# Patient Record
Sex: Female | Born: 1940 | Race: White | Hispanic: No | Smoking: Never smoker
Health system: Southern US, Community
[De-identification: ages and names within clinical notes are randomized; demographics above are authoritative.]

## PROBLEM LIST (undated history)

## (undated) DIAGNOSIS — K449 Diaphragmatic hernia without obstruction or gangrene: Secondary | ICD-10-CM

## (undated) DIAGNOSIS — I10 Essential (primary) hypertension: Secondary | ICD-10-CM

## (undated) DIAGNOSIS — Z87440 Personal history of urinary (tract) infections: Secondary | ICD-10-CM

## (undated) DIAGNOSIS — K5790 Diverticulosis of intestine, part unspecified, without perforation or abscess without bleeding: Secondary | ICD-10-CM

## (undated) DIAGNOSIS — E079 Disorder of thyroid, unspecified: Secondary | ICD-10-CM

## (undated) DIAGNOSIS — IMO0002 Reserved for concepts with insufficient information to code with codable children: Secondary | ICD-10-CM

## (undated) DIAGNOSIS — M1990 Unspecified osteoarthritis, unspecified site: Secondary | ICD-10-CM

## (undated) DIAGNOSIS — R32 Unspecified urinary incontinence: Secondary | ICD-10-CM

## (undated) DIAGNOSIS — K224 Dyskinesia of esophagus: Secondary | ICD-10-CM

## (undated) DIAGNOSIS — R51 Headache: Secondary | ICD-10-CM

## (undated) DIAGNOSIS — K469 Unspecified abdominal hernia without obstruction or gangrene: Secondary | ICD-10-CM

## (undated) DIAGNOSIS — C50919 Malignant neoplasm of unspecified site of unspecified female breast: Secondary | ICD-10-CM

## (undated) DIAGNOSIS — R011 Cardiac murmur, unspecified: Secondary | ICD-10-CM

## (undated) DIAGNOSIS — K219 Gastro-esophageal reflux disease without esophagitis: Secondary | ICD-10-CM

## (undated) DIAGNOSIS — E039 Hypothyroidism, unspecified: Secondary | ICD-10-CM

## (undated) DIAGNOSIS — J189 Pneumonia, unspecified organism: Secondary | ICD-10-CM

## (undated) HISTORY — DX: Diverticulosis of intestine, part unspecified, without perforation or abscess without bleeding: K57.90

## (undated) HISTORY — DX: Dyskinesia of esophagus: K22.4

## (undated) HISTORY — PX: KNEE SURGERY: SHX244

## (undated) HISTORY — PX: BREAST LUMPECTOMY: SHX2

## (undated) HISTORY — DX: Unspecified osteoarthritis, unspecified site: M19.90

## (undated) HISTORY — DX: Diaphragmatic hernia without obstruction or gangrene: K44.9

## (undated) HISTORY — DX: Disorder of thyroid, unspecified: E07.9

## (undated) HISTORY — PX: HIATAL HERNIA REPAIR: SHX195

## (undated) HISTORY — PX: TONSILLECTOMY: SUR1361

## (undated) HISTORY — DX: Reserved for concepts with insufficient information to code with codable children: IMO0002

## (undated) HISTORY — PX: APPENDECTOMY: SHX54

## (undated) HISTORY — DX: Essential (primary) hypertension: I10

## (undated) HISTORY — PX: OTHER SURGICAL HISTORY: SHX169

## (undated) HISTORY — PX: BRONCHOSCOPY: SUR163

## (undated) HISTORY — PX: EYE SURGERY: SHX253

## (undated) HISTORY — DX: Unspecified urinary incontinence: R32

## (undated) HISTORY — DX: Malignant neoplasm of unspecified site of unspecified female breast: C50.919

## (undated) HISTORY — DX: Unspecified abdominal hernia without obstruction or gangrene: K46.9

---

## 2010-04-27 ENCOUNTER — Encounter: Admission: RE | Admit: 2010-04-27 | Discharge: 2010-04-27 | Payer: Self-pay | Admitting: Radiology

## 2010-05-09 ENCOUNTER — Encounter: Admission: RE | Admit: 2010-05-09 | Discharge: 2010-05-09 | Payer: Self-pay | Admitting: General Surgery

## 2010-05-09 DIAGNOSIS — C50919 Malignant neoplasm of unspecified site of unspecified female breast: Secondary | ICD-10-CM

## 2010-05-09 HISTORY — DX: Malignant neoplasm of unspecified site of unspecified female breast: C50.919

## 2010-05-11 ENCOUNTER — Ambulatory Visit (HOSPITAL_BASED_OUTPATIENT_CLINIC_OR_DEPARTMENT_OTHER): Admission: RE | Admit: 2010-05-11 | Discharge: 2010-05-11 | Payer: Self-pay | Admitting: General Surgery

## 2010-05-25 ENCOUNTER — Ambulatory Visit: Payer: Self-pay | Admitting: Oncology

## 2010-06-07 ENCOUNTER — Ambulatory Visit: Admission: RE | Admit: 2010-06-07 | Discharge: 2010-08-17 | Payer: Self-pay | Admitting: Radiation Oncology

## 2010-06-07 LAB — COMPREHENSIVE METABOLIC PANEL
AST: 20 U/L (ref 0–37)
Albumin: 4.1 g/dL (ref 3.5–5.2)
Alkaline Phosphatase: 66 U/L (ref 39–117)
CO2: 28 mEq/L (ref 19–32)
Chloride: 104 mEq/L (ref 96–112)
Glucose, Bld: 36 mg/dL — CL (ref 70–99)
Potassium: 4.5 mEq/L (ref 3.5–5.3)
Sodium: 141 mEq/L (ref 135–145)
Total Bilirubin: 0.4 mg/dL (ref 0.3–1.2)

## 2010-06-07 LAB — CBC WITH DIFFERENTIAL/PLATELET
BASO%: 1 % (ref 0.0–2.0)
Basophils Absolute: 0 10*3/uL (ref 0.0–0.1)
EOS%: 3.6 % (ref 0.0–7.0)
Eosinophils Absolute: 0.2 10*3/uL (ref 0.0–0.5)
LYMPH%: 29.6 % (ref 14.0–49.7)
MCH: 29.7 pg (ref 25.1–34.0)
MCHC: 33.3 g/dL (ref 31.5–36.0)
MCV: 89.3 fL (ref 79.5–101.0)
NEUT#: 2.7 10*3/uL (ref 1.5–6.5)
RBC: 4.89 10*6/uL (ref 3.70–5.45)
WBC: 4.8 10*3/uL (ref 3.9–10.3)

## 2010-06-07 LAB — CANCER ANTIGEN 27.29: CA 27.29: 26 U/mL (ref 0–39)

## 2010-06-24 ENCOUNTER — Ambulatory Visit: Payer: Self-pay | Admitting: Oncology

## 2010-09-26 ENCOUNTER — Ambulatory Visit: Payer: Self-pay | Admitting: Oncology

## 2010-09-27 LAB — CBC WITH DIFFERENTIAL/PLATELET
Eosinophils Absolute: 0.2 10*3/uL (ref 0.0–0.5)
HGB: 13.2 g/dL (ref 11.6–15.9)
LYMPH%: 24.1 % (ref 14.0–49.7)
MCH: 30 pg (ref 25.1–34.0)
MONO#: 0.3 10*3/uL (ref 0.1–0.9)
MONO%: 9.2 % (ref 0.0–14.0)
NEUT#: 2.3 10*3/uL (ref 1.5–6.5)
NEUT%: 61.8 % (ref 38.4–76.8)
Platelets: 186 10*3/uL (ref 145–400)
WBC: 3.7 10*3/uL — ABNORMAL LOW (ref 3.9–10.3)
lymph#: 0.9 10*3/uL (ref 0.9–3.3)

## 2010-09-28 LAB — COMPREHENSIVE METABOLIC PANEL
ALT: 12 U/L (ref 0–35)
AST: 19 U/L (ref 0–37)
Albumin: 3.8 g/dL (ref 3.5–5.2)
BUN: 19 mg/dL (ref 6–23)
Calcium: 9 mg/dL (ref 8.4–10.5)
Creatinine, Ser: 0.93 mg/dL (ref 0.40–1.20)
Potassium: 4.1 mEq/L (ref 3.5–5.3)

## 2010-12-23 LAB — POCT I-STAT, CHEM 8
Calcium, Ion: 1.15 mmol/L (ref 1.12–1.32)
Chloride: 108 mEq/L (ref 96–112)
Creatinine, Ser: 1 mg/dL (ref 0.4–1.2)
HCT: 46 % (ref 36.0–46.0)
Hemoglobin: 15.6 g/dL — ABNORMAL HIGH (ref 12.0–15.0)
Potassium: 4.1 mEq/L (ref 3.5–5.1)
Sodium: 141 mEq/L (ref 135–145)
TCO2: 24 mmol/L (ref 0–100)

## 2010-12-27 ENCOUNTER — Other Ambulatory Visit: Payer: Self-pay | Admitting: Family Medicine

## 2010-12-27 ENCOUNTER — Ambulatory Visit
Admission: RE | Admit: 2010-12-27 | Discharge: 2010-12-27 | Disposition: A | Discharge status: Home / Self Care | Payer: Medicare Other | Source: Ambulatory Visit | Attending: Family Medicine | Admitting: Family Medicine

## 2010-12-27 DIAGNOSIS — R911 Solitary pulmonary nodule: Secondary | ICD-10-CM

## 2010-12-27 MED ORDER — IOHEXOL 300 MG/ML  SOLN
75.0000 mL | Freq: Once | INTRAMUSCULAR | Status: AC | PRN
  Administered 2010-12-27: 75 mL via INTRAVENOUS

## 2010-12-28 ENCOUNTER — Telehealth: Payer: Self-pay | Admitting: Internal Medicine

## 2010-12-28 NOTE — Telephone Encounter (Signed)
Spoke with TD and ok to schedule pt with Dr. Vassie Loll tomorrow at [facility] with Morrie Sheldon at [facility] family practice and advised of appt time and date. She will notify the pt. I will have Raliegh Scarlet add pt to schedule. Carron Curie, MA

## 2010-12-29 ENCOUNTER — Encounter: Payer: Self-pay | Admitting: Pulmonary Disease

## 2010-12-29 ENCOUNTER — Ambulatory Visit (INDEPENDENT_AMBULATORY_CARE_PROVIDER_SITE_OTHER): Payer: Medicare Other | Admitting: Pulmonary Disease

## 2010-12-29 VITALS — BP 120/78 | HR 116 | Temp 98.7°F | Ht 61.0 in | Wt 173.2 lb

## 2010-12-29 DIAGNOSIS — J189 Pneumonia, unspecified organism: Secondary | ICD-10-CM | POA: Insufficient documentation

## 2010-12-29 NOTE — Progress Notes (Signed)
  Subjective:    Patient ID: Joan Weaver, female    DOB: 10-22-1940, 70 y.o.   MRN: [id number]  HPI Pleasant [name], never smoker for evaluaiton of pneumonia since Feb '12 not responding to treatment. Recently diagnosed with breast CA in July '11, underwent lumpectomy ([facility]) followed by RT , last nov'11.  Presented 11/30/10 with 2 weeks of fatigue, cough with green phlegm, diarrhea & 1 episode of BRBPR, CXR s/o LUL pna >> given rocephin 1 gm IM & avelox x 10 ds FU CXR 3/19 s/o LUL cavitation & new RUL infiltrate 3/21 CT chest  >> 39 x 47 mm LUL consolidation, additional focus RLL sup segment 26 x 20 mm, scattered small ground glass attenuation in pulm nodules She c/o continued fevers, cough, night sweats & shotrness of breath  Review of Systems  Constitutional: Positive for diaphoresis and appetite change. Negative for fever, chills and unexpected weight change.  HENT: Negative for mouth sores, voice change and postnasal drip.   Eyes: Negative for discharge.  Respiratory: Positive for cough and wheezing. Negative for chest tightness and shortness of breath.   Cardiovascular: Negative for chest pain, palpitations and leg swelling.  Gastrointestinal: Positive for diarrhea. Negative for abdominal pain.  Genitourinary: Negative for dysuria and frequency.  Musculoskeletal: Negative for joint swelling.  Skin: Negative for rash.  Neurological: Negative for syncope and headaches.  Psychiatric/Behavioral: Negative for confusion. The patient is not nervous/anxious.        Objective:   Physical Exam Gen. Pleasant, well-nourished, in no distress,  normal affect,  anxious ENT - no lesions, no post nasal drip Neck: No JVD, no thyromegaly, no carotid bruits Lungs: no use of accessory muscles, no dullness to percussion, rt suprascapular rales, no rhonchi  Cardiovascular: Rhythm regular, heart sounds  normal, no murmurs, no peripheral edema Abdomen: soft and non-tender, no hepatosplenomegaly, BS  normal. Musculoskeletal: No deformities, no cyanosis or clubbing Neuro:  alert, non focal Skin:  Warm, no lesions/ rash     Assessment & Plan:

## 2010-12-29 NOTE — Assessment & Plan Note (Signed)
Unclear infectious or inflammatory. Did not respond to fluoroquinolone. DD includes resitatnt organism or inflamm etiology such as BOOP or radiation pneumonitis, doubt aspiration - no risk factors, doubt  Will proceed with bronchoscopy The risks of the procedure including coughing, bleeding and the small chance of lung puncture requiring chest tube were discussed in great detail. The benefits & alternatives including serial follow up were also discussed. Will also chk for afb, fungal & cytology If negative, will treat empirically with steroids

## 2010-12-29 NOTE — Patient Instructions (Signed)
Bronchoscopy arranged for Friday 3/23 - 7am - Go to 1st floor - ADMITTING to register at [facility] Nothing to eat after midnight tonight The risks of the procedure including coughing, bleeding and the small chance of lung puncture requiring chest tube were discussed in great detail. The benefits & alternatives including serial follow up were also discussed. Arrange FU in 1 week Stay on antibiotic until next visit We will call you with results of bronchoscopy & discuss about need for steroids

## 2010-12-30 ENCOUNTER — Other Ambulatory Visit: Payer: Self-pay | Admitting: Pulmonary Disease

## 2010-12-30 ENCOUNTER — Inpatient Hospital Stay (HOSPITAL_COMMUNITY): Payer: Medicare Other

## 2010-12-30 ENCOUNTER — Inpatient Hospital Stay (HOSPITAL_COMMUNITY)
Admission: AD | Admit: 2010-12-30 | Discharge: 2011-01-01 | DRG: 199 | Disposition: A | Discharge status: Home / Self Care | Payer: Medicare Other | Source: Ambulatory Visit | Attending: Pulmonary Disease | Admitting: Pulmonary Disease

## 2010-12-30 ENCOUNTER — Ambulatory Visit (HOSPITAL_COMMUNITY)
Admission: RE | Admit: 2010-12-30 | Discharge: 2010-12-30 | Disposition: A | Discharge status: Home / Self Care | Payer: Medicare Other | Source: Ambulatory Visit | Attending: Pulmonary Disease | Admitting: Pulmonary Disease

## 2010-12-30 DIAGNOSIS — Z923 Personal history of irradiation: Secondary | ICD-10-CM

## 2010-12-30 DIAGNOSIS — J984 Other disorders of lung: Secondary | ICD-10-CM

## 2010-12-30 DIAGNOSIS — C50919 Malignant neoplasm of unspecified site of unspecified female breast: Secondary | ICD-10-CM | POA: Diagnosis present

## 2010-12-30 DIAGNOSIS — I498 Other specified cardiac arrhythmias: Secondary | ICD-10-CM | POA: Diagnosis not present

## 2010-12-30 DIAGNOSIS — Y9210 Unspecified residential institution as the place of occurrence of the external cause: Secondary | ICD-10-CM | POA: Diagnosis present

## 2010-12-30 DIAGNOSIS — J95811 Postprocedural pneumothorax: Secondary | ICD-10-CM

## 2010-12-30 DIAGNOSIS — J189 Pneumonia, unspecified organism: Secondary | ICD-10-CM | POA: Diagnosis present

## 2010-12-30 DIAGNOSIS — E039 Hypothyroidism, unspecified: Secondary | ICD-10-CM | POA: Diagnosis present

## 2010-12-30 DIAGNOSIS — I10 Essential (primary) hypertension: Secondary | ICD-10-CM | POA: Diagnosis present

## 2010-12-30 DIAGNOSIS — Y849 Medical procedure, unspecified as the cause of abnormal reaction of the patient, or of later complication, without mention of misadventure at the time of the procedure: Secondary | ICD-10-CM | POA: Diagnosis present

## 2010-12-30 LAB — BASIC METABOLIC PANEL
CO2: 25 mEq/L (ref 19–32)
Calcium: 8.6 mg/dL (ref 8.4–10.5)
Chloride: 104 mEq/L (ref 96–112)
Creatinine, Ser: 0.77 mg/dL (ref 0.4–1.2)
GFR calc non Af Amer: >60 mL/min (ref >60)

## 2010-12-30 LAB — SEDIMENTATION RATE: Sed Rate: 71 mm/hr — ABNORMAL HIGH (ref 0–22)

## 2010-12-30 LAB — PNEUMOCYSTIS JIROVECI SMEAR BY DFA

## 2010-12-30 LAB — CBC
HCT: 35.8 % — ABNORMAL LOW (ref 36.0–46.0)
Platelets: 354 10*3/uL (ref 150–400)
RBC: 4.12 MIL/uL (ref 3.87–5.11)
WBC: 9.4 10*3/uL (ref 4.0–10.5)

## 2010-12-30 LAB — MRSA PCR SCREENING: MRSA by PCR: NEGATIVE

## 2010-12-30 NOTE — Op Note (Signed)
  Joan Weaver, Joan Weaver                 ACCOUNT NO.:  [id number]  MEDICAL RECORD NO.:  [id number]           PATIENT TYPE:  O  LOCATION:  XRAY                         FACILITY:  [facility]  PHYSICIAN:  Oretha Milch, MD      DATE OF BIRTH:  [birth year]  DATE OF PROCEDURE: DATE OF DISCHARGE:                              OPERATIVE REPORT   PROCEDURE PERFORMED:  Video bronchoscopy with biopsies.  INDICATIONS FOR PROCEDURE:  Left upper lobe and right lower lobe superior segment consolidation in this 70 year old never smoker with recent radiation status post breast cancer, diagnosed last in December 2011 and not responding to antibiotics.  Written informed consent was obtained from the patient prior to the procedure.  Rest of the procedure including coughing, bleeding and a small chance of lung puncture, requiring a chest tube, were discussed in detail and she evidenced understanding.  PROCEDURE IN DETAIL:  3 mg Versed and 75 mcg of fentanyl were used in divided doses during procedure.  Bronchoscope was introduced from the left naris.  Upper airway appeared normal.  Vocal cords showed normal appearance and motion.  The tracheobronchial tree was then inspected up to the subsegmental level.  No endobronchial lesions were noted. Minimal phlegm was noted in the left and right upper lobe.  Attention was then turned to the left upper lobe.  Bronchoalveolar lavage and brushings were obtained from left upper lobe anterior segment. Transbronchial biopsies x 2 were obtained from the anterior segment and the apical posterior segment under fluoroscopy.  The patient tolerated procedure well with minimal bleeding.  She was awake right after procedure.  Chest x-ray will be performed to rule out presence of pneumothorax.     Oretha Milch, MD     RVA/MEDQ  D:  12/30/2010  T:  12/30/2010  Job:  [id number]  Electronically Signed by Cyril Mourning MD on 12/30/2010 04:54:09 PM

## 2010-12-31 ENCOUNTER — Inpatient Hospital Stay (HOSPITAL_COMMUNITY): Payer: Medicare Other

## 2010-12-31 LAB — CBC
Hemoglobin: 11.2 g/dL — ABNORMAL LOW (ref 12.0–15.0)
MCV: 87.2 fL (ref 78.0–100.0)
Platelets: 326 10*3/uL (ref 150–400)
RBC: 4.14 MIL/uL (ref 3.87–5.11)
WBC: 6 10*3/uL (ref 4.0–10.5)

## 2010-12-31 LAB — PROCALCITONIN: Procalcitonin: <0.1 ng/mL

## 2010-12-31 LAB — BASIC METABOLIC PANEL
CO2: 27 mEq/L (ref 19–32)
Chloride: 104 mEq/L (ref 96–112)
GFR calc Af Amer: >60 mL/min (ref >60)
Potassium: 4.7 mEq/L (ref 3.5–5.1)

## 2011-01-01 ENCOUNTER — Inpatient Hospital Stay (HOSPITAL_COMMUNITY): Payer: Medicare Other

## 2011-01-02 ENCOUNTER — Telehealth: Payer: Self-pay | Admitting: Pulmonary Disease

## 2011-01-02 MED ORDER — PREDNISONE 10 MG PO TABS: 40.0000 mg | ORAL_TABLET | Freq: Every day | ORAL | Status: DC

## 2011-01-02 NOTE — Telephone Encounter (Signed)
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

## 2011-01-02 NOTE — Telephone Encounter (Signed)
Let her know that biopsy showed changes of radiation pneumonitis

## 2011-01-04 LAB — LEGIONELLA PROFILE(CULTURE+DFA/SMEAR): Legionella Antigen (DFA): NEGATIVE

## 2011-01-06 ENCOUNTER — Encounter: Payer: Self-pay | Admitting: Pulmonary Disease

## 2011-01-06 ENCOUNTER — Ambulatory Visit (INDEPENDENT_AMBULATORY_CARE_PROVIDER_SITE_OTHER): Payer: Medicare Other | Admitting: Pulmonary Disease

## 2011-01-06 ENCOUNTER — Ambulatory Visit (INDEPENDENT_AMBULATORY_CARE_PROVIDER_SITE_OTHER)
Admission: RE | Admit: 2011-01-06 | Discharge: 2011-01-06 | Disposition: A | Discharge status: Home / Self Care | Payer: Medicare Other | Source: Ambulatory Visit | Attending: Pulmonary Disease | Admitting: Pulmonary Disease

## 2011-01-06 VITALS — BP 110/74 | HR 111 | Temp 98.0°F | Ht 61.0 in | Wt 172.6 lb

## 2011-01-06 DIAGNOSIS — J95811 Postprocedural pneumothorax: Secondary | ICD-10-CM | POA: Insufficient documentation

## 2011-01-06 DIAGNOSIS — J189 Pneumonia, unspecified organism: Secondary | ICD-10-CM

## 2011-01-06 MED ORDER — PREDNISONE 10 MG PO TABS: ORAL_TABLET | ORAL | Status: AC

## 2011-01-06 NOTE — Patient Instructions (Signed)
You have radiation induced pneumonia Chest xray today Prednisone 10 mg tabs - Take 4 tabs  daily with food x 4 days, then 3 tabs daily x 4 days, then 2 tabs daily until seen again Take DELSYM 2 tsp three times daily as needed for cough Apply neosporin cram/ powder to wound site

## 2011-01-06 NOTE — Assessment & Plan Note (Addendum)
Reviewed bscopy data >> cx neg for afb, fungal TBBX s/o radiation pneumonitis , no evidence of BOOP Start prednisone 40 mg & taper down to 20 mg  - stay on this dose until subjective improvement - anticipate 2-3 months of therapy, if tolerated Side effects discussed Delsym prn cough

## 2011-01-06 NOTE — Telephone Encounter (Signed)
Pt informed during today's OV. Julaine Hua, CMA

## 2011-01-06 NOTE — Progress Notes (Signed)
  Subjective:    Patient ID: Joan Weaver, female    DOB: Jun 02, 1941, 70 y.o.   MRN: [id number]  HPI Pleasant [name], never smoker for evaluaiton of pneumonia since Feb '12 not responding to treatment. Recently diagnosed with breast CA in July '11, underwent lumpectomy ([facility]) followed by RT , last nov'11.  Presented 11/30/10 with 2 weeks of fatigue, cough with green phlegm, diarrhea & 1 episode of BRBPR, CXR s/o LUL pna >> given rocephin 1 gm IM & avelox x 10 ds FU CXR 3/19 s/o LUL cavitation & new RUL infiltrate 3/21 CT chest  >> 39 x 47 mm LUL consolidation, additional focus RLL sup segment 26 x 20 mm, scattered small ground glass attenuation in pulm nodules  01/06/2011 Discussed bronchoscopy results, reviewed last CXR She c/o continued fevers, cough, night sweats & shotrness of breath    Review of Systems Review of Systems  Constitutional: Positive for diaphoresis and appetite change. Negative for fever, chills and unexpected weight change.  HENT: Negative for mouth sores, voice change and postnasal drip.   Eyes: Negative for discharge.  Respiratory: Positive for cough and wheezing. Negative for chest tightness and shortness of breath.   Cardiovascular: Negative for chest pain, palpitations and leg swelling.  Gastrointestinal: Positive for diarrhea. Negative for abdominal pain.  Genitourinary: Negative for dysuria and frequency.  Musculoskeletal: Negative for joint swelling.  Skin: Negative for rash.  Neurological: Negative for syncope and headaches.  Psychiatric/Behavioral: Negative for confusion. The patient is not nervous/anxious.        Objective:   Physical Exam  Physical Exam Gen. Pleasant, well-nourished, in no distress,  normal affect,  anxious ENT - no lesions, no post nasal drip Neck: No JVD, no thyromegaly, no carotid bruits Lungs: no use of accessory muscles, no dullness to percussion, lt suprascapular rales, no rhonchi , chest tube site - no  purulence Cardiovascular: Rhythm regular, heart sounds  normal, no murmurs, no peripheral edema Abdomen: soft and non-tender, no hepatosplenomegaly, BS normal. Musculoskeletal: No deformities, no cyanosis or clubbing Neuro:  alert, non focal Skin:  Warm, no lesions/ rash       Assessment & Plan:

## 2011-01-06 NOTE — Assessment & Plan Note (Signed)
FU CXR today - 5% residual pnthx on last CXR on 3/25 after removing tube but good air entry on exam today Neosporin at chest tube site

## 2011-01-20 ENCOUNTER — Ambulatory Visit (INDEPENDENT_AMBULATORY_CARE_PROVIDER_SITE_OTHER): Payer: Medicare Other | Admitting: Adult Health

## 2011-01-20 ENCOUNTER — Ambulatory Visit (INDEPENDENT_AMBULATORY_CARE_PROVIDER_SITE_OTHER)
Admission: RE | Admit: 2011-01-20 | Discharge: 2011-01-20 | Disposition: A | Discharge status: Home / Self Care | Payer: Medicare Other | Source: Ambulatory Visit | Attending: Adult Health | Admitting: Adult Health

## 2011-01-20 ENCOUNTER — Other Ambulatory Visit (INDEPENDENT_AMBULATORY_CARE_PROVIDER_SITE_OTHER): Payer: Medicare Other

## 2011-01-20 ENCOUNTER — Encounter: Payer: Self-pay | Admitting: Adult Health

## 2011-01-20 VITALS — BP 130/84 | HR 77 | Temp 97.8°F | Ht 61.0 in | Wt 176.4 lb

## 2011-01-20 DIAGNOSIS — J189 Pneumonia, unspecified organism: Secondary | ICD-10-CM

## 2011-01-20 DIAGNOSIS — J95811 Postprocedural pneumothorax: Secondary | ICD-10-CM

## 2011-01-20 MED ORDER — PREDNISONE 10 MG PO TABS: ORAL_TABLET | ORAL | Status: DC

## 2011-01-20 NOTE — Progress Notes (Signed)
  Subjective:    Patient ID: Joan Weaver, female    DOB: 1940-10-30, 70 y.o.   MRN: [id number]  HPI  Pleasant [name], never smoker for evaluaiton of pneumonia since Feb '12 not responding to treatment. Recently diagnosed with breast CA in July '11, underwent lumpectomy ([facility]) followed by RT , last nov'11.  Presented 11/30/10 with 2 weeks of fatigue, cough with green phlegm, diarrhea & 1 episode of BRBPR, CXR s/o LUL pna >> given rocephin 1 gm IM & avelox x 10 ds FU CXR 3/19 s/o LUL cavitation & new RUL infiltrate 3/21 CT chest  >> 39 x 47 mm LUL consolidation, additional focus RLL sup segment 26 x 20 mm, scattered small ground glass attenuation in pulm nodules  01/06/2011 Discussed bronchoscopy results, reviewed last CXR She c/o continued fevers, cough, night sweats & shotrness of breath>decreased prednisone to 20mg   FOB path c/w with radiation pneumonitis-neg for BOOP, CXR showed resolved Pneumothorax   01/20/2011 Follow up  Pt returns today for 2 week follow up. She is feeling much better with decreased cough and dyspnea. Still gets winded with prolonged walking but energy level is picking up slightly. Cough is almost gone. No n/v/d. She has decreased Prednisone to 20mg  daily.  CXR last ov showed resolved Pnuemothorax. CHest wall pain is resolved.  Fever and chills/night sweats are gone.     Review of Systems  Review of Systems  Constitutional:neg for fever, chills and unexpected weight change.  HENT: Negative for mouth sores, voice change and postnasal drip.   Eyes: Negative for discharge.  Respiratory: Decreased cough. Neg wheezing. Negative for chest tightness and shortness of breath.   Cardiovascular: Negative for chest pain, palpitations and leg swelling.  Gastrointestinal: Neg diarrhea. Negative for abdominal pain.  Genitourinary: Negative for dysuria and frequency.  Musculoskeletal: Negative for joint swelling.  Skin: Negative for rash.  Neurological: Negative for  syncope and headaches.  Psychiatric/Behavioral: Negative for confusion. The patient is not nervous/anxious.        Objective:   Physical Exam  Gen. Pleasant, well-nourished, in no distress,  normal affect,  anxious ENT - no lesions, no post nasal drip Neck: No JVD, no thyromegaly, no carotid bruits Lungs: no use of accessory muscles, no dullness to percussion, lt suprascapular rales, no rhonchi ,  Cardiovascular: Rhythm regular, heart sounds  normal, no murmurs, no peripheral edema Abdomen: soft and non-tender, no hepatosplenomegaly, BS normal. Musculoskeletal: No deformities, no cyanosis or clubbing Neuro:  alert, non focal Skin:  Warm, no lesions/ rash       Assessment & Plan:

## 2011-01-20 NOTE — Patient Instructions (Signed)
Continue on Prednisone 20mg  daily for 2 weeks then alternate 20mg  and 10mg  and hold at this dose follow up Dr. Vassie Loll  In 3-4 weeks  I will call with xray results.

## 2011-01-20 NOTE — Assessment & Plan Note (Addendum)
Radiation Pneumonitis- clinically improving bscopy data >> cx neg for afb, fungal TBBX s/o radiation pneumonitis , no evidence of BOOP Previous ESR 71.  Will repeat xray and ESR.  Taper prednisone slowly with plan of steroids x 2-3 months  Plan  Continue on Prednisone 20mg  daily for 2 weeks then alternate 20mg  and 10mg  and hold at this dose follow up Dr. Vassie Loll  In 3-4 weeks  I will call with xray /labs results.

## 2011-01-20 NOTE — Assessment & Plan Note (Signed)
Resolved on xray last ov.

## 2011-01-30 LAB — FUNGUS CULTURE W SMEAR: Fungal Smear: NONE SEEN

## 2011-02-01 NOTE — Discharge Summary (Signed)
NAMEMISSY, BAKSH                 ACCOUNT NO.:  [id number]  MEDICAL RECORD NO.:  [id number]           PATIENT TYPE:  O  LOCATION:  XRAY                         FACILITY:  [facility]  PHYSICIAN:  Oley Balm. Sung Amabile, MD   DATE OF BIRTH:  12/13/1940  DATE OF ADMISSION:  12/30/2010 DATE OF DISCHARGE:  01/01/2011                              DISCHARGE SUMMARY   FINAL DIAGNOSES: 1. Iatrogenic pneumothorax following bronchoscopy. 2. Pain. 3. Pneumonia.  PROCEDURES:  Left chest tube.  This was placed on December 30, 2010, removed on January 01, 2011.  LABORATORY DATA:  Legionella profile was negative by bronchioalveolar lavage.  ANA was negative, this on December 30, 2010.  Fungal was negative on December 30, 2010.  BAL on December 30, 2010, demonstrated nonpathogenic oropharyngeal-type flora.  Procalcitonin on December 31, 2010, was less than 0.10.  Pneumocystis was negative on December 30, 2010.  ESR was 71.  BRIEF HISTORY:  A 70 year old female patient seen in the outpatient setting by Dr. Vassie Loll in consultation for evaluation of abnormal chest x- ray, which was being treated as a pneumonia in the outpatient setting since February 2012 without improvement.  She underwent a diagnostic fiberoptic bronchoscopy on December 30, 2010, and postoperatively was found to have a large left-sided pneumothorax.  She was monitored postoperatively in the endoscopy suite, however, the pneumothorax increased in size, she therefore was admitted to the step-down unit for chest tube insertion and decompression of pneumothorax.  HOSPITAL COURSE BY DISCHARGE DIAGNOSES: 1. Iatrogenic pneumothorax following bronchoscopy.  Ms. Sculley     underwent left-sided thoracostomy tube placement on hospital admit     day on December 29, 2009.  Following that she had resolution of her     pneumothorax, with improvement of her symptoms.  Chest tube was     successfully removed on January 01, 2011, with less than 5%     pneumothorax on the left, this  is following removal of the chest     tube on the 24th.  She was discharged to home with instructions to     continue antibiotics for treatment of presumed pneumonia.  She had     been started on Levaquin in the outpatient setting as well as     instructions to follow with Dr. Vassie Loll on the 29th.  Her discharge medications are as follows: 1. Vitamin B6, 100 mg p.o. daily. 2. Zantac 150 mg p.o. nightly. 3. Fish oil 1000 mg p.o. daily. 4. Nexium 40 mg daily. 5. Vitamin E 400 units daily. 6. Flaxseed oil 1 cap daily. 7. Multivitamin daily. 8. Magnesium 100 mg p.o. daily. 9. Loratadine 10 mg p.o. daily. 10.Etodolac 500 mg p.o. daily. 11.Cranberry over-the-counter 1 tab daily. 12.Vitamin D3, 1000 units daily. 13.Calcium 1 tablet daily. 14.Extra Strength Tylenol 500 mg p.o. daily. 15.Tamoxifen 20 mg daily. 16.Levothyroxine 25 mcg daily. 17.Levofloxacin 750 mg p.o. daily until gone. 18.Fluconazole 150 mg x1. 19.Advair 250/50 one puff b.i.d.  DISPOSITION:  Ms. Nicholls met maximum benefit from inpatient care.  She is medically cleared for discharge and was discharged to home with follow with Dr. Cyril Mourning  on the 29th.     Zenia Resides, NP   ______________________________ Oley Balm. Sung Amabile, MD    PB/MEDQ  D:  01/23/2011  T:  01/24/2011  Job:  [id number]  Electronically Signed by Zenia Resides NP on 01/31/2011 09:05:44 AM Electronically Signed by Billy Fischer MD on 02/01/2011 12:59:44 AM

## 2011-02-03 ENCOUNTER — Encounter (INDEPENDENT_AMBULATORY_CARE_PROVIDER_SITE_OTHER): Payer: Self-pay | Admitting: General Surgery

## 2011-02-08 ENCOUNTER — Encounter: Payer: Self-pay | Admitting: Pulmonary Disease

## 2011-02-10 ENCOUNTER — Ambulatory Visit (INDEPENDENT_AMBULATORY_CARE_PROVIDER_SITE_OTHER): Payer: Medicare Other | Admitting: Pulmonary Disease

## 2011-02-10 ENCOUNTER — Encounter: Payer: Self-pay | Admitting: Pulmonary Disease

## 2011-02-10 VITALS — BP 134/78 | HR 78 | Temp 97.8°F | Ht 61.0 in | Wt 180.2 lb

## 2011-02-10 DIAGNOSIS — J189 Pneumonia, unspecified organism: Secondary | ICD-10-CM

## 2011-02-10 MED ORDER — PREDNISONE 5 MG PO TABS: ORAL_TABLET | ORAL | Status: DC

## 2011-02-10 NOTE — Patient Instructions (Signed)
On may 15 th - drop to 10 mg daily On June 1st - drop to 5 mg daily On June 15th -drop to 5 mg daily STOP on July 1st

## 2011-02-10 NOTE — Progress Notes (Signed)
  Subjective:    Patient ID: Joan Weaver, female    DOB: August 03, 1941, 70 y.o.   MRN: [id number]  HPI Oncologist : Donnie Coffin,  Dr Mitzi Hansen  Pleasant [name], never smoker for evaluaiton of pneumonia since Feb '12 not responding to treatment.  Diagnosed with breast CA in July '11, underwent lumpectomy ([facility]) followed by RT , last nov'11.  Presented 11/30/10 with 2 weeks of fatigue, cough with green phlegm, diarrhea & 1 episode of BRBPR, CXR s/o LUL pna >> given rocephin 1 gm IM & avelox x 10 ds  FU CXR 3/19 s/o LUL cavitation & new RUL infiltrate  3/21 CT chest >> 39 x 47 mm LUL consolidation, additional focus RLL sup segment 26 x 20 mm, scattered small ground glass attenuation in pulm nodules  01/06/2011  Transbronchial biopsy  c/w with radiation pneumonitis-neg for BOOP, CXR showed resolved Pneumothorax  Started on 40 mg prednisone    02/10/2011 She is feeling much better with decreased cough and dyspnea. Still gets winded with prolonged walking but energy level is picking up slightly. Cough is almost gone. No n/v/d. She has decreased Prednisone to 20mg  alternating with 10 mg daily.  CXR last ov showed resolved Pnuemothorax. CHest wall pain is resolved.  Fever and chills/night sweats are gone.      Review of Systems Pt denies any significant  nasal congestion or excess secretions, fever, chills, sweats, unintended wt loss, pleuritic or exertional cp, orthopnea pnd or leg swelling.  Pt also denies any obvious fluctuation in symptoms with weather or environmental change or other alleviating or aggravating factors.    Pt denies any increase in rescue therapy over baseline, denies waking up needing it or having early am exacerbations or coughing/wheezing/ or dyspnea       Objective:   Physical Exam Gen. Pleasant, well-nourished, in no distress ENT - no lesions, no post nasal drip, steroid facies Neck: No JVD, no thyromegaly, no carotid bruits Lungs: no use of accessory muscles, no dullness to  percussion, clear without rales or rhonchi  Cardiovascular: Rhythm regular, heart sounds  normal, no murmurs or gallops, no peripheral edema Musculoskeletal: No deformities, no cyanosis or clubbing         Assessment & Plan:

## 2011-02-12 LAB — AFB CULTURE WITH SMEAR (NOT AT ARMC): Acid Fast Smear: NONE SEEN

## 2011-02-13 NOTE — Assessment & Plan Note (Signed)
Taper steroids to off over next 2 months as outlined

## 2011-03-21 ENCOUNTER — Other Ambulatory Visit: Payer: Self-pay | Admitting: Oncology

## 2011-03-21 ENCOUNTER — Encounter (HOSPITAL_BASED_OUTPATIENT_CLINIC_OR_DEPARTMENT_OTHER): Payer: Medicare Other | Admitting: Oncology

## 2011-03-21 DIAGNOSIS — M810 Age-related osteoporosis without current pathological fracture: Secondary | ICD-10-CM

## 2011-03-21 DIAGNOSIS — K219 Gastro-esophageal reflux disease without esophagitis: Secondary | ICD-10-CM

## 2011-03-21 DIAGNOSIS — C50419 Malignant neoplasm of upper-outer quadrant of unspecified female breast: Secondary | ICD-10-CM

## 2011-03-21 DIAGNOSIS — I10 Essential (primary) hypertension: Secondary | ICD-10-CM

## 2011-03-21 LAB — CBC WITH DIFFERENTIAL/PLATELET
BASO%: 0.3 % (ref 0.0–2.0)
Basophils Absolute: 0 10*3/uL (ref 0.0–0.1)
HCT: 40.2 % (ref 34.8–46.6)
HGB: 13.4 g/dL (ref 11.6–15.9)
LYMPH%: 7.9 % — ABNORMAL LOW (ref 14.0–49.7)
MCH: 29 pg (ref 25.1–34.0)
MCHC: 33.3 g/dL (ref 31.5–36.0)
MONO#: 0.7 10*3/uL (ref 0.1–0.9)
NEUT%: 84.2 % — ABNORMAL HIGH (ref 38.4–76.8)
Platelets: 237 10*3/uL (ref 145–400)
WBC: 10.6 10*3/uL — ABNORMAL HIGH (ref 3.9–10.3)
lymph#: 0.8 10*3/uL — ABNORMAL LOW (ref 0.9–3.3)

## 2011-03-21 LAB — COMPREHENSIVE METABOLIC PANEL
ALT: 10 U/L (ref 0–35)
BUN: 16 mg/dL (ref 6–23)
CO2: 24 mEq/L (ref 19–32)
Calcium: 8.8 mg/dL (ref 8.4–10.5)
Chloride: 103 mEq/L (ref 96–112)
Creatinine, Ser: 1.03 mg/dL (ref 0.50–1.10)
Total Bilirubin: 0.4 mg/dL (ref 0.3–1.2)

## 2011-03-21 LAB — CANCER ANTIGEN 27.29: CA 27.29: 29 U/mL (ref 0–39)

## 2011-03-22 ENCOUNTER — Telehealth: Payer: Self-pay | Admitting: Pulmonary Disease

## 2011-03-22 NOTE — Telephone Encounter (Signed)
Patient decided to accept the opening tomorrow at 2:00pm stated nurse doesn't have to call her back but she did want this information in her chart.Joan Weaver

## 2011-03-23 ENCOUNTER — Encounter: Payer: Self-pay | Admitting: Pulmonary Disease

## 2011-03-23 ENCOUNTER — Ambulatory Visit (INDEPENDENT_AMBULATORY_CARE_PROVIDER_SITE_OTHER): Payer: Medicare Other | Admitting: Pulmonary Disease

## 2011-03-23 ENCOUNTER — Ambulatory Visit (INDEPENDENT_AMBULATORY_CARE_PROVIDER_SITE_OTHER)
Admission: RE | Admit: 2011-03-23 | Discharge: 2011-03-23 | Disposition: A | Discharge status: Home / Self Care | Payer: Medicare Other | Source: Ambulatory Visit | Attending: Pulmonary Disease | Admitting: Pulmonary Disease

## 2011-03-23 VITALS — BP 132/74 | HR 107 | Temp 98.9°F | Ht 61.0 in | Wt 182.6 lb

## 2011-03-23 DIAGNOSIS — J189 Pneumonia, unspecified organism: Secondary | ICD-10-CM

## 2011-03-23 NOTE — Patient Instructions (Signed)
Xray looks OK We will treat as sinusitis Antibiotic x 5 days (z-pak) Use DELSYM cough syrup 2 tsp thrice daily Stay on prednisone 10 mg daily If better, drop to 5 mg again on July 1 st -  On July 15 th take M/W/F OK to stop sep 1 st

## 2011-03-23 NOTE — Progress Notes (Signed)
  Subjective:    Patient ID: Joan Weaver, female    DOB: 06-Jul-1941, 70 y.o.   MRN: [id number]  HPI Oncologist : Donnie Coffin, Dr Mitzi Hansen   [name], never smoker for FU of radiation pneumonitis. She presented with pneumonia since Feb '12 not responding to treatment.  Diagnosed with breast CA in July '11, underwent lumpectomy ([facility]) followed by RT , last nov'11.  Presented 11/30/10 with 2 weeks of fatigue, cough with green phlegm, diarrhea & 1 episode of BRBPR, CXR s/o LUL pna >> given rocephin 1 gm IM & avelox x 10 ds  FU CXR 3/19 s/o LUL cavitation & new RUL infiltrate  3/21 CT chest >> 39 x 47 mm LUL consolidation, additional focus RLL sup segment 26 x 20 mm, scattered small ground glass attenuation in pulm nodules  01/06/2011  Transbronchial biopsy c/w with radiation pneumonitis-neg for BOOP, CXR showed resolved Pneumothorax  Started on 40 mg prednisone   02/10/2011  She is feeling much better with decreased cough and dyspnea. Still gets winded with prolonged walking but energy level is picking up slightly. Cough is almost gone. No n/v/d. She has decreased Prednisone to 20mg  alternating with 10 mg daily.  CXR last ov showed resolved Pnuemothorax. CHest wall pain is resolved.  Fever and chills/night sweats are gone.   03/23/2011 Tapered down to 5 mg on June 1st as planned - started feeling bad followed by sore throat, sinus drainage & increased cough , back up to 10 mg last 3 days without relief. Pt here for acute visit. c/o increased SOB with exertion, non-productive cough. Also c/o T-100 x last 2 days - afebrile today. CXR improved LUL aeration, persistent LLL air space disease.    Review of Systems Pt denies any significant  nasal congestion or excess secretions, fever, chills, sweats, unintended wt loss, pleuritic or exertional cp, orthopnea pnd or leg swelling.  Pt also denies any obvious fluctuation in symptoms with weather or environmental change or other alleviating or aggravating  factors.    Pt denies any increase in rescue therapy over baseline, denies waking up needing it or having early am exacerbations or coughing/wheezing/ or dyspnea      Objective:   Physical Exam Gen. Pleasant, well-nourished, in no distress ENT - no lesions, no post nasal drip Neck: No JVD, no thyromegaly, no carotid bruits Lungs: no use of accessory muscles, no dullness to percussion, lt suprascapular rales, no rhonchi  Cardiovascular: Rhythm regular, heart sounds  normal, no murmurs or gallops, no peripheral edema Musculoskeletal: No deformities, no cyanosis or clubbing          Assessment & Plan:

## 2011-03-23 NOTE — Assessment & Plan Note (Addendum)
Sinus infection vs flare of radiation pneumonitis  due to steroid taper. Antibiotic x 5 days (z-pak) Use DELSYM cough syrup 2 tsp thrice daily Stay on prednisone 10 mg daily If better, drop to 5 mg again on July 1 st - with taper to off by s ep 1 st.

## 2011-03-24 ENCOUNTER — Telehealth: Payer: Self-pay | Admitting: Pulmonary Disease

## 2011-03-24 MED ORDER — AZITHROMYCIN 250 MG PO TABS: 250.0000 mg | ORAL_TABLET | Freq: Every day | ORAL | Status: AC

## 2011-03-24 NOTE — Telephone Encounter (Signed)
Pt was just seen yesterday by RA and pt instructions state ... We will treat as sinusitis  Antibiotic x 5 days (z-pak)  However, pt states z pak wasn't sent to pharamcy.  Therefore, rx sent to pharmacy.  Pt aware.

## 2011-03-28 ENCOUNTER — Encounter (HOSPITAL_BASED_OUTPATIENT_CLINIC_OR_DEPARTMENT_OTHER): Payer: Medicare Other | Admitting: Oncology

## 2011-03-28 DIAGNOSIS — C50419 Malignant neoplasm of upper-outer quadrant of unspecified female breast: Secondary | ICD-10-CM

## 2011-03-28 DIAGNOSIS — Z170 Estrogen receptor positive status [ER+]: Secondary | ICD-10-CM

## 2011-07-06 ENCOUNTER — Ambulatory Visit: Payer: Medicare Other | Admitting: Pulmonary Disease

## 2011-07-07 ENCOUNTER — Ambulatory Visit (INDEPENDENT_AMBULATORY_CARE_PROVIDER_SITE_OTHER): Payer: Medicare Other | Admitting: Pulmonary Disease

## 2011-07-07 ENCOUNTER — Encounter: Payer: Self-pay | Admitting: Pulmonary Disease

## 2011-07-07 ENCOUNTER — Ambulatory Visit: Payer: Medicare Other

## 2011-07-07 VITALS — BP 142/82 | HR 113 | Temp 98.5°F | Ht 62.0 in | Wt 185.8 lb

## 2011-07-07 DIAGNOSIS — J189 Pneumonia, unspecified organism: Secondary | ICD-10-CM

## 2011-07-07 LAB — BASIC METABOLIC PANEL
GFR: 87.7 mL/min (ref >60.00)
Glucose, Bld: 73 mg/dL (ref 70–99)
Potassium: 4 mEq/L (ref 3.5–5.1)
Sodium: 140 mEq/L (ref 135–145)

## 2011-07-07 LAB — CBC WITH DIFFERENTIAL/PLATELET
Eosinophils Relative: 1.2 % (ref 0.0–5.0)
HCT: 39.9 % (ref 36.0–46.0)
Hemoglobin: 13.1 g/dL (ref 12.0–15.0)
Lymphs Abs: 0.7 10*3/uL (ref 0.7–4.0)
Monocytes Relative: 8.4 % (ref 3.0–12.0)
Neutro Abs: 4.5 10*3/uL (ref 1.4–7.7)
RBC: 4.54 Mil/uL (ref 3.87–5.11)
WBC: 5.8 10*3/uL (ref 4.5–10.5)

## 2011-07-07 NOTE — Patient Instructions (Signed)
Blood work today CT scan -schedule Get back on prednisone 5 mg daily

## 2011-07-07 NOTE — Progress Notes (Signed)
  Subjective:    Patient ID: Joan Weaver, female    DOB: 1941/07/25, 70 y.o.   MRN: [id number]  HPI Oncologist : Donnie Coffin, Dr Mitzi Hansen  [name], never smoker for FU of radiation pneumonitis.   Diagnosed with breast CA in July '11, underwent lumpectomy ([facility]) followed by RT , last nov'11.  She presented with pneumonia since Feb '12 not responding to antibiotics. 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  Tapered down to 5 mg on June 1st as planned - started feeling bad followed by sore throat, sinus drainage & increased cough , back up to 10 mg last 3 days without relief.  CXR improved LUL aeration, persistent LLL air space disease.>> pred increased to 20 mg , then tapered back down to off in sep'12. Has been off pred x 8ds now. Pt c/o nasal congestion, PND, productive cough with yellow phlegm, chest tigntness, and wheezing x 3 days. CBC, BMET nml     Review of Systems Patient denies significant dyspnea,cough, hemoptysis,  chest pain, palpitations, pedal edema, orthopnea, paroxysmal nocturnal dyspnea, lightheadedness, nausea, vomiting, abdominal or  leg pains      Objective:   Physical Exam Gen. Pleasant, well-nourished, in no distress ENT - no lesions, no post nasal drip Neck: No JVD, no thyromegaly, no carotid bruits Lungs: no use of accessory muscles, no dullness to percussion, RLL rales, no rhonchi  Cardiovascular: Rhythm regular, heart sounds  normal, no murmurs or gallops, no peripheral edema Musculoskeletal: No deformities, no cyanosis or clubbing       Assessment & Plan:

## 2011-07-07 NOTE — Assessment & Plan Note (Signed)
Clinically behaving like 'inflammatory ' pneumonia  - doubt infection, note WBC ct nml today. Bx did not suggest BOOP more c/w radiation pneumonitis but RLL would not be in field of radiation. Will rpt CT chest - resume 5 mg prednisone - may need higher doses for prolonged period, if she remians symptomatic

## 2011-07-11 ENCOUNTER — Ambulatory Visit (INDEPENDENT_AMBULATORY_CARE_PROVIDER_SITE_OTHER)
Admission: RE | Admit: 2011-07-11 | Discharge: 2011-07-11 | Disposition: A | Discharge status: Home / Self Care | Payer: Medicare Other | Source: Ambulatory Visit | Attending: Pulmonary Disease | Admitting: Pulmonary Disease

## 2011-07-11 ENCOUNTER — Telehealth: Payer: Self-pay | Admitting: Pulmonary Disease

## 2011-07-11 DIAGNOSIS — J189 Pneumonia, unspecified organism: Secondary | ICD-10-CM

## 2011-07-11 NOTE — Telephone Encounter (Signed)
Spoke with pt and notified labs normal, no sign of infection per RA. Pt verbalized understanding.

## 2011-07-26 ENCOUNTER — Other Ambulatory Visit (INDEPENDENT_AMBULATORY_CARE_PROVIDER_SITE_OTHER): Payer: Medicare Other

## 2011-07-26 ENCOUNTER — Ambulatory Visit (INDEPENDENT_AMBULATORY_CARE_PROVIDER_SITE_OTHER)
Admission: RE | Admit: 2011-07-26 | Discharge: 2011-07-26 | Disposition: A | Discharge status: Home / Self Care | Payer: Medicare Other | Source: Ambulatory Visit | Attending: Adult Health | Admitting: Adult Health

## 2011-07-26 ENCOUNTER — Ambulatory Visit (INDEPENDENT_AMBULATORY_CARE_PROVIDER_SITE_OTHER): Payer: Medicare Other | Admitting: Adult Health

## 2011-07-26 ENCOUNTER — Other Ambulatory Visit: Payer: Self-pay | Admitting: Adult Health

## 2011-07-26 ENCOUNTER — Encounter: Payer: Self-pay | Admitting: Adult Health

## 2011-07-26 VITALS — BP 100/70 | HR 86 | Temp 98.1°F | Ht 62.0 in | Wt 183.8 lb

## 2011-07-26 DIAGNOSIS — Z23 Encounter for immunization: Secondary | ICD-10-CM

## 2011-07-26 DIAGNOSIS — J189 Pneumonia, unspecified organism: Secondary | ICD-10-CM

## 2011-07-26 MED ORDER — PREDNISONE 5 MG PO TABS: ORAL_TABLET | ORAL | Status: DC

## 2011-07-26 NOTE — Progress Notes (Signed)
Subjective:    Patient ID: Joan Weaver, female    DOB: Jul 14, 1941, 70 y.o.   MRN: [id number]  HPI  Oncologist : Donnie Coffin, Dr Mitzi Hansen  [name], never smoker for FU of radiation pneumonitis.   Diagnosed with breast CA in July '11, underwent lumpectomy ([facility]) followed by RT , last nov'11.  She presented with pneumonia since Feb '12 not responding to antibiotics. 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  Tapered down to 5 mg on June 1st as planned - started feeling bad followed by sore throat, sinus drainage & increased cough , back up to 10 mg last 3 days without relief.  CXR improved LUL aeration, persistent LLL air space disease.>> pred increased to 20 mg , then tapered back down to off in sep'12. Has been off pred x 8ds now. Pt c/o nasal congestion, PND, productive cough with yellow phlegm, chest tigntness, and wheezing x 3 days. CBC, BMET nml >>restarted on Pred 5mg    07/26/2011 Follow up  No new complaints. c/o still being tired, cough is less with white mucus, has reduced Prednisone to 15mg  . CT chest on 07/11/11 showed Multiple shifting areas of consolidation in both lungs. Areas  of consolidation seen on the prior chest CT have resolved and there are new, similar appearing areas of predominately peripheral consolidation. She was instructed to increase prednisone 20mg  daily  , she did this for a while then 3 days ago she decreased to 15mg  (currently).  Cough is much better.    Review of Systems  Constitutional:   No  weight loss, night sweats,  Fevers, chills,  +fatigue, or  lassitude.  HEENT:   No headaches,  Difficulty swallowing,  Tooth/dental  problems, or  Sore throat,                No sneezing, itching, ear ache,  +nasal congestion, post nasal drip,   CV:  No chest pain,  Orthopnea, PND, swelling in lower extremities, anasarca, dizziness, palpitations, syncope.   GI  No heartburn, indigestion, abdominal pain, nausea, vomiting, diarrhea, change in bowel habits, loss of appetite, bloody stools.   Resp:  No coughing up of blood.  No change in color of mucus.  No wheezing.  No chest wall deformity  Skin: no rash or lesions.  GU: no dysuria, change in color of urine, no urgency or frequency.  No flank pain, no hematuria   MS:  No joint pain or swelling.  No decreased range of motion.     Psych:  No change in mood or affect. No depression or anxiety.  No memory loss.         Objective:   Physical Exam GEN: A/Ox3; pleasant , NAD, elderly  HEENT:  [place]/AT,  EACs-clear, TMs-wnl, NOSE-clear, THROAT-clear, no lesions, no postnasal drip or exudate noted.   NECK:  Supple w/ fair ROM; no JVD; normal carotid impulses w/o bruits; no thyromegaly or nodules palpated; no lymphadenopathy.  RESP  Coarse BS  w/o, wheezes/ rales/ or rhonchi.no accessory muscle use, no dullness to percussion  CARD:  RRR, no m/r/g  , no peripheral edema, pulses intact, no cyanosis or clubbing.  GI:   Soft &  nt; nml bowel sounds; no organomegaly or masses detected.  Musco: Warm bil, no deformities or joint swelling noted.   Neuro: alert, no focal deficits noted.    Skin: Warm, no lesions or rashes      Assessment & Plan:

## 2011-07-26 NOTE — Patient Instructions (Addendum)
Restart Nexium 40mg   daily before meal GERD diet  Stop Fish oil and fflax seed oil.  Stay on Prednisone 15mg  daily  follow up Dr. Vassie Loll  In 4 weeks and As needed   Please contact office for sooner follow up if symptoms do not improve or worsen or seek emergency care  I will call with xray and labs.  Flu shot today

## 2011-07-26 NOTE — Progress Notes (Signed)
Addended by: Fenton Foy on: 07/26/2011 11:31 AM   Modules accepted: Orders

## 2011-07-26 NOTE — Assessment & Plan Note (Signed)
Flare with new areas on CT on 07/11/11 when tapered off steroids  Improvement in cough with restarting of steroids.  Will try to eliminate possible triggers - ie Reflux, oil based vitamins  Plan;  Restart Nexium 40mg   daily before meal GERD diet  Stop Fish oil and fflax seed oil.  Stay on Prednisone 15mg  daily  follow up Dr. Vassie Loll  In 4 weeks and As needed   Please contact office for sooner follow up if symptoms do not improve or worsen or seek emergency care  I will call with xray and labs.  Flu shot today

## 2011-08-23 ENCOUNTER — Encounter: Payer: Self-pay | Admitting: Adult Health

## 2011-08-23 ENCOUNTER — Ambulatory Visit (INDEPENDENT_AMBULATORY_CARE_PROVIDER_SITE_OTHER): Payer: Medicare Other | Admitting: Adult Health

## 2011-08-23 DIAGNOSIS — J189 Pneumonia, unspecified organism: Secondary | ICD-10-CM

## 2011-08-23 DIAGNOSIS — J45909 Unspecified asthma, uncomplicated: Secondary | ICD-10-CM

## 2011-08-23 MED ORDER — ALBUTEROL SULFATE (2.5 MG/3ML) 0.083% IN NEBU
2.5000 mg | INHALATION_SOLUTION | Freq: Once | RESPIRATORY_TRACT | Status: AC
  Administered 2011-08-23: 2.5 mg via RESPIRATORY_TRACT

## 2011-08-23 NOTE — Patient Instructions (Signed)
May change Nexium to Prilosec 20mg   daily before meal GERD diet   Decrease  Prednisone 10mg  daily  follow up Dr. Vassie Loll  In 4  weeks and As needed  With chest xray  Please contact office for sooner follow up if symptoms do not improve or worsen or seek emergency care

## 2011-08-29 NOTE — Progress Notes (Signed)
Subjective:    Patient ID: Joan Weaver, female    DOB: 01/27/1941, 70 y.o.   MRN: [id number]  HPI  Oncologist : Donnie Coffin, Dr Mitzi Hansen  [name], never smoker for FU of radiation pneumonitis.   Diagnosed with breast CA in July '11, underwent lumpectomy ([facility]) followed by RT , last nov'11.  She presented with pneumonia since Feb '12 not responding to antibiotics. 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  Tapered down to 5 mg on June 1st as planned - started feeling bad followed by sore throat, sinus drainage & increased cough , back up to 10 mg last 3 days without relief.  CXR improved LUL aeration, persistent LLL air space disease.>> pred increased to 20 mg , then tapered back down to off in sep'12. Has been off pred x 8ds now. Pt c/o nasal congestion, PND, productive cough with yellow phlegm, chest tigntness, and wheezing x 3 days. CBC, BMET nml >>restarted on Pred 5mg    07/26/2011 Follow up  No new complaints. c/o still being tired, cough is less with white mucus, has reduced Prednisone to 15mg  . CT chest on 07/11/11 showed Multiple shifting areas of consolidation in both lungs. Areas  of consolidation seen on the prior chest CT have resolved and there are new, similar appearing areas of predominately peripheral consolidation. She was instructed to increase prednisone 20mg  daily  , she did this for a while then 3 days ago she decreased to 15mg  (currently).  Cough is much better.  >>pred held at 15mg  daily , added nexium for reflux sx.   08/23/11 Follow up  4 week follow up PNA . Reports still having tiredness/weakness and tremors, but reports it has improved since  last ov. She is overall feeling better just still weak with low energy.  She is on prednisone 15mg  daily . She feels better on nexium but wants somethiing more cost effective.  Denies increased edema,  vomittting, chest pain or discolored mucus.    Review of Systems  Constitutional:   No  weight loss, night sweats,  Fevers, chills,  +fatigue, or  lassitude.  HEENT:   No headaches,  Difficulty swallowing,  Tooth/dental problems, or  Sore throat,                No sneezing, itching, ear ache, nasal congestion, post nasal drip,   CV:  No chest pain,  Orthopnea, PND, swelling in lower extremities, anasarca, dizziness, palpitations, syncope.   GI  No heartburn, indigestion, abdominal pain, nausea, vomiting, diarrhea, change in bowel habits, loss of appetite, bloody stools.   Resp:  No coughing up of blood.  No change in color of mucus.  No wheezing.  No chest wall deformity  Skin: no rash or lesions.  GU: no dysuria, change in color of urine, no urgency or frequency.  No flank pain, no hematuria   MS:  No joint pain or swelling.  No decreased range of motion.     Psych:  No change in mood or affect. No depression or anxiety.  No memory loss.         Objective:   Physical Exam GEN: A/Ox3; pleasant , NAD, elderly  HEENT:  [place]/AT,  EACs-clear, TMs-wnl, NOSE-clear, THROAT-clear, no lesions, no postnasal drip or exudate noted.   NECK:  Supple w/ fair ROM; no JVD; normal carotid impulses w/o bruits; no thyromegaly or nodules palpated; no lymphadenopathy.  RESP  Coarse BS  w/o, wheezes/ rales/ or rhonchi.no accessory muscle use, no dullness to percussion  CARD:  RRR, no m/r/g  , no peripheral edema, pulses intact, no cyanosis or clubbing.  GI:   Soft & nt; nml bowel sounds; no organomegaly or masses detected.  Musco: Warm bil, no deformities or joint swelling noted.   Neuro: alert, no focal deficits noted.    Skin: Warm, no lesions or rashes      Assessment & Plan:

## 2011-08-29 NOTE — Assessment & Plan Note (Signed)
Radiation pnuemonitis - clinically improving slowly Will further decrease steroids, have her return in 4 weeks with follow up cxr   Plan;  May change Nexium to Prilosec 20mg   daily before meal GERD diet   Decrease  Prednisone 10mg  daily  follow up Dr. Vassie Loll  In 4  weeks and As needed  With chest xray  Please contact office for sooner follow up if symptoms do not improve or worsen or seek emergency care

## 2011-09-27 ENCOUNTER — Other Ambulatory Visit: Payer: Self-pay | Admitting: Pulmonary Disease

## 2011-09-27 DIAGNOSIS — J189 Pneumonia, unspecified organism: Secondary | ICD-10-CM

## 2011-09-28 ENCOUNTER — Other Ambulatory Visit: Payer: Self-pay | Admitting: Oncology

## 2011-09-28 ENCOUNTER — Ambulatory Visit (INDEPENDENT_AMBULATORY_CARE_PROVIDER_SITE_OTHER)
Admission: RE | Admit: 2011-09-28 | Discharge: 2011-09-28 | Disposition: A | Discharge status: Home / Self Care | Payer: Medicare Other | Source: Ambulatory Visit | Attending: Pulmonary Disease | Admitting: Pulmonary Disease

## 2011-09-28 ENCOUNTER — Other Ambulatory Visit (HOSPITAL_BASED_OUTPATIENT_CLINIC_OR_DEPARTMENT_OTHER): Payer: Medicare Other | Admitting: Lab

## 2011-09-28 ENCOUNTER — Ambulatory Visit (INDEPENDENT_AMBULATORY_CARE_PROVIDER_SITE_OTHER): Payer: Medicare Other | Admitting: Pulmonary Disease

## 2011-09-28 ENCOUNTER — Encounter: Payer: Self-pay | Admitting: Pulmonary Disease

## 2011-09-28 VITALS — BP 116/60 | HR 84 | Temp 98.9°F | Ht 62.0 in | Wt 188.0 lb

## 2011-09-28 DIAGNOSIS — M810 Age-related osteoporosis without current pathological fracture: Secondary | ICD-10-CM

## 2011-09-28 DIAGNOSIS — I10 Essential (primary) hypertension: Secondary | ICD-10-CM

## 2011-09-28 DIAGNOSIS — K219 Gastro-esophageal reflux disease without esophagitis: Secondary | ICD-10-CM

## 2011-09-28 DIAGNOSIS — J189 Pneumonia, unspecified organism: Secondary | ICD-10-CM

## 2011-09-28 DIAGNOSIS — C50419 Malignant neoplasm of upper-outer quadrant of unspecified female breast: Secondary | ICD-10-CM

## 2011-09-28 LAB — CBC WITH DIFFERENTIAL/PLATELET
BASO%: 0.5 % (ref 0.0–2.0)
EOS%: 0.3 % (ref 0.0–7.0)
MCH: 29.4 pg (ref 25.1–34.0)
MCHC: 33.3 g/dL (ref 31.5–36.0)
MCV: 88.4 fL (ref 79.5–101.0)
MONO%: 2.9 % (ref 0.0–14.0)
RBC: 4.76 10*6/uL (ref 3.70–5.45)
RDW: 15.6 % — ABNORMAL HIGH (ref 11.2–14.5)
lymph#: 0.8 10*3/uL — ABNORMAL LOW (ref 0.9–3.3)

## 2011-09-28 LAB — COMPREHENSIVE METABOLIC PANEL
ALT: 15 U/L (ref 0–35)
AST: 20 U/L (ref 0–37)
Albumin: 3.6 g/dL (ref 3.5–5.2)
Alkaline Phosphatase: 41 U/L (ref 39–117)
BUN: 20 mg/dL (ref 6–23)
Calcium: 9.9 mg/dL (ref 8.4–10.5)
Chloride: 100 mEq/L (ref 96–112)
Creatinine, Ser: 1.06 mg/dL (ref 0.50–1.10)
Potassium: 4.4 mEq/L (ref 3.5–5.3)

## 2011-09-28 MED ORDER — ESOMEPRAZOLE MAGNESIUM 40 MG PO CPDR: 40.0000 mg | DELAYED_RELEASE_CAPSULE | Freq: Every day | ORAL | Status: DC | PRN

## 2011-09-28 NOTE — Assessment & Plan Note (Signed)
Drop to 5 mg daily x 1 month , then taper to off DD includes GERD related aspiration >> would advise GI consult given h/o hiatal hernia surgery in the past - note AF level on CT Stay on nexium daily

## 2011-09-28 NOTE — Progress Notes (Signed)
  Subjective:    Patient ID: Joan Weaver, female    DOB: [birth year], 70 y.o.   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  Diagnosed with breast CA in July '11, underwent lumpectomy ([facility]) followed by RT , last nov'11.  She presented with pneumonia since Feb '12 not responding to antibiotics.  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  10/12 >>restarted on Pred 5mg   CT chest on 07/11/11 showed Multiple shifting areas of consolidation in both lungs. Areas  of consolidation seen on the prior chest CT have resolved and there are new, similar appearing areas of predominately peripheral consolidation. Dilated esophagus with Af level >> increased prednisone 20mg  daily , added nexium for reflux sx.    09/28/2011  feels tight, reflux - had surgery many yrs ago, EKG ok, choking - food gets stuck Takes zantac daily, nexium as needed  She is overall feeling better just still weak with low energy.  She is on prednisone 10mg  daily . CXR >> improved RUL, Lt paracardiac persists Denies increased edema, vomittting, chest pain or discolored mucus   Review of Systems Patient denies significant dyspnea,cough, hemoptysis,  chest pain, palpitations, pedal edema, orthopnea, paroxysmal nocturnal dyspnea, lightheadedness, nausea, vomiting, abdominal or  leg pains      Objective:   Physical Exam  Gen. Pleasant, well-nourished, in no distress ENT - no lesions, no post nasal drip Neck: No JVD, no thyromegaly, no carotid bruits Lungs: no use of accessory  muscles, no dullness to percussion, clear without rales or rhonchi  Cardiovascular: Rhythm regular, heart sounds  normal, no murmurs or gallops, no peripheral edema Musculoskeletal: No deformities, no cyanosis or clubbing         Assessment & Plan:

## 2011-09-28 NOTE — Patient Instructions (Signed)
Xray today WiIl call about decrease in prednisone to 5mg  if OK Nexium Rx - take daily -  Take Zantac for breakthrough

## 2011-10-05 ENCOUNTER — Telehealth: Payer: Self-pay | Admitting: *Deleted

## 2011-10-05 ENCOUNTER — Ambulatory Visit (HOSPITAL_BASED_OUTPATIENT_CLINIC_OR_DEPARTMENT_OTHER): Payer: Medicare Other | Admitting: Oncology

## 2011-10-05 VITALS — BP 135/85 | HR 88 | Temp 98.5°F | Ht 62.0 in | Wt 189.4 lb

## 2011-10-05 DIAGNOSIS — C50919 Malignant neoplasm of unspecified site of unspecified female breast: Secondary | ICD-10-CM

## 2011-10-05 NOTE — Telephone Encounter (Signed)
gave patient appointment for six months and lab a week before

## 2011-11-02 ENCOUNTER — Encounter (INDEPENDENT_AMBULATORY_CARE_PROVIDER_SITE_OTHER): Payer: Self-pay | Admitting: General Surgery

## 2011-11-09 ENCOUNTER — Encounter: Payer: Self-pay | Admitting: Pulmonary Disease

## 2011-11-09 ENCOUNTER — Ambulatory Visit (INDEPENDENT_AMBULATORY_CARE_PROVIDER_SITE_OTHER): Payer: Medicare Other | Admitting: Pulmonary Disease

## 2011-11-09 VITALS — BP 112/84 | HR 90 | Temp 98.1°F | Ht 62.0 in | Wt 190.6 lb

## 2011-11-09 DIAGNOSIS — Z23 Encounter for immunization: Secondary | ICD-10-CM

## 2011-11-09 DIAGNOSIS — J189 Pneumonia, unspecified organism: Secondary | ICD-10-CM

## 2011-11-09 IMAGING — CR DG CHEST 2V
2 series · 2 of 2 positions shown · non-contrast
Comparison: Plain films of the chest [DATE], [DATE], [DATE] and
01/06/2011.  CT chest 12/27/2010.

CLINICAL DATA: History of pneumonia.  Recent pneumothorax.

CHEST - 2 VIEW

[view not recorded (1 of 2)]
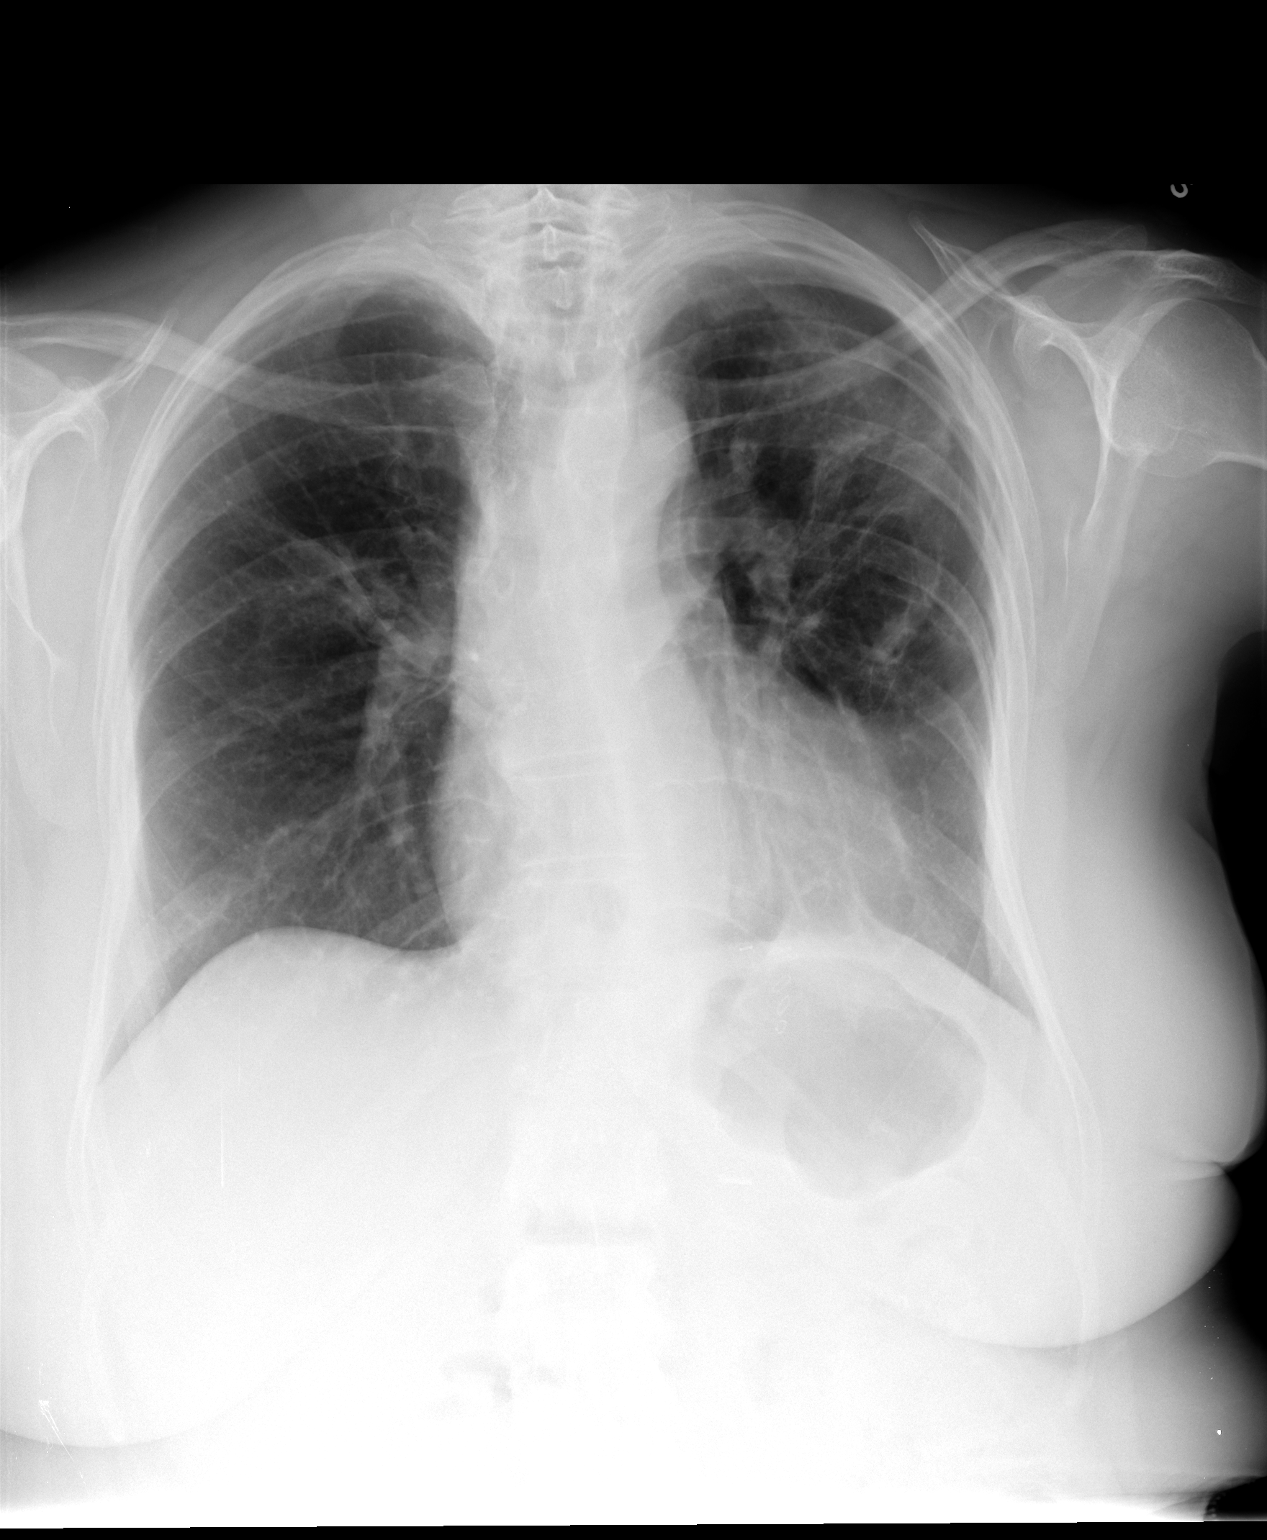

[view not recorded (2 of 2)]
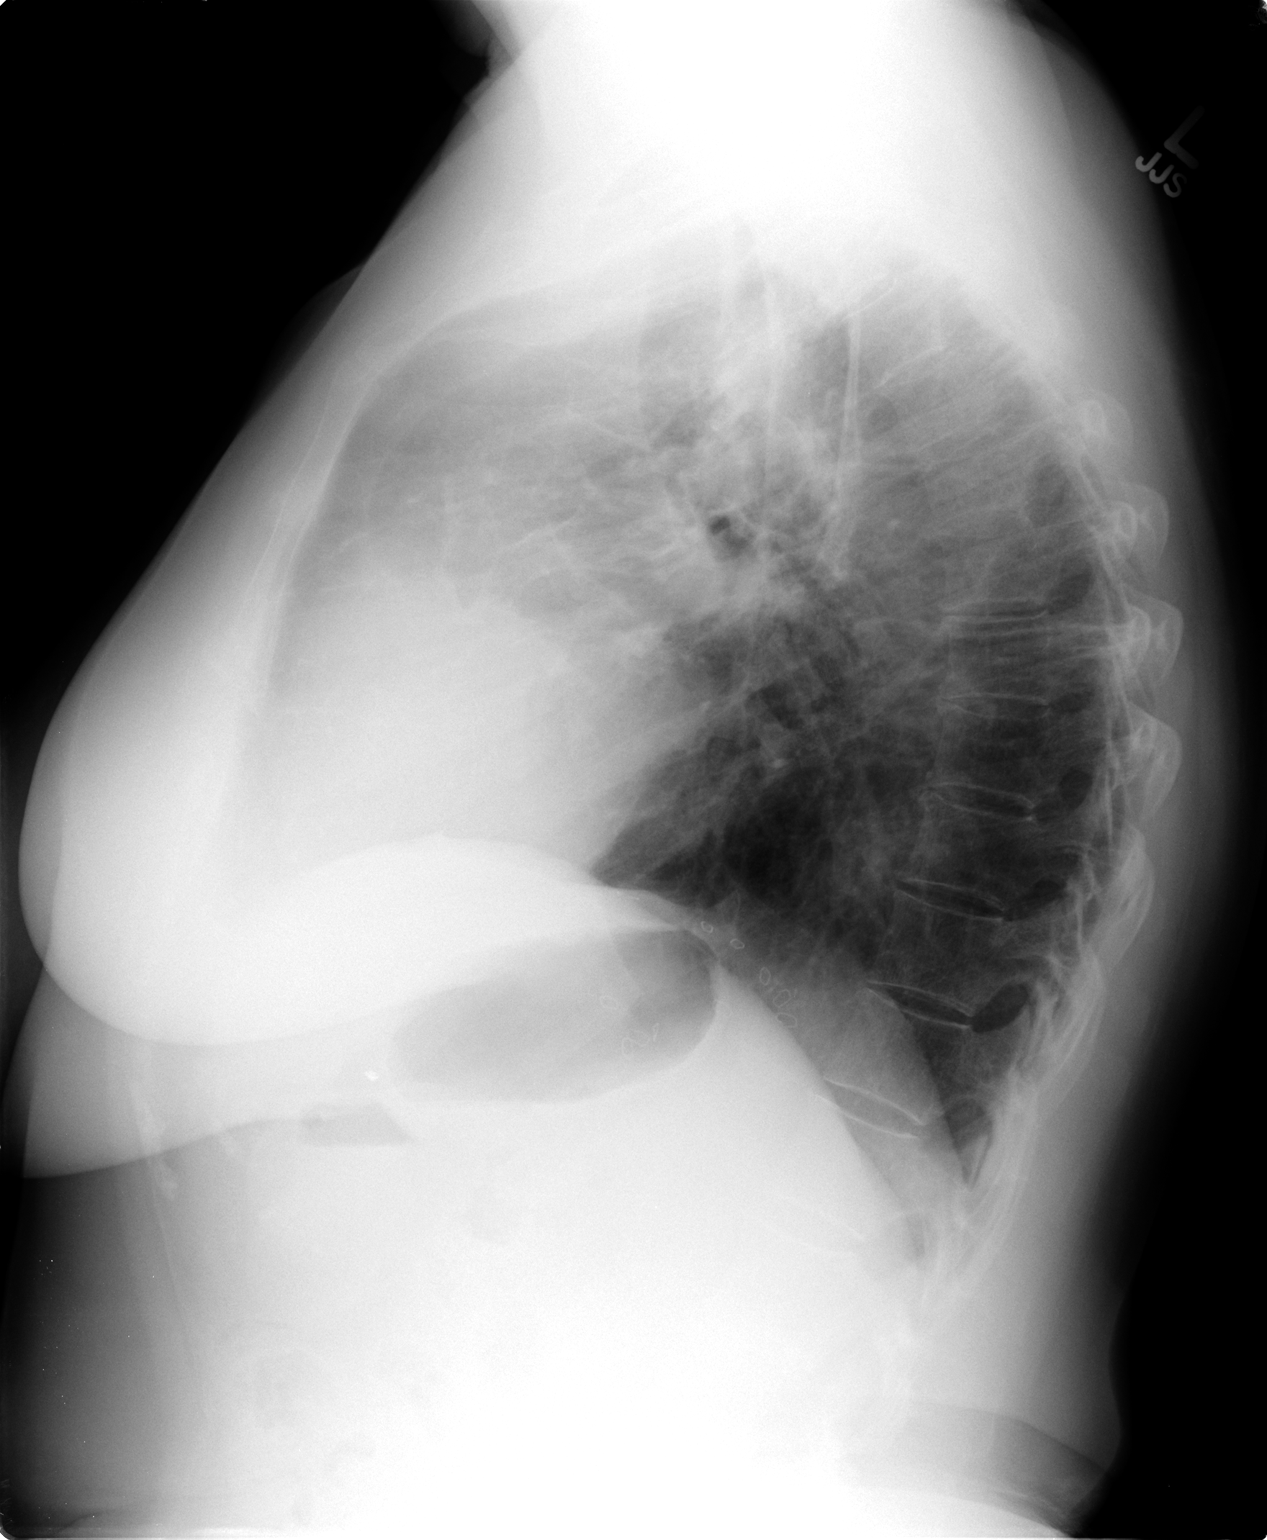

[2 of 2 positions shown; findings below may reference images not displayed]

FINDINGS: Bilateral airspace disease, worse on the left upper lobe,
appears improved since the most recent examination.  No
pneumothorax is identified.  No pleural effusion.  Heart size upper
normal.
IMPRESSION: Continued improvement in aeration.  No pneumothorax or other new
abnormality.

## 2011-11-09 NOTE — Progress Notes (Signed)
  Subjective:    Patient ID: Joan Weaver, female    DOB: 05-04-1941, 71 y.o.   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  Diagnosed with breast CA in July '11, underwent lumpectomy ([facility]) followed by RT , last nov'11.  She presented with pneumonia since Feb '12 not responding to antibiotics.  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  10/12 >>restarted on Pred 5mg   CT chest on 07/11/11 showed Multiple shifting areas of consolidation in both lungs. Areas  of consolidation seen on the prior chest CT have resolved and there are new, similar appearing areas of predominately peripheral consolidation. Dilated esophagus with Af level  >> increased prednisone 20mg  daily , added nexium for reflux sx.    11/09/2011 Dropped to 5mg , worse increased back up to 10 mg , now on 10/15 alternating Takes zantac daily, nexium as needed ,nexium causes diarrhea She is overall feeling better just still weak with low energy.  She is on prednisone 10mg  daily .Last  CXR >> improved RUL, Lt paracardiac persists  DD includes GERD related aspiration >> advised GI consult given h/o hiatal hernia surgery in 2005  [place] trip planned to [place] Denies increased edema, vomittting, chest pain or discolored mucus    Review of Systems Patient denies significant dyspnea,cough, hemoptysis,  chest pain, palpitations, pedal edema, orthopnea, paroxysmal nocturnal dyspnea, lightheadedness, nausea, vomiting, abdominal or  leg pains      Objective:   Physical Exam  Gen.  Pleasant, well-nourished, in no distress ENT - no lesions, no post nasal drip Neck: No JVD, no thyromegaly, no carotid bruits Lungs: no use of accessory muscles, no dullness to percussion, clear without rales or rhonchi  Cardiovascular: Rhythm regular, heart sounds  normal, no murmurs or gallops, no peripheral edema Musculoskeletal: No deformities, no cyanosis or clubbing        Assessment & Plan:

## 2011-11-09 NOTE — Patient Instructions (Signed)
Stay on zantac twice daily Take nexium if reflux is worse Once back from [place] trip, drop prednisone back down to 5 mg daily x 2 weeks, then 5 mg m/w/f x 2 weeks then off Call back if worse - may need barium test & GI consultation

## 2011-11-11 NOTE — Assessment & Plan Note (Signed)
Stay on zantac twice daily Take nexium if reflux is worse Once back from [place] trip, drop prednisone back down to 5 mg daily x 2 weeks, then 5 mg m/w/f x 2 weeks then off Call back if worse - may need barium test & GI consultation 

## 2012-01-08 ENCOUNTER — Telehealth: Payer: Self-pay | Admitting: Pulmonary Disease

## 2012-01-08 NOTE — Telephone Encounter (Signed)
LOVx3 Pulmonary faxed to [facility]  @ [phone] 01/08/12/KM

## 2012-01-15 ENCOUNTER — Encounter: Payer: Self-pay | Admitting: Pulmonary Disease

## 2012-01-15 ENCOUNTER — Ambulatory Visit (INDEPENDENT_AMBULATORY_CARE_PROVIDER_SITE_OTHER): Payer: Medicare Other | Admitting: Pulmonary Disease

## 2012-01-15 ENCOUNTER — Ambulatory Visit (INDEPENDENT_AMBULATORY_CARE_PROVIDER_SITE_OTHER)
Admission: RE | Admit: 2012-01-15 | Discharge: 2012-01-15 | Disposition: A | Discharge status: Home / Self Care | Payer: Medicare Other | Source: Ambulatory Visit | Attending: Pulmonary Disease | Admitting: Pulmonary Disease

## 2012-01-15 VITALS — BP 122/76 | HR 100 | Temp 98.7°F | Ht 62.0 in | Wt 190.2 lb

## 2012-01-15 DIAGNOSIS — J189 Pneumonia, unspecified organism: Secondary | ICD-10-CM

## 2012-01-15 NOTE — Patient Instructions (Signed)
Ambulatory satn' chest xray today Stay on zantac twice daily Limb elevation for swelling in legs [place] if shortness of breath is worse

## 2012-01-15 NOTE — Progress Notes (Signed)
  Subjective:    Patient ID: Joan Weaver, female    DOB: 1941/06/28, 71 y.o.   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  Diagnosed with breast CA in July '11, underwent lumpectomy ([facility]) followed by RT , last nov'11.  She presented with pneumonia since Feb '12 not responding to antibiotics.  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  10/12 >>restarted on Pred 5mg   CT chest on 07/11/11 showed Multiple shifting areas of consolidation in both lungs. Areas  of consolidation seen on the prior chest CT have resolved and there are new, similar appearing areas of predominately peripheral consolidation. Dilated esophagus with Af level  >> increased prednisone 20mg  daily , added nexium for reflux sx - causes diarrhea Prednisone tapered off 2/13   DD includes GERD related aspiration >> advised GI consult given h/o hiatal hernia surgery in 2005   01/15/2012 Off prednisone x 5 weeks Cataract sx last week, On ride back from [place] trip  to [place] - developed sharp RHC pain - rib fractures - not seen on cxr Pt states she still gets SOB w/ very little exertion. C/o very little dry cough, wheezing from time to time Denies increased edema, vomittting, chest pain or discolored mucus CXR showed enlarging nodular opacity in the lateral right upper lobe compared to dec'12.   Review of Systems Patient denies significant dyspnea,cough, hemoptysis,  chest pain, palpitations, pedal edema, orthopnea, paroxysmal nocturnal dyspnea, lightheadedness, nausea,  vomiting, abdominal or  leg pains      Objective:   Physical Exam  Gen. Pleasant, well-nourished, in no distress ENT - no lesions, no post nasal drip Neck: No JVD, no thyromegaly, no carotid bruits Lungs: no use of accessory muscles, no dullness to percussion, clear without rales or rhonchi  Cardiovascular: Rhythm regular, heart sounds  normal, no murmurs or gallops, no peripheral edema Musculoskeletal: No deformities, no cyanosis or clubbing        Assessment & Plan:

## 2012-01-16 ENCOUNTER — Other Ambulatory Visit (INDEPENDENT_AMBULATORY_CARE_PROVIDER_SITE_OTHER): Payer: Medicare Other

## 2012-01-16 DIAGNOSIS — J189 Pneumonia, unspecified organism: Secondary | ICD-10-CM

## 2012-01-16 LAB — BASIC METABOLIC PANEL
BUN: 19 mg/dL (ref 6–23)
CO2: 29 mEq/L (ref 19–32)
GFR: 56.08 mL/min — ABNORMAL LOW (ref >60.00)
Glucose, Bld: 101 mg/dL — ABNORMAL HIGH (ref 70–99)
Potassium: 4.4 mEq/L (ref 3.5–5.1)
Sodium: 140 mEq/L (ref 135–145)

## 2012-01-16 NOTE — Assessment & Plan Note (Signed)
DD incl aspiration, BOOP &   radiation pneumonitis CT chest 07/07/11 Multiple shifting areas of consolidation in both lungs. Areas of consolidation seen on the prior chest CT have resolved and there are new, similar appearing areas of predominately peripheral consolidation .   Given enlarging area on CXR, will rpt CT scan & compare However given side effects of prednisone, will keep her off this for some time

## 2012-01-18 ENCOUNTER — Ambulatory Visit (INDEPENDENT_AMBULATORY_CARE_PROVIDER_SITE_OTHER)
Admission: RE | Admit: 2012-01-18 | Discharge: 2012-01-18 | Disposition: A | Discharge status: Home / Self Care | Payer: Medicare Other | Source: Ambulatory Visit | Attending: Pulmonary Disease | Admitting: Pulmonary Disease

## 2012-01-18 DIAGNOSIS — J189 Pneumonia, unspecified organism: Secondary | ICD-10-CM

## 2012-01-18 MED ORDER — IOHEXOL 300 MG/ML  SOLN
80.0000 mL | Freq: Once | INTRAMUSCULAR | Status: AC | PRN
  Administered 2012-01-18: 80 mL via INTRAVENOUS

## 2012-01-19 ENCOUNTER — Telehealth: Payer: Self-pay | Admitting: Pulmonary Disease

## 2012-01-19 DIAGNOSIS — J189 Pneumonia, unspecified organism: Secondary | ICD-10-CM

## 2012-01-19 NOTE — Telephone Encounter (Signed)
Discussed CT results with pt - new area of patchy consolidation - DD incl GERD/aspiration vs BOOP with recurrence off steroids Pl make appt in 3 mnths with CXR

## 2012-01-19 NOTE — Telephone Encounter (Signed)
I spoke with pt and she is coming in 04/05/12. Pt aware to have cxr done prior. Nothing further was needed

## 2012-01-29 ENCOUNTER — Telehealth: Payer: Self-pay | Admitting: Pulmonary Disease

## 2012-01-29 MED ORDER — AZITHROMYCIN 250 MG PO TABS: ORAL_TABLET | ORAL | Status: AC

## 2012-01-29 NOTE — Telephone Encounter (Signed)
rx sent. Pt is aware. Pattricia Weiher, CMA  

## 2012-01-29 NOTE — Telephone Encounter (Signed)
z-pak & mucinex please

## 2012-01-29 NOTE — Telephone Encounter (Signed)
Called and spoke with pt.  Pt states she was recently seen by RA on 01/15/12.  Pt believes she now has a sinus infection.  C/o sinus pressure, sore throat, PND, productive cough with yellow sputum, "rattling in chest" and wheezing.  Denies fever.  Pt is scheduled for cataract surgery on Monday and is worried about possible sinus infection interfering with surgery.(pt is taking Delsym) Pt is requesting RA's recs.  Please advise.  Thanks! Allergies  Allergen Reactions  . Advair Diskus (Fluticasone-Salmeterol)     Nervous and shakey  . Aspirin     If she takes too much, it burns her stomach  . Demerol Nausea Only

## 2012-02-29 ENCOUNTER — Ambulatory Visit (INDEPENDENT_AMBULATORY_CARE_PROVIDER_SITE_OTHER): Payer: Medicare Other | Admitting: Adult Health

## 2012-02-29 ENCOUNTER — Encounter: Payer: Self-pay | Admitting: Adult Health

## 2012-02-29 ENCOUNTER — Other Ambulatory Visit (INDEPENDENT_AMBULATORY_CARE_PROVIDER_SITE_OTHER): Payer: Medicare Other

## 2012-02-29 ENCOUNTER — Ambulatory Visit (INDEPENDENT_AMBULATORY_CARE_PROVIDER_SITE_OTHER)
Admission: RE | Admit: 2012-02-29 | Discharge: 2012-02-29 | Disposition: A | Discharge status: Home / Self Care | Payer: Medicare Other | Source: Ambulatory Visit | Attending: Adult Health | Admitting: Adult Health

## 2012-02-29 VITALS — BP 130/72 | HR 114 | Temp 97.2°F | Ht 62.0 in | Wt 186.6 lb

## 2012-02-29 DIAGNOSIS — J189 Pneumonia, unspecified organism: Secondary | ICD-10-CM

## 2012-02-29 DIAGNOSIS — R0602 Shortness of breath: Secondary | ICD-10-CM

## 2012-02-29 LAB — CBC WITH DIFFERENTIAL/PLATELET
Basophils Relative: 0.6 % (ref 0.0–3.0)
Eosinophils Absolute: 0.1 10*3/uL (ref 0.0–0.7)
Eosinophils Relative: 1.4 % (ref 0.0–5.0)
HCT: 40.8 % (ref 36.0–46.0)
Lymphs Abs: 1.7 10*3/uL (ref 0.7–4.0)
MCHC: 31.9 g/dL (ref 30.0–36.0)
MCV: 88.4 fl (ref 78.0–100.0)
Monocytes Absolute: 0.8 10*3/uL (ref 0.1–1.0)
Neutro Abs: 5 10*3/uL (ref 1.4–7.7)
Neutrophils Relative %: 65.5 % (ref 43.0–77.0)
RBC: 4.62 Mil/uL (ref 3.87–5.11)
WBC: 7.7 10*3/uL (ref 4.5–10.5)

## 2012-02-29 MED ORDER — LEVOFLOXACIN 500 MG PO TABS: 500.0000 mg | ORAL_TABLET | Freq: Every day | ORAL | Status: AC

## 2012-02-29 MED ORDER — PREDNISONE 20 MG PO TABS: ORAL_TABLET | ORAL | Status: DC

## 2012-02-29 NOTE — Patient Instructions (Addendum)
Begin Prednisone 20mg  2 tabs daily for 2 weeks then 1 tab daily and hold at this dose  Levaquin 500mg  daily for 7 days  Mucinex DM Twice daily  As needed  Cough/congestion  Fluids and rest  Please contact office for sooner follow up if symptoms do not improve or worsen or seek emergency care  Follow up Dr. Vassie Loll  In 2-3 weeks with chest xray .    Late add :  Refer to GI for evaluation of possible aspiration

## 2012-02-29 NOTE — Progress Notes (Signed)
Subjective:    Patient ID: Joan Weaver, female    DOB: 03-07-1941, 71 y.o.   MRN: [id number]  HPI  Oncologist : Donnie Coffin, Dr Mitzi Hansen   70/F, never smoker for FU of atypical pneumonitis.  Diagnosed with breast CA in July '11, underwent lumpectomy ([facility]) followed by RT , last nov'11.  She presented with pneumonia since Feb '12 not responding to antibiotics.  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  10/12 >>restarted on Pred 5mg   CT chest on 07/11/11 showed Multiple shifting areas of consolidation in both lungs. Areas  of consolidation seen on the prior chest CT have resolved and there are new, similar appearing areas of predominately peripheral consolidation. Dilated esophagus with Af level  >> increased prednisone 20mg  daily , added nexium for reflux sx - causes diarrhea Prednisone tapered off 2/13  >pred 40mg  restarted 02/29/2012 , CXR RUL consolidation enlarged /symptomatic   DD includes GERD related aspiration >> advised GI consult given h/o hiatal hernia surgery in 2005   01/15/12  Off prednisone x 5 weeks Cataract sx last week, On ride [place] from [place] trip  to [place] - developed sharp RHC pain - rib fractures - not seen on cxr Pt states she still gets SOB w/ very little exertion. C/o very little dry cough, wheezing from time to time Denies increased edema, vomittting, chest pain or discolored mucus CXR showed enlarging nodular opacity in the lateral right upper lobe compared to dec'12. >>CT chest showed   02/29/2012 Acute OV  Complains of increased SOB, wheezing, prod cough with white/yellow  mucus, sweats, shakiness x2 months, worse x2 weeks. Since last ov feels she has slowly been going down hill.  Increased DOE. Very weak w/ no energy.  CXR today shows RUL consolidation .  Last 2 week , increased cough and congestion w/ sweats  No documented fever.  No dysphagia.  Has a hx of HH surgery in past .  No hemoptysis, edema, chest pain or overt reflux.    Review of Systems  Constitutional:   No  weight loss, night sweats,  Fevers,  +chills, fatigue, or  lassitude.  HEENT:   No headaches,  Difficulty swallowing,  Tooth/dental problems, or  Sore throat,                No sneezing, itching, ear ache, nasal congestion, post nasal drip,   CV:  No chest pain,  Orthopnea, PND, swelling in lower extremities, anasarca, dizziness, palpitations, syncope.   GI  No heartburn, indigestion, abdominal pain, nausea, vomiting, diarrhea, change in bowel habits, loss of appetite, bloody stools.   Resp:    No coughing up of blood.     No chest wall deformity  Skin: no rash or lesions.  GU: no dysuria, change in color of urine, no urgency or frequency.  No flank pain, no hematuria   MS:  No joint pain or swelling.  No decreased range of motion.  No back pain.  Psych:  No change in mood or affect. No depression or anxiety.  No memory loss.  Objective:   Physical Exam   Gen. Pleasant, well-nourished, in no distress ENT - no lesions, no post nasal drip Neck: No JVD, no thyromegaly, no carotid bruits Lungs: no use of accessory muscles, no dullness to percussion, clear without rales or rhonchi  Cardiovascular: Rhythm regular, heart sounds  normal, no murmurs or gallops, no peripheral edema Musculoskeletal: No deformities, no cyanosis or clubbing   CXR - increased RUL consolidation      Assessment & Plan:

## 2012-02-29 NOTE — Assessment & Plan Note (Signed)
Atypical Pneumonia w/ prev. FOB bx + for radiation Pneumonitis . Neg for BOOP  Weaned off steroids for 10 weeks  CXR today shows enlarging RUL consolidation (CT chest last month >Migratory areas of airspace consolidation bilaterally, with overall improvement from prior exams. New air space consolidation in the right upper lobe.with new rul consolidation )   Plan:  Will restart steroids , pred 40 x  2week then 20mg  daily hold this dose  Check ESR /cbc  levaquin x 7 days  Return in 2-3 weeks with alva with CXR

## 2012-03-01 ENCOUNTER — Other Ambulatory Visit: Payer: Self-pay | Admitting: Adult Health

## 2012-03-01 ENCOUNTER — Telehealth: Payer: Self-pay | Admitting: Gastroenterology

## 2012-03-01 DIAGNOSIS — J189 Pneumonia, unspecified organism: Secondary | ICD-10-CM

## 2012-03-01 NOTE — Progress Notes (Signed)
Orders only encounter created for GI referral for possible aspiration per 5.23.13 ov w/ TP.  Pt is aware.

## 2012-03-01 NOTE — Telephone Encounter (Signed)
lmom for Joan Weaver in pulmonary to call back; pt has an appt on 03/06/12 with Willette Cluster, NP.

## 2012-03-06 ENCOUNTER — Encounter: Payer: Self-pay | Admitting: Nurse Practitioner

## 2012-03-06 ENCOUNTER — Ambulatory Visit (INDEPENDENT_AMBULATORY_CARE_PROVIDER_SITE_OTHER): Payer: Medicare Other | Admitting: Nurse Practitioner

## 2012-03-06 ENCOUNTER — Encounter: Payer: Self-pay | Admitting: Internal Medicine

## 2012-03-06 VITALS — BP 142/84 | HR 88 | Ht 62.0 in | Wt 188.2 lb

## 2012-03-06 DIAGNOSIS — R1310 Dysphagia, unspecified: Secondary | ICD-10-CM

## 2012-03-06 DIAGNOSIS — J189 Pneumonia, unspecified organism: Secondary | ICD-10-CM

## 2012-03-06 DIAGNOSIS — Z8719 Personal history of other diseases of the digestive system: Secondary | ICD-10-CM

## 2012-03-06 DIAGNOSIS — K219 Gastro-esophageal reflux disease without esophagitis: Secondary | ICD-10-CM

## 2012-03-06 NOTE — Progress Notes (Addendum)
03/06/2012 Joan Weaver [id number] Aug 27, 1941   HISTORY OF PRESENT ILLNESS: This Joan Weaver is a 71 year old female, new to this practice, referred by pulmonary for evaluation of possible aspiration. Patient is accompanied by a friend.  Patient has a history of breast cancer diagnosed 2011, she is s/p breast resection and XRT.  Patient has a history of radiation pneumonitis (March 2013) and is followed by pulmonary. Last week a CXR showed a new enlarging right upper lobe consolidation. Patient was given prednisone and Levaquin as well as a referral to [place] for evaluation of possible aspiration.   In 2005 patient had what sounds like a hiatal hernial repair in [place]. [place]. Several months later the surgery "failed" and she went back for second procedure involving a mesh.  She takes Zantac twice daily but still has heartburn 2-3 times a week. She tried Nexium but it gave her diarrhea. She admits to intermittent dysphagia to breads and rice. Liquids go down fine.  Past Medical History  Diagnosis Date  . Breast cancer August 2011  . Thyroid disease   . Hypertension   . Osteoporosis   . Arthritis   . Hernia    Past Surgical History  Procedure Date  . Hiatal hernia repair     x 2  . Tonsillectomy   . Appendectomy   . Left cataract surgery   . Breast lumpectomy     left    reports that she has never smoked. She has never used smokeless tobacco. She reports that she does not drink alcohol or use illicit drugs. family history includes Heart disease in her maternal grandmother; Prostate cancer in her maternal grandfather and paternal grandfather; and Rheum arthritis in her mother. Allergies  Allergen Reactions  . Advair Diskus (Fluticasone-Salmeterol)     Nervous and shakey  . Aspirin     If she takes too much, it burns her stomach  . Demerol Nausea Only      Outpatient Encounter Prescriptions as of 03/06/2012  Medication Sig Dispense Refill  . acetaminophen (TYLENOL) 500 MG tablet Take 500 mg  by mouth every 6 (six) hours as needed.        Marland Kitchen BIOTIN FORTE PO Take 1 tablet by mouth daily.      Marland Kitchen BROMDAY 0.09 % SOLN Place 1 drop into the right eye daily.      . Calcium Carbonate-Vitamin D (CALCIUM 600 + D PO) Take 1 tablet by mouth 2 (two) times daily.       . Cholecalciferol (VITAMIN D) 1000 UNITS capsule Take 1,000 Units by mouth 2 (two) times daily.       . Cranberry 500 MG CAPS Take by mouth. 2 tabs daily       . dextromethorphan (DELSYM) 30 MG/5ML liquid Take 60 mg by mouth as needed.        Marland Kitchen esomeprazole (NEXIUM) 40 MG capsule Take 1 capsule (40 mg total) by mouth daily as needed.  30 capsule  3  . etodolac (LODINE) 500 MG tablet Take 500 mg by mouth daily.        Marland Kitchen levofloxacin (LEVAQUIN) 500 MG tablet Take 1 tablet (500 mg total) by mouth daily.  7 tablet  0  . levothyroxine (SYNTHROID, LEVOTHROID) 25 MCG tablet Take 25 mcg by mouth daily.        Marland Kitchen loratadine (CLARITIN) 10 MG tablet Take 10 mg by mouth daily.        . Magnesium 100 MG CAPS Take 1 capsule by mouth 2 (  two) times daily.       . Multiple Vitamin (MULTIVITAMIN) capsule Take 1 capsule by mouth daily.        . prednisoLONE sodium phosphate (INFLAMASE FORTE) 1 % ophthalmic solution Place 2 drops into the right eye 2 (two) times daily.      . predniSONE (DELTASONE) 20 MG tablet Take 2 tabs daily for 2 weeks then 1 tab daily  60 tablet  0  . pyridOXINE (VITAMIN B-6) 100 MG tablet Take 100 mg by mouth daily.        . ranitidine (ZANTAC) 150 MG tablet Take 150 mg by mouth 2 (two) times daily.       . tamoxifen (NOLVADEX) 20 MG tablet Take 20 mg by mouth daily.        . vitamin E 400 UNIT capsule Take 400 Units by mouth daily.           REVIEW OF SYSTEMS  : Positive for allergy trouble, arthritis, cough, fatigue, headaches, night sweats, shortness of breath, and sleeping problems. All other systems reviewed and negative except where noted in the History of Present Illness.   PHYSICAL EXAM: BP 142/84  Pulse 88  Ht 5'  2" (1.575 m)  Wt 188 lb 3.2 oz (85.367 kg)  BMI 34.42 kg/m2 General: Well developed white female in no acute distress Head: Normocephalic and atraumatic Eyes:  sclerae anicteric,conjunctive pink. Ears: Normal auditory acuity Mouth: No deformity or lesions Neck: Supple, no masses.  Lungs: Clear throughout to auscultation Heart: Regular rate and rhythm Abdomen: Soft, non distended, nontender. No masses or hepatomegaly noted. Normal Bowel sounds Rectal: not done Musculoskeletal: Symmetrical with no gross deformities  Skin: No lesions on visible extremities Extremities: No edema or deformities noted Neurological: Alert oriented x 4, grossly nonfocal Cervical Nodes:  No significant cervical adenopathy Psychological:  Alert and cooperative. Normal mood and affect  ASSESSMENT AND PLAN; 1. Recurrrent PNA. Pulmonary MD concerned patient may be aspirating. She may have laryngopharyngeal reflux but a 24 hour pH study is sometimes needed for definite diagnosis.    2. Intermittent solid food dysphagia, especially to meats and rice. Rule out stricture, dysmotility, etc.. Patient has a history of what sounds what like hiatal hernia repair with redo several years ago in [place]. Prior to pursuing an EGD with dilation I would prefer barium swallow with tablet to define her post-surgical anatomy. It should also reveal presence of stricture if she has one.  Will call patient with test results and further recommendations.   3. History of breast cancer, s/p lumpectomy and XRT. She is on Tamoxifen.    4.  History of radiation pneumonitis (breast cancer)  Addendum: Reviewed and agree with initial management. Plan barium study given prior reflux surgery. Joan Fiedler, MD

## 2012-03-06 NOTE — Patient Instructions (Signed)
We have scheduled the Barium Swallow for 03-12-2012 Tuesday at [facility] Radiology, 1st floor. Arrive at 10:45 Am.  You may have breakfast that morning.  We scheduled the Endoscopy with Dr. Erick Blinks on 03-19-2012. Directions provided.  Upper GI Endoscopy Upper GI endoscopy means using a flexible scope to look at the esophagus, stomach, and upper small bowel. This is done to make a diagnosis in people with heartburn, abdominal pain, or abnormal bleeding. Sometimes an endoscope is needed to remove foreign bodies or food that become stuck in the esophagus; it can also be used to take biopsy samples. For the best results, do not eat or drink for 8 hours before having your upper endoscopy.  To perform the endoscopy, you will probably be sedated and your throat will be numbed with a special spray. The endoscope is then slowly passed down your throat (this will not interfere with your breathing). An endoscopy exam takes 15 to 30 minutes to complete and there is no real pain. Patients rarely remember much about the procedure. The results of the test may take several days if a biopsy or other test is taken.  You may have a sore throat after an endoscopy exam. Serious complications are very rare. Stick to liquids and soft foods until your pain is better. Do not drive a car or operate any dangerous equipment for at least 24 hours after being sedated. SEEK IMMEDIATE MEDICAL CARE IF:   You have severe throat pain.   You have shortness of breath.   You have bleeding problems.   You have a fever.   You have difficulty recovering from your sedation.    .Barium Swallow A barium swallow is an X-ray examination. It evaluates only the area at the back of the throat (pharynx) and the "food pipe" that leads to the stomach (esophagus). For this study, the patient swallows a white chalky liquid called "barium". Barium blocks standard X-ray beams, It looks much different on X-ray film when compared to  nearby organs. Because of the way barium works, it is often called a "contrast material". It is an easy, fast, and safe method for getting X-ray pictures that can identify possible problems. A barium swallow helps in the evaluation of some digestive functions and to detect:  Ulcers.   Tumors.   Inflammation of the esophagus.   Hiatal hernias ( the upper portion of the stomach protrudes into the chest cavity through an opening of the diaphragm).   Scarring.   Blockages.   Problems with muscular wall of the pharynx and esophagus.  The procedure is also used to help diagnose symptoms such as:  Difficulty swallowing.   Chest and abdominal pain.   Reflux (a backward flow of partially digested food and digestive juices).   Unexplained vomiting.   Severe indigestion.  LET YOUR CAREGIVER KNOW ABOUT:   Allergies, especially to contrast materials.   Medications taken including herbs, eye drops, over-the-counter medications, and creams.   Use of steroids (by mouth or creams).   Possible pregnancy, if applicable.   History of blood clots (thrombophlebitis).   History of bleeding or blood problems.   Previous surgery.   Other health problems.  RISKS AND COMPLICATIONS  There is rare chance of cancer from radiation exposure. However, the benefit of an accurate diagnosis far outweighs the risk.   Some patients may be allergic to the flavoring added to some brands of barium. If you have experienced allergic reactions after eating chocolate, certain berries or citrus  fruit, be sure to tell your caregiver or the technologist before the procedure.   There is a rare chance that some barium could be retained leading to a blockage of the digestive system. Patients who have an obstruction in the gastrointestinal tract should not undergo this exam.   Women should always inform their physician or X-ray technologist if there is any possibility that they are pregnant.  BEFORE THE PROCEDURE    To ensure the best possible image quality, your stomach should be empty of food. You will likely be asked not to eat or drink anything and to refrain from chewing gum and smoking after midnight on the day of the examination. Your caregiver will provide specific instructions about taking or not taking regular prescription medications on the day of test. You may be asked to remove some or all of your clothes and to wear a gown during the exam. You may also be asked to remove jewelry, eye glasses, and any metal objects or clothing that might interfere with the X-ray images. PROCEDURE The patient drinks the liquid barium, which looks like a light-colored milkshake. The radiologist watches the barium pass through the digestive tract on a fluoroscope. This is a device that projects X-ray images in a movie-like sequence onto a monitor. The exam table will be positioned at different angles, and the abdomen may be compressed to help spread the barium. Once the esophagus is coated with the barium, still X-ray pictures are taken and stored for further review. It is important to hold still while the X-ray picture is taken. That will reduce the chances of a blurred image.  AFTER THE PROCEDURE  The barium may color your stools gray or white for 48 to 72 hours after the procedure. Sometimes the barium can cause temporary constipation. This condition is usually treated by an over-the-counter stool softener or laxative. Drinking more than normal amounts of fluids following the test can also help.  HOME CARE INSTRUCTIONS  After the examination, you can resume a regular diet and take orally administered medications unless told otherwise by your doctor.  SEEK MEDICAL CARE IF:   If you are unable to have a bowel movement.   If your bowel habits undergo any significant changes following the exam, you should contact your physician.   You experience worsening problems with swallowing, food "getting stuck", nausea, or pain.   SEEK IMMEDIATE MEDICAL CARE IF:   You develop worsening abdominal pain.   You develop vomiting.   You develop any chest pain, lightheadedness, or unusual sweating.   You develop weakness or you faint.  Document Released: 02/06/2007 Document Revised: 09/14/2011 Document Reviewed: 04/08/2007 [facility] Patient Information 2012 [place], [place].

## 2012-03-12 ENCOUNTER — Ambulatory Visit (HOSPITAL_COMMUNITY)
Admission: RE | Admit: 2012-03-12 | Discharge: 2012-03-12 | Disposition: A | Discharge status: Home / Self Care | Payer: Medicare Other | Source: Ambulatory Visit | Attending: Nurse Practitioner | Admitting: Nurse Practitioner

## 2012-03-12 DIAGNOSIS — Z8719 Personal history of other diseases of the digestive system: Secondary | ICD-10-CM | POA: Insufficient documentation

## 2012-03-12 DIAGNOSIS — K224 Dyskinesia of esophagus: Secondary | ICD-10-CM | POA: Insufficient documentation

## 2012-03-12 DIAGNOSIS — R1310 Dysphagia, unspecified: Secondary | ICD-10-CM | POA: Insufficient documentation

## 2012-03-12 DIAGNOSIS — K222 Esophageal obstruction: Secondary | ICD-10-CM | POA: Insufficient documentation

## 2012-03-12 DIAGNOSIS — K219 Gastro-esophageal reflux disease without esophagitis: Secondary | ICD-10-CM | POA: Insufficient documentation

## 2012-03-12 DIAGNOSIS — K2289 Other specified disease of esophagus: Secondary | ICD-10-CM | POA: Insufficient documentation

## 2012-03-12 DIAGNOSIS — K228 Other specified diseases of esophagus: Secondary | ICD-10-CM | POA: Insufficient documentation

## 2012-03-14 ENCOUNTER — Telehealth: Payer: Self-pay | Admitting: *Deleted

## 2012-03-14 NOTE — Telephone Encounter (Signed)
Patient calling for results of barium esophagram. Per Willette Cluster, NP Dr. Rhea Belton to review and decide if patient should keep appointment on 03/19/12 for EGD. Please, advise.

## 2012-03-14 NOTE — Telephone Encounter (Signed)
Patient calling for results of barium esophagram.

## 2012-03-14 NOTE — Telephone Encounter (Signed)
Spoke with patient and gave her Dr. Lauro Franklin recommendations.

## 2012-03-14 NOTE — Telephone Encounter (Signed)
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 There were no masses seen. 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 I think we should proceed with EGD as scheduled. Also, she may later need manometry to better characterize her dysmotility disorder. Thanks

## 2012-03-19 ENCOUNTER — Encounter: Payer: Self-pay | Admitting: Internal Medicine

## 2012-03-19 ENCOUNTER — Ambulatory Visit (AMBULATORY_SURGERY_CENTER): Payer: Medicare Other | Admitting: Internal Medicine

## 2012-03-19 VITALS — BP 172/99 | HR 94 | Temp 99.7°F | Resp 16 | Ht 62.0 in | Wt 188.0 lb

## 2012-03-19 DIAGNOSIS — K219 Gastro-esophageal reflux disease without esophagitis: Secondary | ICD-10-CM

## 2012-03-19 DIAGNOSIS — R1310 Dysphagia, unspecified: Secondary | ICD-10-CM

## 2012-03-19 DIAGNOSIS — K2970 Gastritis, unspecified, without bleeding: Secondary | ICD-10-CM

## 2012-03-19 DIAGNOSIS — K2990 Gastroduodenitis, unspecified, without bleeding: Secondary | ICD-10-CM

## 2012-03-19 MED ORDER — SODIUM CHLORIDE 0.9 % IV SOLN: 500.0000 mL | INTRAVENOUS | Status: DC

## 2012-03-19 NOTE — Patient Instructions (Signed)
YOU HAD AN ENDOSCOPIC PROCEDURE TODAY AT THE [facility]: Refer to the procedure report that was given to you for any specific questions about what was found during the examination.  If the procedure report does not answer your questions, please call your gastroenterologist to clarify.  If you requested that your care partner not be given the details of your procedure findings, then the procedure report has been included in a sealed envelope for you to review at your convenience later.  YOU SHOULD EXPECT: Some feelings of bloating in the abdomen. Passage of more gas than usual.  Walking can help get rid of the air that was put into your GI tract during the procedure and reduce the bloating. If you had a lower endoscopy (such as a colonoscopy or flexible sigmoidoscopy) you may notice spotting of blood in your stool or on the toilet paper. If you underwent a bowel prep for your procedure, then you may not have a normal bowel movement for a few days.  DIET: Dilation diet nothing by mouth until 3:20 p.m. Start with full to clear liquids for 1 hour and then at 4:20 p.m start soft diet fo remainder of day refer to hand out.  ACTIVITY: Your care partner should take you home directly after the procedure.  You should plan to take it easy, moving slowly for the rest of the day.  You can resume normal activity the day after the procedure however you should NOT DRIVE or use heavy machinery for 24 hours (because of the sedation medicines used during the test).    SYMPTOMS TO REPORT IMMEDIATELY: A gastroenterologist can be reached at any hour.  During normal business hours, 8:30 AM to 5:00 PM Monday through Friday, call [phone].  After hours and on weekends, please call the GI answering service at [phone] who will take a message and have the physician on call contact you.    Following upper endoscopy (EGD)  Vomiting of blood or coffee ground material  New chest pain or pain under the  shoulder blades  Painful or persistently difficult swallowing  New shortness of breath  Fever of 100F or higher  Black, tarry-looking stools  FOLLOW UP: If any biopsies were taken you will be contacted by phone or by letter within the next 1-3 weeks.  Call your gastroenterologist if you have not heard about the biopsies in 3 weeks.  Our staff will call the home number listed on your records the next business day following your procedure to check on you and address any questions or concerns that you may have at that time regarding the information given to you following your procedure. This is a courtesy call and so if there is no answer at the home number and we have not heard from you through the emergency physician on call, we will assume that you have returned to your regular daily activities without incident.  SIGNATURES/CONFIDENTIALITY: You and/or your care partner have signed paperwork which will be entered into your electronic medical record.  These signatures attest to the fact that that the information above on your After Visit Summary has been reviewed and is understood.  Full responsibility of the confidentiality of this discharge information lies with you and/or your care-partner.   Resume medications. Follow up with Dr. Rhea Belton in one month.

## 2012-03-19 NOTE — Progress Notes (Signed)
The pt tolerated the egd very well. Maw   

## 2012-03-19 NOTE — Op Note (Signed)
[facility] [address] [place], [place]  [address]  ENDOSCOPY PROCEDURE REPORT  PATIENT:  Joan, Weaver  MR#:  [id number] BIRTHDATE:  [birth year], 70 yrs. old  GENDER:  female ENDOSCOPIST:  Carie Caddy. Elier Zellars, MD Referred by:  Comer Locket Vassie Loll, M.D. PROCEDURE DATE:  03/19/2012 PROCEDURE:  EGD with balloon dilatation, EGD with biopsy, [address] ASA CLASS:  Class III INDICATIONS:  dysphagia, concern for aspiration, history of fundoplication (hiatal hernia repair) MEDICATIONS:   MAC sedation, administered by CRNA, propofol (Diprivan) 450 mg TOPICAL ANESTHETIC:  none  DESCRIPTION OF PROCEDURE:   After the risks benefits and alternatives of the procedure were thoroughly explained, informed consent was obtained.  The LB GIF-H180 [id number] endoscope was introduced through the mouth and advanced to the second portion of the duodenum, without limitations.  The instrument was slowly withdrawn as the mucosa was fully examined. <<PROCEDUREIMAGES>>  Tortuous in the distal esophagus without evidence for stricture. The adult upper endoscope passed easily into the stomach.  Empiric dilation was performed using 18 mm TTS balloon.  There were columnar-type mucosal changes in the distal esophagus, that could represent Barrett's esophagus (2 cm in length, 33 cm from incisors).  Multiple biopsies were obtained and sent to pathology. Mild gastritis was found antrum with a single erosion.  Biopsies of the antrum and body of the stomach were obtained and sent to pathology.  The duodenal bulb was normal in appearance, as was the postbulbar duodenum.  Retroflexion revealed no abnormalities other than evidence of prior intervention.  The scope was then withdrawn from the patient and the procedure completed. COMPLICATIONS:  None  ENDOSCOPIC IMPRESSION: 1) Tortuous in the distal esophagus without evidence for stricture.  Empiric dilation done with TTS balloon to 18 mm. 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 Multiple biopsies taken and sent to pathology. 3) Mild gastritis in the antrum.  Multiple biopsies taken and sent to pathology. 4) Normal duodenum  RECOMMENDATIONS: 1) Await pathology results 2) continue current medications 3) follow-up of helicobacter pylori status, treat if indicated 4) Return to clinic in about 1 month.  Carie Caddy. Rhea Belton, MD  CC:  Oretha Milch, MD Willette Cluster ACNP-BC The Patient  n. eSIGNEDCarie Caddy. Eymi Lipuma at 03/19/2012 02:20 PM  Melvenia Needles, [phone]

## 2012-03-19 NOTE — Progress Notes (Signed)
Patient did not experience any of the following events: a burn prior to discharge; a fall within the facility; wrong site/side/patient/procedure/implant event; or a hospital transfer or hospital admission upon discharge from the facility. (G8907) Patient did not have preoperative order for IV antibiotic SSI prophylaxis. (G8918)  

## 2012-03-20 ENCOUNTER — Telehealth: Payer: Self-pay

## 2012-03-20 NOTE — Telephone Encounter (Signed)
  Follow up Call-  Call back number 03/19/2012  Post procedure Call Back phone  # [phone]  Permission to leave phone message Yes     Patient questions:  Do you have a fever, pain , or abdominal swelling? no Pain Score  0 *  Have you tolerated food without any problems? yes  Have you been able to return to your normal activities? yes  Do you have any questions about your discharge instructions: Diet   no Medications  no Follow up visit  no  Do you have questions or concerns about your Care? no  Actions: * If pain score is 4 or above: No action needed, pain <4.  Per the pt "My chest is sore today".  I advised her it is normal to have soreness after having esophagus dilated.  I advised her to try warm water gargles and suck on chlorceptic lozengers.  If soreness is not improving to please call us back.  Pt states, "I will". Maw

## 2012-03-27 ENCOUNTER — Other Ambulatory Visit (HOSPITAL_BASED_OUTPATIENT_CLINIC_OR_DEPARTMENT_OTHER): Payer: Medicare Other | Admitting: Lab

## 2012-03-27 DIAGNOSIS — C50919 Malignant neoplasm of unspecified site of unspecified female breast: Secondary | ICD-10-CM

## 2012-03-27 DIAGNOSIS — M810 Age-related osteoporosis without current pathological fracture: Secondary | ICD-10-CM

## 2012-03-27 LAB — CBC WITH DIFFERENTIAL/PLATELET
Eosinophils Absolute: 0.1 10*3/uL (ref 0.0–0.5)
HCT: 40.3 % (ref 34.8–46.6)
LYMPH%: 15.7 % (ref 14.0–49.7)
MCV: 88.3 fL (ref 79.5–101.0)
MONO%: 7.1 % (ref 0.0–14.0)
NEUT#: 4.7 10*3/uL (ref 1.5–6.5)
NEUT%: 75.6 % (ref 38.4–76.8)
Platelets: 213 10*3/uL (ref 145–400)
RBC: 4.57 10*6/uL (ref 3.70–5.45)

## 2012-03-28 ENCOUNTER — Ambulatory Visit: Payer: Medicare Other | Admitting: Gastroenterology

## 2012-03-28 LAB — COMPREHENSIVE METABOLIC PANEL
Alkaline Phosphatase: 43 U/L (ref 39–117)
BUN: 17 mg/dL (ref 6–23)
CO2: 22 mEq/L (ref 19–32)
Creatinine, Ser: 0.92 mg/dL (ref 0.50–1.10)
Glucose, Bld: 113 mg/dL — ABNORMAL HIGH (ref 70–99)
Sodium: 142 mEq/L (ref 135–145)
Total Bilirubin: 0.4 mg/dL (ref 0.3–1.2)
Total Protein: 6 g/dL (ref 6.0–8.3)

## 2012-03-28 LAB — VITAMIN D 25 HYDROXY (VIT D DEFICIENCY, FRACTURES): Vit D, 25-Hydroxy: 30 ng/mL (ref 30–89)

## 2012-04-02 ENCOUNTER — Encounter: Payer: Self-pay | Admitting: Internal Medicine

## 2012-04-03 ENCOUNTER — Other Ambulatory Visit: Payer: Self-pay | Admitting: Oncology

## 2012-04-03 DIAGNOSIS — C50919 Malignant neoplasm of unspecified site of unspecified female breast: Secondary | ICD-10-CM

## 2012-04-04 ENCOUNTER — Ambulatory Visit (HOSPITAL_BASED_OUTPATIENT_CLINIC_OR_DEPARTMENT_OTHER): Payer: Medicare Other | Admitting: Oncology

## 2012-04-04 ENCOUNTER — Telehealth: Payer: Self-pay | Admitting: *Deleted

## 2012-04-04 VITALS — BP 139/79 | HR 96 | Temp 98.2°F | Ht 62.0 in | Wt 189.5 lb

## 2012-04-04 DIAGNOSIS — E559 Vitamin D deficiency, unspecified: Secondary | ICD-10-CM

## 2012-04-04 DIAGNOSIS — C50919 Malignant neoplasm of unspecified site of unspecified female breast: Secondary | ICD-10-CM

## 2012-04-04 DIAGNOSIS — C50419 Malignant neoplasm of upper-outer quadrant of unspecified female breast: Secondary | ICD-10-CM

## 2012-04-04 DIAGNOSIS — Z170 Estrogen receptor positive status [ER+]: Secondary | ICD-10-CM

## 2012-04-04 MED ORDER — TAMOXIFEN CITRATE 20 MG PO TABS: 20.0000 mg | ORAL_TABLET | Freq: Every day | ORAL | Status: DC

## 2012-04-04 NOTE — Telephone Encounter (Signed)
MADE PATIENT APPOINTMENT FOR BONE DENSITY AT [facility] ON 06-19-2012 AT MADE PATIENT APPOINTMENT FOR MD AND LAB ONE WEEK BEFORE THE MD APPOINTMENT

## 2012-04-05 ENCOUNTER — Encounter: Payer: Self-pay | Admitting: Pulmonary Disease

## 2012-04-05 ENCOUNTER — Ambulatory Visit (INDEPENDENT_AMBULATORY_CARE_PROVIDER_SITE_OTHER)
Admission: RE | Admit: 2012-04-05 | Discharge: 2012-04-05 | Disposition: A | Discharge status: Home / Self Care | Payer: Medicare Other | Source: Ambulatory Visit | Attending: Pulmonary Disease | Admitting: Pulmonary Disease

## 2012-04-05 ENCOUNTER — Ambulatory Visit (INDEPENDENT_AMBULATORY_CARE_PROVIDER_SITE_OTHER): Payer: Medicare Other | Admitting: Pulmonary Disease

## 2012-04-05 VITALS — BP 110/70 | HR 67 | Temp 98.3°F | Ht 62.0 in | Wt 190.6 lb

## 2012-04-05 DIAGNOSIS — J189 Pneumonia, unspecified organism: Secondary | ICD-10-CM

## 2012-04-05 DIAGNOSIS — K222 Esophageal obstruction: Secondary | ICD-10-CM

## 2012-04-05 NOTE — Progress Notes (Signed)
Subjective:    Patient ID: Joan Weaver, female    DOB: 1940/12/10, 71 y.o.   MRN: [id number]  HPI Oncologist : Donnie Coffin, Dr Mitzi Hansen   70/F, never smoker for FU of atypical pneumonitis - favor sub clinical aspiration.  Diagnosed with breast CA in July '11, underwent lumpectomy ([facility]) followed by RT , last nov'11.  She presented with pneumonia since Feb '12 not responding to antibiotics.  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  10/12 >>restarted on Pred 5mg   CT chest on 07/11/11 showed Multiple shifting areas of consolidation in both lungs. Areas  of consolidation seen on the prior chest CT have resolved and there are new, similar appearing areas of predominately peripheral consolidation. Dilated esophagus with Af level  >> increased prednisone 20mg  daily , added nexium for reflux sx - causes diarrhea  Prednisone tapered off 2/13  >pred 40mg  restarted 02/29/2012 , CXR RUL consolidation enlarged /symptomatic  DD includes GERD related aspiration >> advised GI consult given h/o hiatal hernia surgery in 2005  01/15/12  Off prednisone x 5 weeks  Cataract sx last week, On ride [place] from [place] trip to [place] - developed sharp RHC pain - rib fractures - not seen on cxr  CXR showed enlarging nodular opacity in the lateral right upper lobe compared to dec'12.   04/05/2012  02/29/2012 Acute OV 1 mnth ago -Complains of increased SOB, wheezing, prod cough with white/yellow mucus, sweats, shakiness x2 months, worse x2 weeks. Since last ov feels she has slowly been going down hill.  Increased DOE. Very weak w/ no  energy.  CXR  showed RUL consolidation . >> pred, levaquin, ESR  Still little SOB w/ activity, veyr little cough, occasional wheezing. Denies increased edema, vomittting, chest pain or discolored mucus. No hemoptysis, edema, chest pain or overt reflux. 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 Diffusely dilated esophagus.3. Severe esophageal dysmotility. As a result, a large amount of the ingested liquid remained in the esophagus throughout the examination, possibly placing the patient at risk for aspiration.4. Intermittent prominence of the cricopharyngeus muscle in the pharynx. No evidence of laryngeal penetration or tracheal aspiration with thin barium liquid.  EGD - Tortuous in the distal esophagus without evidence for stricture. Empiric dilation done with TTS balloon to 18 mm. 2) Barrett's, possible at the gastroesophageal junction     Review of Systems Patient denies significant dyspnea,cough, hemoptysis,  chest pain, palpitations, pedal edema, orthopnea, paroxysmal nocturnal dyspnea, lightheadedness, nausea, vomiting, abdominal or  leg pains      Objective:   Physical Exam  Gen. Pleasant, well-nourished, in no distress ENT - no lesions, no post nasal drip Neck: No JVD, no thyromegaly, no carotid bruits Lungs: no use of accessory muscles, no dullness to percussion, clear without rales or rhonchi  Cardiovascular: Rhythm regular, heart sounds  normal, no murmurs or gallops, no peripheral edema Musculoskeletal: No deformities, no cyanosis or clubbing         Assessment &  Plan:

## 2012-04-05 NOTE — Patient Instructions (Addendum)
Your symptoms are mostly due to aspiration/ reflux Sleep with HOB elevated Drop to 5 mg prednisone on July 15 th

## 2012-04-06 DIAGNOSIS — K222 Esophageal obstruction: Secondary | ICD-10-CM | POA: Insufficient documentation

## 2012-04-06 NOTE — Assessment & Plan Note (Signed)
Shifting/ new areas of consolidation that appear & improve are most consistent with aspiration episodes. CXR shows improved RUL consolidation - doubt further abx needed. I am not sure that prednisone is doing much here & will taper to off

## 2012-04-06 NOTE — Assessment & Plan Note (Signed)
Does this need repeat surgery given repeated episodes ? Will dilatation be enough ? Does she need motility studies if no stricture visualised on egd. I am convinced that this is the root cause of her problems over the past 1 1/2 years.

## 2012-04-07 NOTE — Progress Notes (Signed)
Hematology and Oncology Follow Up Visit  Joan Weaver [id number] March 26, 1941 71 y.o. 04/07/2012 1:39 PM   DIAGNOSIS: node-negative ER, ER positive breast cancer status post lumpectomy and radiation completed August 2011 complicated by pneumonitis and possible GERD, causing recurrent pneumonia  Encounter Diagnoses  Name Primary?  . Malignant neoplasm of breast (female), unspecified site Yes  . Unspecified vitamin D deficiency      PAST THERAPY: Lumpectomy radiation currently tamoxifen   Interim History:  Since being seen last the patient is been doing fairly well. She continues on ongoing therapy for suspected pneumonitis and/or BOOP. She has been seen on ongoing basis by the pulmonary service. She continues on moderate to high-dose prednisone. She has been evaluated for reflux and possible aspiration pneumonias. Overall performance status continues to be fairly good. She appears to be tolerating tamoxifen fairly well.  Medications: I have reviewed the patient's current medications.  Allergies:  Allergies  Allergen Reactions  . Advair Diskus (Fluticasone-Salmeterol)     Nervous and shakey  . Aspirin     If she takes too much, it burns her stomach  . Demerol Nausea Only    Past Medical History, Surgical history, Social history, and Family History were reviewed and updated.  Review of Systems: Constitutional:  Negative for fever, chills, night sweats, anorexia, weight loss, pain. Cardiovascular: positive for - dyspnea on exertion Respiratory: positive for - cough Neurological: negative Dermatological: negative ENT: negative Skin Gastrointestinal: negative Genito-Urinary: negative Hematological and Lymphatic: negative Breast: negative Musculoskeletal: negative Remaining ROS negative.  Physical Exam:  Blood pressure 139/79, pulse 96, temperature 98.2 F (36.8 C), height 5\' 2"  (1.575 m), weight 189 lb 8 oz (85.957 kg).  ECOG: 1 HEENT:  Sclerae anicteric, conjunctivae  pink.  Oropharynx clear.  No mucositis or candidiasis.  Nodes:  No cervical, supraclavicular, or axillary lymphadenopathy palpated.  Breast Exam:  Right breast is benign.  No masses, discharge, skin change, or nipple inversion.  Left breast is benign.  No masses, discharge, skin change, or nipple inversion..  Lungs:  Clear to auscultation bilaterally.  No crackles, rhonchi, or wheezes.  Heart:  Regular rate and rhythm.  Abdomen:  Soft, nontender.  Positive bowel sounds.  No organomegaly or masses palpated.  Musculoskeletal:  No focal spinal tenderness to palpation.  Extremities:  Benign.  No peripheral edema or cyanosis.  Skin:  Benign.  Neuro:  Nonfocal.     Lab Results: Lab Results  Component Value Date   WBC 6.2 03/27/2012   HGB 13.0 03/27/2012   HCT 40.3 03/27/2012   MCV 88.3 03/27/2012   PLT 213 03/27/2012     Chemistry      Component Value Date/Time   NA 142 03/27/2012 1021   K 3.8 03/27/2012 1021   CL 109 03/27/2012 1021   CO2 22 03/27/2012 1021   BUN 17 03/27/2012 1021   CREATININE 0.92 03/27/2012 1021      Component Value Date/Time   CALCIUM 8.6 03/27/2012 1021   ALKPHOS 43 03/27/2012 1021   AST 15 03/27/2012 1021   ALT 14 03/27/2012 1021   BILITOT 0.4 03/27/2012 1021       Radiological Studies:  Dg Chest 2 View  04/05/2012  *RADIOLOGY REPORT*  Clinical Data: History of weakness and shortness of breath. History of pneumonia follow up.  CHEST - 2 VIEW  Comparison: 02/29/2012.  CT 07/11/2011.  Findings: Cardiac silhouette is borderline size. Ectasia, tortuosity, and nonaneurysmal calcification of the thoracic aorta are seen. Hilar prominence most likely is  vascular and appears stable.  No pleural effusion is seen.  Airspace disease and associated pleural density is again evident in the right upper lobe and upper right hemithorax.  The infiltrative densities are slightly less extensive and are less dense than on the previous study indicating some improvement but clearing has not occurred  since 02/29/2012 study.  There is osteopenic appearance of the bones.  There is minimal degenerative spondylosis.  IMPRESSION: Borderline cardiac silhouette size.  Airspace disease and associated pleural density is again evident in the right upper lobe and upper right hemithorax.  The infiltrative densities are slightly less extensive and are less dense than on the previous study indicating some improvement but clearing has not occurred since 02/29/2012 study. Since clearing has not occurred, chronic pneumonia, atypical infection, and neoplastic process must be considered.  CT of the chest could be performed for additional evaluation.  Original Report Authenticated By: Crawford Givens, M.D.   Dg Esophagus  03/12/2012  *RADIOLOGY REPORT*  Clinical data:  History of Nissen fundoplication for hiatal hernia in 2004 while in [place]. [place].  The patient presents now with recurrent episodes of pneumonia, possibly secondary to aspiration.  ESOPHOGRAM / BARIUM SWALLOW  03/12/2012:  Technique:  Combined double contrast and single contrast examination performed using effervescent crystals, thick barium liquid, and thin barium liquid.  Comparison: None.  Findings:  Patient swallowed the thick and thin barium liquid without difficulty.  Moderate diffuse dilation of the esophagus with severe esophageal dysmotility, as evidenced by breakup of the primary wave in the proximal esophagus, numerous tertiary esophageal contractions, and to-and-fro esophageal motion.  This resulted in a large amount of the ingested liquid remaining in the esophagus throughout the examination.  Moderate stricture at the esophagogastric junction at the site of the Nissen wrap; because of the stricture, the 12.5 mm barium tablet was not given to the patient.  Gastroesophageal reflux was not elicited.  No esophageal masses.  Evaluation of the pharynx during rapid sequence imaging with swallows of thin barium liquid revealed intermittent slight prominence of the  cricopharyngeus muscle.  No evidence of laryngeal penetration or tracheal aspiration.  Fluoroscopy time:  1 minute 41 seconds, pulsed fluoroscopy  IMPRESSION:  1.  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 2.  Diffusely dilated esophagus. 3.  Severe esophageal dysmotility.  As a result, a large amount of the ingested liquid remained in the esophagus throughout the examination, possibly placing the patient at risk for aspiration. 4.  Intermittent prominence of the cricopharyngeus muscle in the pharynx.  No evidence of laryngeal penetration or tracheal aspiration with thin barium liquid.  Preliminary results were discussed with the patient at the time of the examination.  Original Report Authenticated By: Arnell Sieving, M.D.     IMPRESSIONS AND PLAN: A 71 y.o. female with   History of node-negative ER/PR positive breast cancer on tamoxifen. She has ongoing issues with her respiration status and suspected to have recurrent pneumonitis felt to be secondary to previous breast radiation. This is now 2 years out and so I am more suspicious that this may be related to underlying GI issues. She had recent EGD. In addition she had a barium swallow which confirmed that she does have esophageal motility issues and has had a previous Nissen procedure. She'll continue on tamoxifen which appears to be tolerating well. I will see her in 6 months time.  Spent more than half the time coordinating care, as well as discussion of  BMI and its implications.      Joan Weaver 6/30/20131:39 PM Cell [id number]

## 2012-04-18 ENCOUNTER — Encounter: Payer: Self-pay | Admitting: Internal Medicine

## 2012-04-19 ENCOUNTER — Ambulatory Visit (INDEPENDENT_AMBULATORY_CARE_PROVIDER_SITE_OTHER): Payer: Medicare Other | Admitting: Internal Medicine

## 2012-04-19 ENCOUNTER — Encounter: Payer: Self-pay | Admitting: Internal Medicine

## 2012-04-19 VITALS — BP 132/60 | HR 76 | Ht 62.0 in | Wt 190.0 lb

## 2012-04-19 DIAGNOSIS — R1310 Dysphagia, unspecified: Secondary | ICD-10-CM

## 2012-04-19 DIAGNOSIS — R42 Dizziness and giddiness: Secondary | ICD-10-CM

## 2012-04-19 DIAGNOSIS — K224 Dyskinesia of esophagus: Secondary | ICD-10-CM

## 2012-04-19 MED ORDER — MECLIZINE HCL 25 MG PO TABS: 25.0000 mg | ORAL_TABLET | Freq: Two times a day (BID) | ORAL | Status: AC | PRN

## 2012-04-19 NOTE — Progress Notes (Signed)
Patient ID: Joan Weaver, female   DOB: [birth year], 71 y.o.   MRN: [id number]  SUBJECTIVE: HPI Joan Weaver is a 71 yo female with PMH of breast cancer, hypothyroidism, osteoporosis, GERD status post Nissen fundoplication, esophageal dysmotility, aspiration pneumonia who seen in followup. She underwent upper endoscopy on 03/19/2012 which revealed tortuous distal esophagus without definite stricture. Empiric dilation was performed with a balloon to 18 mm. There was a segment of possible Barrett's esophagus in the distal esophagus which was biopsied and negative for Barrett's without any metaplasia. She had mild gastritis which was benign without H. pylori by pathology and a normal examined duodenum. She returns today stating that her dysphagia has improved since dilation. She had about a week of lower chest discomfort after dilation but this resolved. She still notes belching after eating and drinking, but no definite reflux or water brash. She does occasionally have heartburn, but is continuing to use ranitidine 150 mg twice daily. She's not using PPI on routine basis. She has been trouble with vertigo since last being seen and this was associated with nausea. She also was able to vomit, which she notes she had not been able to do since her antireflux surgery. This vomiting was associated with her vertigo. She's not had nausea or vomiting recently. She does report some loose stools, which have been somewhat chronic for her. She recalls her colonoscopy being last done in 2010 reportedly normal. She has been adding psyllium to her yogurt in the morning and this is helped bulk her stool. She has not had diarrhea in 3 days and started the psyllium 3 days ago. No melena or rectal bleeding.  She does continue to have poor exercise tolerance and feels short of breath with any exertion. Cough is not a big issue today. She does not feel dyspneic today.  Review of Systems  As per history of present illness, otherwise  negative   Past Medical History  Diagnosis Date  . Breast cancer August 2011  . Thyroid disease   . Hypertension   . Osteoporosis   . Arthritis   . Hernia   . Cataract   . Ulcer     Current Outpatient Prescriptions  Medication Sig Dispense Refill  . acetaminophen (TYLENOL) 500 MG tablet Take 500 mg by mouth every 6 (six) hours as needed.        Marland Kitchen BIOTIN FORTE PO Take 1 tablet by mouth daily.      . Calcium Carbonate-Vitamin D (CALCIUM 600 + D PO) Take 1 tablet by mouth 2 (two) times daily.       . Cholecalciferol (VITAMIN D) 1000 UNITS capsule Take 1,000 Units by mouth 2 (two) times daily.       . Cranberry 500 MG CAPS Take by mouth. 2 tabs daily       . dextromethorphan (DELSYM) 30 MG/5ML liquid Take 60 mg by mouth as needed.        Marland Kitchen esomeprazole (NEXIUM) 40 MG capsule Take 1 capsule (40 mg total) by mouth daily as needed.  30 capsule  3  . etodolac (LODINE) 500 MG tablet Take 500 mg by mouth daily.        Marland Kitchen levothyroxine (SYNTHROID, LEVOTHROID) 25 MCG tablet Take 25 mcg by mouth daily.        Marland Kitchen loratadine (CLARITIN) 10 MG tablet Take 10 mg by mouth daily.        . Magnesium 100 MG CAPS Take 1 capsule by mouth 2 (two) times daily.       Marland Kitchen  Multiple Vitamin (MULTIVITAMIN) capsule Take 1 capsule by mouth daily.        . predniSONE (DELTASONE) 20 MG tablet Take 2 tabs daily for 2 weeks then 1 tab daily  60 tablet  0  . pyridOXINE (VITAMIN B-6) 100 MG tablet Take 100 mg by mouth daily.        . ranitidine (ZANTAC) 150 MG tablet Take 150 mg by mouth 2 (two) times daily.       . tamoxifen (NOLVADEX) 20 MG tablet Take 1 tablet (20 mg total) by mouth daily.  30 tablet  6  . vitamin E 400 UNIT capsule Take 400 Units by mouth daily.        . meclizine (ANTIVERT) 25 MG tablet Take 1 tablet (25 mg total) by mouth 2 (two) times daily as needed.  30 tablet  0    Allergies  Allergen Reactions  . Advair Diskus (Fluticasone-Salmeterol)     Nervous and shakey  . Aspirin     If she takes too  much, it burns her stomach  . Demerol Nausea Only    Family History  Problem Relation Age of Onset  . Prostate cancer Maternal Grandfather   . Prostate cancer Paternal Grandfather   . Rheum arthritis Mother   . Heart disease Maternal Grandmother     History  Substance Use Topics  . Smoking status: Never Smoker   . Smokeless tobacco: Never Used  . Alcohol Use: No    OBJECTIVE: BP 132/60  Pulse 76  Ht 5\' 2"  (1.575 m)  Wt 190 lb (86.183 kg)  BMI 34.75 kg/m2 Constitutional: Well-developed and well-nourished. No distress. HEENT: Normocephalic and atraumatic. Oropharynx is clear and moist. No oropharyngeal exudate. Conjunctivae are normal. No scleral icterus. Neck: Neck supple. Trachea midline. Cardiovascular: Normal rate, regular rhythm and intact distal pulses. No M/R/G Pulmonary/chest: Effort normal and breath sounds normal. No wheezing, rales or rhonchi. Abdominal: Soft, nontender, nondistended. Bowel sounds active throughout. There are no masses palpable. No hepatosplenomegaly. Extremities: no clubbing, cyanosis, or edema Neurological: Alert and oriented to person place and time. Psychiatric: Normal mood and affect. Behavior is normal.  Labs and Imaging -- Barium esophagram 03/06/2012 ESOPHOGRAM / BARIUM SWALLOW  03/12/2012:   Technique:  Combined double contrast and single contrast examination performed using effervescent crystals, thick barium liquid, and thin barium liquid.   Comparison: None.   Findings:  Patient swallowed the thick and thin barium liquid without difficulty.  Moderate diffuse dilation of the esophagus with severe esophageal dysmotility, as evidenced by breakup of the primary wave in the proximal esophagus, numerous tertiary esophageal contractions, and to-and-fro esophageal motion.  This resulted in a large amount of the ingested liquid remaining in the esophagus throughout the examination.  Moderate stricture at the esophagogastric junction at  the site of the Nissen wrap; because of the stricture, the 12.5 mm barium tablet was not given to the patient.  Gastroesophageal reflux was not elicited.  No esophageal masses.   Evaluation of the pharynx during rapid sequence imaging with swallows of thin barium liquid revealed intermittent slight prominence of the cricopharyngeus muscle.  No evidence of laryngeal penetration or tracheal aspiration.   Fluoroscopy time:  1 minute 41 seconds, pulsed fluoroscopy   IMPRESSION:   1.  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 2.  Diffusely dilated esophagus. 3.  Severe esophageal dysmotility.  As a result, a large amount of the ingested liquid remained in the esophagus throughout the  examination, possibly placing the patient at risk for aspiration. 4.  Intermittent prominence of the cricopharyngeus muscle in the pharynx.  No evidence of laryngeal penetration or tracheal aspiration with thin barium liquid.   Preliminary results were discussed with the patient at the time of the examination.   ASSESSMENT AND PLAN: Mrs. Viar is a 71 yo female with PMH of breast cancer, hypothyroidism, osteoporosis, GERD status post Nissen fundoplication, esophageal dysmotility, aspiration pneumonia who seen in followup  1. Esophageal dysmotility/recurrent aspiration/esophageal stricture with hx of anti-reflux surgery -- the patient's swallowing dysfunction is multifactorial and unfortunately an overall difficult situation. She does have a history of reflux disease and underwent fundoplication in [place] some years ago. She reports having undergone esophageal manometry prior to this surgery. Subsequently she has suffered with recurrent aspiration events requiring antibiotics. Her two worst episodes were in March 2012 and 2013.  The barium swallow revealed severe esophageal dysmotility and a stricture at the GE junction. She is now status post dilation  of that stricture and her dysphagia has improved. She was able to vomit in the last several weeks, which indicates her GE junction is looser after dilation. This likely has helped her dysphagia symptoms, but it is unlikely to help her risk for aspiration. Her aspiration is likely the result of very poor esophageal motility, rather than ongoing episodes of reflux. It is likely that fluid pools in her esophagus, remaining therefore some time, increasing the risk of aspiration events. Unfortunately, there is very little medical therapy for esophageal dysmotility. We discussed a referral to [facility] for repeat esophageal manometry and impedance testing, but I'm not sure that the information gleaned from this test could translate into any additional therapy. However, if she does continue to have aspiration events, then I will plan to refer her for evaluation at [facility] just to ensure there is nothing else that can be done. For now, she prefers a wait and see approach, and I'll see her back in 3 months time, sooner if necessary.

## 2012-04-19 NOTE — Patient Instructions (Addendum)
We have sent the following medications to your pharmacy for you to pick up at your convenience: Meclazine;  Take twice a day as needed

## 2012-06-07 ENCOUNTER — Ambulatory Visit (INDEPENDENT_AMBULATORY_CARE_PROVIDER_SITE_OTHER)
Admission: RE | Admit: 2012-06-07 | Discharge: 2012-06-07 | Disposition: A | Discharge status: Home / Self Care | Payer: Medicare Other | Source: Ambulatory Visit | Attending: Pulmonary Disease | Admitting: Pulmonary Disease

## 2012-06-07 ENCOUNTER — Ambulatory Visit (INDEPENDENT_AMBULATORY_CARE_PROVIDER_SITE_OTHER): Payer: Medicare Other | Admitting: Pulmonary Disease

## 2012-06-07 ENCOUNTER — Encounter: Payer: Self-pay | Admitting: Pulmonary Disease

## 2012-06-07 VITALS — BP 140/90 | HR 80 | Temp 97.7°F | Ht 61.0 in | Wt 193.6 lb

## 2012-06-07 DIAGNOSIS — J189 Pneumonia, unspecified organism: Secondary | ICD-10-CM

## 2012-06-07 MED ORDER — AMOXICILLIN-POT CLAVULANATE 875-125 MG PO TABS: 1.0000 | ORAL_TABLET | Freq: Two times a day (BID) | ORAL | Status: AC

## 2012-06-07 NOTE — Progress Notes (Signed)
  Subjective:    Patient ID: Joan Weaver, female    DOB: 1941/03/10, 71 y.o.   MRN: [id number]  HPI  Oncologist : Donnie Coffin, Dr Mitzi Hansen   70/F, never smoker for FU of atypical pneumonitis - favor sub clinical aspiration.  Diagnosed with breast CA in July '11, underwent lumpectomy ([facility]) followed by RT , last nov'11.  She presented with pneumonia since Feb '12 not responding to antibiotics.  Transbronchial biopsy 3/12  c/w with organizing pna -neg for BOOP, complicated by pneumothorax  On steroids on & off since 3/12 -off x 5 wks 4/13    Serial CT chest  3/12, 10/12 showed Multiple shifting areas of consolidation in both lungs. Areas  of consolidation seen on the prior chest CT have resolved and there are new, similar appearing areas of predominately peripheral consolidation. 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 Severe esophageal dysmotility. As a result, a large amount of the ingested liquid remained in the esophagus throughout the examination, possibly placing the patient at risk for aspiration. Intermittent prominence of the cricopharyngeus muscle in the pharynx. No evidence of laryngeal penetration or tracheal aspiration with thin barium liquid.  EGD - Tortuous in the distal esophagus without evidence for stricture. Empiric dilation done- Barrett's, possible at the gastroesophageal junction   06/07/12 2 m FU  breathing is little better--still can't walk very far w/o getting SOB, slight wheezing when she is SOB, occasional cough. down to 5mg  of prednisone a day CXR -  nodular area in left base appear chronic and unchanged. Noncalcific apical pleural thickening bilaterally appears unchanged. Streaky opacities within the right upper lobe  appear chronic.    Review  of Systems neg for any significant sore throat, dysphagia, itching, sneezing, nasal congestion or excess/ purulent secretions, fever, chills, sweats, unintended wt loss, pleuritic or exertional cp, hempoptysis, orthopnea pnd or change in chronic leg swelling. Also denies presyncope, palpitations, heartburn, abdominal pain, nausea, vomiting, diarrhea or change in bowel or urinary habits, dysuria,hematuria, rash, arthralgias, visual complaints, headache, numbness weakness or ataxia.     Objective:   Physical Exam  Gen. Pleasant, well-nourished, in no distress ENT - no lesions, no post nasal drip Neck: No JVD, no thyromegaly, no carotid bruits Lungs: no use of accessory muscles, no dullness to percussion, clear without rales or rhonchi  Cardiovascular: Rhythm regular, heart sounds  normal, no murmurs or gallops, no peripheral edema Musculoskeletal: No deformities, no cyanosis or clubbing        Assessment & Plan:

## 2012-06-07 NOTE — Patient Instructions (Addendum)
Decrease prednisone to 5 mg every m/w/f x 2 weeks then stop Rx for augmentin 875 bid x 7ds will be sent to your pharmacy  - Take this if fever > 100, colored phlegm or worsening congestion not responding to mucinex Call if symptoms worse

## 2012-06-09 NOTE — Assessment & Plan Note (Addendum)
Favor aspiration episodes over BOOP &   radiation pneumonitis CT chest 07/07/11 Multiple shifting areas of consolidation in both lungs. Areas of consolidation seen on the prior chest CT have resolved and there are new, similar appearing areas of predominately peripheral consolidation .  CXR 06/07/12  -unchanged rt UL  & lt base infiltrates  Taper prednisone to off  We discussed self administration of empiric antibiotics should her symptoms worsen Rx for augmentin 875 bid x 7ds will be sent to her pharmacy  - Take this if fever > 100, colored phlegm or worsening congestion not responding to mucinex Call if symptoms worse

## 2012-06-11 ENCOUNTER — Telehealth: Payer: Self-pay | Admitting: Pulmonary Disease

## 2012-06-11 NOTE — Telephone Encounter (Signed)
Notes Recorded by Oretha Milch, MD on 06/07/2012 at 6:42 PM No acute process  pt advised.Joan Weaver, CMA

## 2012-06-20 ENCOUNTER — Encounter: Payer: Self-pay | Admitting: Internal Medicine

## 2012-06-21 ENCOUNTER — Ambulatory Visit (INDEPENDENT_AMBULATORY_CARE_PROVIDER_SITE_OTHER): Payer: Medicare Other | Admitting: Internal Medicine

## 2012-06-21 ENCOUNTER — Encounter: Payer: Self-pay | Admitting: Internal Medicine

## 2012-06-21 VITALS — BP 126/82 | HR 104 | Ht 61.0 in | Wt 191.4 lb

## 2012-06-21 DIAGNOSIS — K219 Gastro-esophageal reflux disease without esophagitis: Secondary | ICD-10-CM

## 2012-06-21 DIAGNOSIS — Z8709 Personal history of other diseases of the respiratory system: Secondary | ICD-10-CM

## 2012-06-21 DIAGNOSIS — K224 Dyskinesia of esophagus: Secondary | ICD-10-CM

## 2012-06-21 NOTE — Progress Notes (Signed)
Subjective:    Patient ID: Joan Weaver, female    DOB: May 21, 1941, 71 y.o.   MRN: [id number]  HPI  SUBJECTIVE: HPI Joan Weaver is a 71 yo female with PMH of breast cancer, hypothyroidism, osteoporosis, GERD status post Nissen fundoplication, esophageal dysmotility, aspiration pneumonia who seen in followup. From a GI standpoint she is doing about the same. She continues to have intermittent issues with solid food dysphagia, and the feeling of air being trapped in her lower chest, which gives her an interim "choking" sensation. She has used Gaviscon for this with nice symptom relief.  She continues on Nexium and is having very little heartburn. She does have regurgitation of liquids on occasion. No trouble with nausea or vomiting. No change in bowel habits, rectal bleeding or melena. No fevers or chills.  No recent new pulmonary issues including no further episodes of pneumonia. Cough is not a big issue for her. She is weaning off of prednisone and she is excited to be off of this medication. She is hopeful that she can remain off for an extended period. She did see Dr. Vassie Loll recently  Review of Systems  As per history of present illness, otherwise negative   Past Medical History  Diagnosis Date  . Breast cancer August 2011  . Thyroid disease   . Hypertension   . Osteoporosis   . Arthritis   . Hernia   . Cataract   . Ulcer     Current Outpatient Prescriptions  Medication Sig Dispense Refill  . acetaminophen (TYLENOL) 500 MG tablet Take 500 mg by mouth every 6 (six) hours as needed.        Marland Kitchen BIOTIN FORTE PO Take 1 tablet by mouth daily.      . Calcium Carbonate-Vitamin D (CALCIUM 600 + D PO) Take 1 tablet by mouth 2 (two) times daily.       . celecoxib (CELEBREX) 200 MG capsule Take 200 mg by mouth daily.      . Cholecalciferol (VITAMIN D) 1000 UNITS capsule Take 1,000 Units by mouth 2 (two) times daily.       . Cranberry 500 MG CAPS Take by mouth. 2 tabs daily       .  dextromethorphan (DELSYM) 30 MG/5ML liquid Take 60 mg by mouth as needed.        Marland Kitchen esomeprazole (NEXIUM) 40 MG capsule Take 1 capsule (40 mg total) by mouth daily as needed.  30 capsule  3  . levothyroxine (SYNTHROID, LEVOTHROID) 25 MCG tablet Take 25 mcg by mouth daily.        Marland Kitchen loratadine (CLARITIN) 10 MG tablet Take 10 mg by mouth daily.        . Magnesium 100 MG CAPS Take 1 capsule by mouth 2 (two) times daily.       . meclizine (ANTIVERT) 25 MG tablet As needed      . Multiple Vitamin (MULTIVITAMIN) capsule Take 1 capsule by mouth daily.        . predniSONE (DELTASONE) 20 MG tablet Take 5 mg by mouth daily.      Marland Kitchen pyridOXINE (VITAMIN B-6) 100 MG tablet Take 100 mg by mouth daily.        . ranitidine (ZANTAC) 150 MG tablet Take 150 mg by mouth 2 (two) times daily.       . tamoxifen (NOLVADEX) 20 MG tablet Take 1 tablet (20 mg total) by mouth daily.  30 tablet  6  . vitamin E 400 UNIT capsule Take  400 Units by mouth daily.          Allergies  Allergen Reactions  . Advair Diskus (Fluticasone-Salmeterol)     Nervous and shakey  . Aspirin     If she takes too much, it burns her stomach  . Demerol Nausea Only    Family History  Problem Relation Age of Onset  . Prostate cancer Maternal Grandfather   . Prostate cancer Paternal Grandfather   . Rheum arthritis Mother   . Heart disease Maternal Grandmother     History  Substance Use Topics  . Smoking status: Never Smoker   . Smokeless tobacco: Never Used  . Alcohol Use: No    OBJECTIVE: BP 126/82  Pulse 104  Ht 5\' 1"  (1.549 m)  Wt 191 lb 6 oz (86.807 kg)  BMI 36.16 kg/m2 Constitutional: Well-developed and well-nourished. No distress.  HEENT: Normocephalic and atraumatic. Oropharynx is clear and moist. No oropharyngeal exudate. Conjunctivae are normal. No scleral icterus.  Neck: Neck supple. Trachea midline.  Cardiovascular: Normal rate, regular rhythm and intact distal pulses. No M/R/G  Pulmonary/chest: Effort normal and  breath sounds normal. No wheezing, rales or rhonchi.  Abdominal: Soft, nontender, nondistended. Bowel sounds active throughout. There are no masses palpable. No hepatosplenomegaly.  Extremities: no clubbing, cyanosis, or edema  Neurological: Alert and oriented to person place and time.  Psychiatric: Normal mood and affect. Behavior is normal.  ASSESSMENT AND PLAN: Joan Weaver is a 71 yo female with PMH of breast cancer, hypothyroidism, osteoporosis, GERD status post Nissen fundoplication, esophageal dysmotility, aspiration pneumonia who seen in followup   1. Esophageal dysmotility/recurrent aspiration/esophageal stricture with hx of anti-reflux surgery --  symptoms are stable, and again we have discussed how esophageal dysmotility is likely the cause of her ongoing symptoms. She is status post dilation of her GE junction 18 mm in June 2013. We have again discussed further evaluation with esophageal manometry and impedance, which would be done at [facility], but for now she continues to desire a wait and see approach. She feels that her symptoms are stable, and does not desire further testing at this time. This is reasonable. She will continue to follow with pulmonary, and hopefully continue to avoid recurrent aspiration events. She continue to use her current dose of PPI, when necessary Gaviscon, and when necessary peppermint altoids for esophageal spasm. She will contact us should symptoms worsen or she desire followup.  Review of Systems     Objective:   Physical Exam        Assessment & Plan:

## 2012-06-21 NOTE — Patient Instructions (Addendum)
Follow up with Dr. Rhea Belton as needed.  Continue current antacids.

## 2012-07-29 ENCOUNTER — Other Ambulatory Visit: Payer: Self-pay | Admitting: *Deleted

## 2012-07-29 DIAGNOSIS — C50919 Malignant neoplasm of unspecified site of unspecified female breast: Secondary | ICD-10-CM

## 2012-07-30 ENCOUNTER — Other Ambulatory Visit (HOSPITAL_BASED_OUTPATIENT_CLINIC_OR_DEPARTMENT_OTHER): Payer: Medicare Other | Admitting: Lab

## 2012-07-30 DIAGNOSIS — C50919 Malignant neoplasm of unspecified site of unspecified female breast: Secondary | ICD-10-CM

## 2012-07-30 DIAGNOSIS — M810 Age-related osteoporosis without current pathological fracture: Secondary | ICD-10-CM

## 2012-07-30 LAB — CBC WITH DIFFERENTIAL/PLATELET
Basophils Absolute: 0.1 10*3/uL (ref 0.0–0.1)
EOS%: 2.7 % (ref 0.0–7.0)
Eosinophils Absolute: 0.1 10*3/uL (ref 0.0–0.5)
HCT: 41 % (ref 34.8–46.6)
HGB: 13.9 g/dL (ref 11.6–15.9)
MCH: 30.7 pg (ref 25.1–34.0)
MCV: 90.8 fL (ref 79.5–101.0)
MONO%: 12 % (ref 0.0–14.0)
NEUT#: 2.3 10*3/uL (ref 1.5–6.5)
NEUT%: 55.1 % (ref 38.4–76.8)
RDW: 13.5 % (ref 11.2–14.5)
lymph#: 1.2 10*3/uL (ref 0.9–3.3)

## 2012-07-30 LAB — COMPREHENSIVE METABOLIC PANEL (CC13)
Albumin: 3.4 g/dL — ABNORMAL LOW (ref 3.5–5.0)
BUN: 16 mg/dL (ref 7.0–26.0)
Calcium: 9.3 mg/dL (ref 8.4–10.4)
Chloride: 108 mEq/L — ABNORMAL HIGH (ref 98–107)
Creatinine: 0.9 mg/dL (ref 0.6–1.1)
Glucose: 89 mg/dl (ref 70–99)
Potassium: 4.3 mEq/L (ref 3.5–5.1)

## 2012-07-31 LAB — CANCER ANTIGEN 27.29: CA 27.29: 31 U/mL (ref 0–39)

## 2012-08-06 ENCOUNTER — Ambulatory Visit (HOSPITAL_BASED_OUTPATIENT_CLINIC_OR_DEPARTMENT_OTHER): Payer: Medicare Other | Admitting: Oncology

## 2012-08-06 ENCOUNTER — Telehealth: Payer: Self-pay | Admitting: Oncology

## 2012-08-06 VITALS — BP 118/79 | HR 111 | Temp 97.9°F | Resp 20 | Ht 61.0 in | Wt 186.3 lb

## 2012-08-06 DIAGNOSIS — C50419 Malignant neoplasm of upper-outer quadrant of unspecified female breast: Secondary | ICD-10-CM

## 2012-08-06 DIAGNOSIS — M899 Disorder of bone, unspecified: Secondary | ICD-10-CM

## 2012-08-06 DIAGNOSIS — E559 Vitamin D deficiency, unspecified: Secondary | ICD-10-CM

## 2012-08-06 DIAGNOSIS — M949 Disorder of cartilage, unspecified: Secondary | ICD-10-CM

## 2012-08-06 DIAGNOSIS — Z170 Estrogen receptor positive status [ER+]: Secondary | ICD-10-CM

## 2012-08-06 DIAGNOSIS — C50919 Malignant neoplasm of unspecified site of unspecified female breast: Secondary | ICD-10-CM

## 2012-08-06 NOTE — Telephone Encounter (Signed)
gve the pt her April 2014 appt calendar °

## 2012-08-06 NOTE — Progress Notes (Signed)
Hematology and Oncology Follow Up Visit  Joan Weaver [id number] [birth year] 71 y.o. 08/06/2012 2:10 PM   DIAGNOSIS: node-negative ER, ER positive breast cancer status post lumpectomy and radiation completed August 2011 complicated by pneumonitis and possible GERD, causing recurrent pneumonia    PAST THERAPY: Lumpectomy radiation currently tamoxifen   Interim History:  Since being seen last the patient is been doing fairly well. She has not had problems with bone pain joint pains. She was on Celebrex and then this made her legs and also she stop this. She's also completed a course of prednisone. She her breathing is that she settle down and is not much problems related to that. She had mammogram in the spring and a bone density test which we reviewed today. She does take vitamin B. Medications: I have reviewed the patient's current medications.  Allergies:  Allergies  Allergen Reactions  . Advair Diskus (Fluticasone-Salmeterol)     Nervous and shakey  . Aspirin     If she takes too much, it burns her stomach  . Demerol Nausea Only    Past Medical History, Surgical history, Social history, and Family History were reviewed and updated.  Review of Systems: Constitutional:  Negative for fever, chills, night sweats, anorexia, weight loss, pain. Cardiovascular: positive for - dyspnea on exertion Respiratory: positive for - cough Neurological: negative Dermatological: negative ENT: negative Skin Gastrointestinal: negative Genito-Urinary: negative Hematological and Lymphatic: negative Breast: negative Musculoskeletal: negative Remaining ROS negative.  Physical Exam:  Blood pressure 118/79, pulse 111, temperature 97.9 F (36.6 C), resp. rate 20, height 5\' 1"  (1.549 m), weight 186 lb 4.8 oz (84.505 kg).  ECOG: 1 HEENT:  Sclerae anicteric, conjunctivae pink.  Oropharynx clear.  No mucositis or candidiasis.  Nodes:  No cervical, supraclavicular, or axillary lymphadenopathy  palpated.  Breast Exam:  Right breast is benign.  No masses, discharge, skin change, or nipple inversion.  Left breast is benign.  No masses, discharge, skin change, or nipple inversion..  Lungs:  Clear to auscultation bilaterally.  No crackles, rhonchi, or wheezes.  Heart:  Regular rate and rhythm.  Abdomen:  Soft, nontender.  Positive bowel sounds.  No organomegaly or masses palpated.  Musculoskeletal:  No focal spinal tenderness to palpation.  Extremities:  Benign.  No peripheral edema or cyanosis.  Skin:  Benign.  Neuro:  Nonfocal.     Lab Results: Lab Results  Component Value Date   WBC 4.1 07/30/2012   HGB 13.9 07/30/2012   HCT 41.0 07/30/2012   MCV 90.8 07/30/2012   PLT 180 07/30/2012     Chemistry      Component Value Date/Time   NA 141 07/30/2012 1318   NA 142 03/27/2012 1021   K 4.3 07/30/2012 1318   K 3.8 03/27/2012 1021   CL 108* 07/30/2012 1318   CL 109 03/27/2012 1021   CO2 24 07/30/2012 1318   CO2 22 03/27/2012 1021   BUN 16.0 07/30/2012 1318   BUN 17 03/27/2012 1021   CREATININE 0.9 07/30/2012 1318   CREATININE 0.92 03/27/2012 1021      Component Value Date/Time   CALCIUM 9.3 07/30/2012 1318   CALCIUM 8.6 03/27/2012 1021   ALKPHOS 53 07/30/2012 1318   ALKPHOS 43 03/27/2012 1021   AST 20 07/30/2012 1318   AST 15 03/27/2012 1021   ALT 13 07/30/2012 1318   ALT 14 03/27/2012 1021   BILITOT 0.40 07/30/2012 1318   BILITOT 0.4 03/27/2012 1021       Radiological Studies:  Dg Chest 2 View  04/05/2012  *RADIOLOGY REPORT*  Clinical Data: History of weakness and shortness of breath. History of pneumonia follow up.  CHEST - 2 VIEW  Comparison: 02/29/2012.  CT 07/11/2011.  Findings: Cardiac silhouette is borderline size. Ectasia, tortuosity, and nonaneurysmal calcification of the thoracic aorta are seen. Hilar prominence most likely is vascular and appears stable.  No pleural effusion is seen.  Airspace disease and associated pleural density is again evident in the right upper  lobe and upper right hemithorax.  The infiltrative densities are slightly less extensive and are less dense than on the previous study indicating some improvement but clearing has not occurred since 02/29/2012 study.  There is osteopenic appearance of the bones.  There is minimal degenerative spondylosis.  IMPRESSION: Borderline cardiac silhouette size.  Airspace disease and associated pleural density is again evident in the right upper lobe and upper right hemithorax.  The infiltrative densities are slightly less extensive and are less dense than on the previous study indicating some improvement but clearing has not occurred since 02/29/2012 study. Since clearing has not occurred, chronic pneumonia, atypical infection, and neoplastic process must be considered.  CT of the chest could be performed for additional evaluation.  Original Report Authenticated By: Crawford Givens, M.D.   Dg Esophagus  03/12/2012  *RADIOLOGY REPORT*  Clinical data:  History of Nissen fundoplication for hiatal hernia in 2004 while in [place]. [place].  The patient presents now with recurrent episodes of pneumonia, possibly secondary to aspiration.  ESOPHOGRAM / BARIUM SWALLOW  03/12/2012:  Technique:  Combined double contrast and single contrast examination performed using effervescent crystals, thick barium liquid, and thin barium liquid.  Comparison: None.  Findings:  Patient swallowed the thick and thin barium liquid without difficulty.  Moderate diffuse dilation of the esophagus with severe esophageal dysmotility, as evidenced by breakup of the primary wave in the proximal esophagus, numerous tertiary esophageal contractions, and to-and-fro esophageal motion.  This resulted in a large amount of the ingested liquid remaining in the esophagus throughout the examination.  Moderate stricture at the esophagogastric junction at the site of the Nissen wrap; because of the stricture, the 12.5 mm barium tablet was not given to the patient.   Gastroesophageal reflux was not elicited.  No esophageal masses.  Evaluation of the pharynx during rapid sequence imaging with swallows of thin barium liquid revealed intermittent slight prominence of the cricopharyngeus muscle.  No evidence of laryngeal penetration or tracheal aspiration.  Fluoroscopy time:  1 minute 41 seconds, pulsed fluoroscopy  IMPRESSION:  1.  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 2.  Diffusely dilated esophagus. 3.  Severe esophageal dysmotility.  As a result, a large amount of the ingested liquid remained in the esophagus throughout the examination, possibly placing the patient at risk for aspiration. 4.  Intermittent prominence of the cricopharyngeus muscle in the pharynx.  No evidence of laryngeal penetration or tracheal aspiration with thin barium liquid.  Preliminary results were discussed with the patient at the time of the examination.  Original Report Authenticated By: Arnell Sieving, M.D.     IMPRESSIONS AND PLAN: A 71 y.o. female with   History of node-negative ER/PR positive breast cancer on tamoxifen. She is doing better from respiratory point of view but is having some joint and bone pain. This is likely from underlying osteoarthritis. I don't suspect that the tamoxifen but role here. I mentioned to her that her vitamin D level is low and  perhaps to supplement this her record pain can also be improved. I will plan to see her in 6 months time. Unfortunately did not have her bone density test available to discuss with her today I will discuss with her over the phone. Once again we'll see her in 6 months her mammogram we'll begin summer of 2014. Spent more than half the time coordinating care, as well as discussion of BMI and its implications.      Etherine Mackowiak 10/29/20132:10 PM Cell [phone]

## 2012-09-09 ENCOUNTER — Encounter: Payer: Self-pay | Admitting: Adult Health

## 2012-09-09 ENCOUNTER — Ambulatory Visit (INDEPENDENT_AMBULATORY_CARE_PROVIDER_SITE_OTHER)
Admission: RE | Admit: 2012-09-09 | Discharge: 2012-09-09 | Disposition: A | Discharge status: Home / Self Care | Payer: Medicare Other | Source: Ambulatory Visit | Attending: Adult Health | Admitting: Adult Health

## 2012-09-09 ENCOUNTER — Ambulatory Visit (INDEPENDENT_AMBULATORY_CARE_PROVIDER_SITE_OTHER): Payer: Medicare Other | Admitting: Adult Health

## 2012-09-09 VITALS — BP 122/86 | HR 102 | Temp 97.6°F | Ht 61.0 in | Wt 187.0 lb

## 2012-09-09 DIAGNOSIS — J209 Acute bronchitis, unspecified: Secondary | ICD-10-CM

## 2012-09-09 MED ORDER — LEVALBUTEROL HCL 0.63 MG/3ML IN NEBU
0.6300 mg | INHALATION_SOLUTION | Freq: Once | RESPIRATORY_TRACT | Status: AC
  Administered 2012-09-09: 0.63 mg via RESPIRATORY_TRACT

## 2012-09-09 MED ORDER — LEVOFLOXACIN 500 MG PO TABS: 500.0000 mg | ORAL_TABLET | Freq: Every day | ORAL | Status: DC

## 2012-09-09 MED ORDER — FLUCONAZOLE 150 MG PO TABS: 150.0000 mg | ORAL_TABLET | Freq: Once | ORAL | Status: DC

## 2012-09-09 NOTE — Assessment & Plan Note (Signed)
Slow to resolve acute bronchitis w/ associated yeast infection (from suspected abx use)  Check xray today   Plan Begin Levaquin 500mg  daily for 7 days  Mucinex DM Twice daily  As needed  Cough/congestion  Fluids and rest  I will call with xray results.  Please contact office for sooner follow up if symptoms do not improve or worsen or seek emergency care  Follow up Dr. Vassie Loll  In 6  weeks  When you take antibiotics , eat yogurt and begin Trial of Align -Probiotic daily while on antibiotics  Diflucan 150mg  tab x 1 day.

## 2012-09-09 NOTE — Progress Notes (Signed)
Subjective:    Patient ID: Joan Weaver, female    DOB: [birth year], 71 y.o.   MRN: [id number]  HPI   Oncologist : Donnie Coffin, Dr Mitzi Hansen   70/F, never smoker for FU of atypical pneumonitis - favor sub clinical aspiration.  Diagnosed with breast CA in July '11, underwent lumpectomy ([facility]) followed by RT , last nov'11.  She presented with pneumonia since Feb '12 not responding to antibiotics.  Transbronchial biopsy 3/12  c/w with organizing pna -neg for BOOP, complicated by pneumothorax  On steroids on & off since 3/12 -off x 5 wks 4/13    Serial CT chest  3/12, 10/12 showed Multiple shifting areas of consolidation in both lungs. Areas  of consolidation seen on the prior chest CT have resolved and there are new, similar appearing areas of predominately peripheral consolidation. 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 Severe esophageal dysmotility. As a result, a large amount of the ingested liquid remained in the esophagus throughout the examination, possibly placing the patient at risk for aspiration. Intermittent prominence of the cricopharyngeus muscle in the pharynx. No evidence of laryngeal penetration or tracheal aspiration with thin barium liquid.  EGD - Tortuous in the distal esophagus without evidence for stricture. Empiric dilation done- Barrett's, possible at the gastroesophageal junction   06/07/12 2 m FU  breathing is little better--still can't walk very far w/o getting SOB, slight wheezing when she is SOB, occasional cough. down to 5mg  of prednisone a day CXR -  nodular area in left base appear chronic and unchanged. Noncalcific apical pleural thickening bilaterally appears unchanged. Streaky opacities within the right upper lobe  appear chronic.  >Tapered  off steroids   09/09/2012 Acute OV  Reports prod cough with green mucus, wheezing, chills x2 weeks, finished round of augmentin 1wk ago. Felt some better initially , but never went totally away. Cough and congestion returned with green mucus.  Cough is harsh and barking.      Review of Systems  Constitutional:   No  weight loss, night sweats,  Fevers, chills, +fatigue, or  lassitude.  HEENT:   No headaches,  Difficulty swallowing,  Tooth/dental problems, or  Sore throat,                No sneezing, itching, ear ache,  +nasal congestion, post nasal drip,   CV:  No chest pain,  Orthopnea, PND, swelling in lower extremities, anasarca, dizziness, palpitations, syncope.   GI  No heartburn, indigestion, abdominal pain, nausea, vomiting, diarrhea, change in bowel habits, loss of appetite, bloody stools.   Resp:    No coughing up of blood.    No chest wall deformity  Skin: no rash or lesions.  GU: no dysuria, change in color of urine, no urgency or frequency.  No flank pain, no hematuria   MS:  No joint pain or swelling.  No decreased range of motion.  No back pain.  Psych:  No change in mood or affect. No depression or anxiety.  No memory loss.         Objective:   Physical Exam  Gen. Pleasant,   in no distress ENT - no lesions, no post nasal drip Neck: No JVD, no thyromegaly, no carotid bruits Lungs: no use of accessory muscles, no dullness  to percussion, few rhonchi  Cardiovascular: Rhythm regular, heart sounds  normal, no murmurs or gallops, no peripheral edema Musculoskeletal: No deformities, no cyanosis or clubbing        Assessment & Plan:

## 2012-09-09 NOTE — Addendum Note (Signed)
Addended by: Boone Master E on: 09/09/2012 12:04 PM   Modules accepted: Orders

## 2012-09-09 NOTE — Addendum Note (Signed)
Addended by: Caryl Ada on: 09/09/2012 11:51 AM   Modules accepted: Orders

## 2012-09-09 NOTE — Patient Instructions (Addendum)
Begin Levaquin 500mg  daily for 7 days  Mucinex DM Twice daily  As needed  Cough/congestion  Fluids and rest  I will call with xray results.  Please contact office for sooner follow up if symptoms do not improve or worsen or seek emergency care  Follow up Dr. Vassie Loll  In 6  weeks  When you take antibiotics , eat yogurt and begin Trial of Align -Probiotic daily while on antibiotics  Diflucan 150mg  tab x 1 day.

## 2012-09-11 ENCOUNTER — Other Ambulatory Visit: Payer: Self-pay | Admitting: Adult Health

## 2012-09-11 DIAGNOSIS — J189 Pneumonia, unspecified organism: Secondary | ICD-10-CM

## 2012-09-11 NOTE — Progress Notes (Signed)
Quick Note:  Called spoke with patient, advised of cxr results / recs as stated by TP. Pt stated that she is improving since last ov. Ov w/ RA scheduled for 12.19.13 @ 2pm. Pt aware to arrive early for STAT cxr prior. Orders only encounter created for cxr order. Pt aware to call for sooner follow up if her symptoms do not improve or worsen. ______

## 2012-09-26 ENCOUNTER — Ambulatory Visit (INDEPENDENT_AMBULATORY_CARE_PROVIDER_SITE_OTHER): Payer: Medicare Other | Admitting: Pulmonary Disease

## 2012-09-26 ENCOUNTER — Encounter: Payer: Self-pay | Admitting: Pulmonary Disease

## 2012-09-26 ENCOUNTER — Ambulatory Visit (INDEPENDENT_AMBULATORY_CARE_PROVIDER_SITE_OTHER)
Admission: RE | Admit: 2012-09-26 | Discharge: 2012-09-26 | Disposition: A | Discharge status: Home / Self Care | Payer: Medicare Other | Source: Ambulatory Visit | Attending: Pulmonary Disease | Admitting: Pulmonary Disease

## 2012-09-26 VITALS — BP 118/88 | HR 89 | Temp 98.8°F | Ht 62.0 in | Wt 185.8 lb

## 2012-09-26 DIAGNOSIS — J189 Pneumonia, unspecified organism: Secondary | ICD-10-CM

## 2012-09-26 DIAGNOSIS — K224 Dyskinesia of esophagus: Secondary | ICD-10-CM

## 2012-09-26 MED ORDER — AMOXICILLIN-POT CLAVULANATE 875-125 MG PO TABS: 1.0000 | ORAL_TABLET | Freq: Two times a day (BID) | ORAL | Status: DC

## 2012-09-26 NOTE — Progress Notes (Signed)
  Subjective:    Patient ID: Joan Weaver, female    DOB: 1941/08/11, 71 y.o.   MRN: [id number]  HPI  Oncologist : Donnie Coffin, Dr Mitzi Hansen   [name], never smoker for FU of episodes of pneumonitis in different lung zones - favor aspiration due to esophageal dysmotility Diagnosed with breast CA in July '11, underwent lumpectomy ([facility]) followed by RT , last nov'11.  She presented with pneumonia since Feb '12 not responding to antibiotics.  Transbronchial biopsy 3/12 c/w with organizing pna -neg for BOOP, complicated by pneumothorax  On steroids on & off since 3/12 -off x 5 wks 4/13  Serial CT chest 3/12, 10/12 showed Multiple shifting areas of consolidation in both lungs. Areas  of consolidation seen on the prior chest CT have resolved and there are new, similar appearing areas of predominately peripheral consolidation. 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 Severe esophageal dysmotility. As a result, a large amount of the ingested liquid remained in the esophagus throughout the examination, possibly placing the patient at risk for aspiration. Intermittent prominence of the cricopharyngeus muscle in the pharynx. No evidence of laryngeal penetration or tracheal aspiration with thin barium liquid.  EGD - Tortuous in the distal esophagus without evidence for stricture. Empiric dilation done- Barrett's, possible at the gastroesophageal junction   Dec '12 RUL ASD 06/07/12  Lt basal nodular area >Tapered off steroids   09/26/2012  ON last visit Rx for augmentin was sent to her pharmacy -  She started this after developing  prod cough with green mucus, wheezing, chills but required  Acute OV on 12/2 >> given levaquin  Today she reports she feels mich better than at last visit, however  still fatigue and still having prod cough w white-yellow mucus States better but coughing a lot today Rpt CXR showed unchanged RLL consolidation   Review of Systems neg for any significant sore throat, dysphagia, itching, sneezing, nasal congestion or excess/ purulent secretions, fever, chills, sweats, unintended wt loss, pleuritic or exertional cp, hempoptysis, orthopnea pnd or change in chronic leg swelling. Also denies presyncope, palpitations, heartburn, abdominal pain, nausea, vomiting, diarrhea or change in bowel or urinary habits, dysuria,hematuria, rash, arthralgias, visual complaints, headache, numbness weakness or ataxia.     Objective:   Physical Exam  Gen. Pleasant, well-nourished, in no distress ENT - no lesions, no post nasal drip Neck: No JVD, no thyromegaly, no carotid bruits Lungs: no use of accessory muscles, no dullness to percussion, clear without rales or rhonchi  Cardiovascular: Rhythm regular, heart sounds  normal, no murmurs or gallops, no peripheral edema Musculoskeletal: No deformities, no cyanosis or clubbing        Assessment & Plan:

## 2012-09-26 NOTE — Patient Instructions (Addendum)
Continue on delsym round the clock & cough drops I wil discuss with dr Rhea Belton about referral to [facility] Refill on augmentin [phone] bid x 7ds Call us if you need a stronger cough syrup

## 2012-09-26 NOTE — Assessment & Plan Note (Addendum)
Continue on delsym round the clock & cough drops Refill on augmentin [phone] bid x 7ds for future episodes of pneumonitis Call us if you need a stronger cough syrup

## 2012-09-27 NOTE — Assessment & Plan Note (Signed)
Have discussed with GI -dr Rhea Belton - he will referr to [facility]

## 2012-10-04 ENCOUNTER — Telehealth: Payer: Self-pay | Admitting: Gastroenterology

## 2012-10-04 NOTE — Telephone Encounter (Signed)
Faxed records and imagina to Dr. Lavona Mound at [facility] [phone] for evaluation of esophageal dysmotility and recurrent aspiration pneumonia

## 2012-10-21 ENCOUNTER — Ambulatory Visit: Payer: Medicare Other | Admitting: Adult Health

## 2012-10-28 ENCOUNTER — Ambulatory Visit (INDEPENDENT_AMBULATORY_CARE_PROVIDER_SITE_OTHER)
Admission: RE | Admit: 2012-10-28 | Discharge: 2012-10-28 | Disposition: A | Discharge status: Home / Self Care | Payer: Medicare Other | Source: Ambulatory Visit | Attending: Adult Health | Admitting: Adult Health

## 2012-10-28 ENCOUNTER — Encounter: Payer: Self-pay | Admitting: Adult Health

## 2012-10-28 ENCOUNTER — Ambulatory Visit (INDEPENDENT_AMBULATORY_CARE_PROVIDER_SITE_OTHER): Payer: Medicare Other | Admitting: Adult Health

## 2012-10-28 VITALS — BP 120/74 | HR 92 | Temp 98.8°F | Ht 63.5 in | Wt 186.6 lb

## 2012-10-28 DIAGNOSIS — K224 Dyskinesia of esophagus: Secondary | ICD-10-CM

## 2012-10-28 DIAGNOSIS — J209 Acute bronchitis, unspecified: Secondary | ICD-10-CM

## 2012-10-28 DIAGNOSIS — J189 Pneumonia, unspecified organism: Secondary | ICD-10-CM

## 2012-10-28 NOTE — Patient Instructions (Addendum)
Continue on current regimen  follow up Dr. Vassie Loll  In 6 weeks  GERD diet and precautions.

## 2012-10-28 NOTE — Progress Notes (Signed)
  Subjective:    Patient ID: Joan Weaver, female    DOB: 1941/07/28, 72 y.o.   MRN: [id number] HPI  Oncologist : Donnie Coffin, Dr Mitzi Hansen   [name], never smoker for FU of episodes of pneumonitis in different lung zones - favor aspiration due to esophageal dysmotility Diagnosed with breast CA in July '11, underwent lumpectomy ([facility]) followed by RT , last nov'11.  She presented with pneumonia since Feb '12 not responding to antibiotics.  Transbronchial biopsy 3/12 c/w with organizing pna -neg for BOOP, complicated by pneumothorax  On steroids on & off since 3/12 -off x 5 wks 4/13  Serial CT chest 3/12, 10/12 showed Multiple shifting areas of consolidation in both lungs. Areas  of consolidation seen on the prior chest CT have resolved and there are new, similar appearing areas of predominately peripheral consolidation. 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 Severe esophageal dysmotility. As a result, a large amount of the ingested liquid remained in the esophagus throughout the examination, possibly placing the patient at risk for aspiration. Intermittent prominence of the cricopharyngeus muscle in the pharynx. No evidence of laryngeal penetration or tracheal aspiration with thin barium liquid.  EGD - Tortuous in the distal esophagus without evidence for stricture. Empiric dilation done- Barrett's, possible at the gastroesophageal junction   Dec '12 RUL ASD 06/07/12  Lt basal nodular area >Tapered off steroids   09/26/12   ON last visit Rx for augmentin was sent to her pharmacy -  She started this after developing  prod cough with green mucus, wheezing, chills but required  Acute OV on 12/2 >> given levaquin  Today she reports she feels mich better than at last visit, however still  fatigue and still having prod cough w white-yellow mucus States better but coughing a lot today Rpt CXR showed unchanged RLL consolidation >Augmentin to have on hold   10/28/2012 Follow up  1 month follow up pneumonia - still weak, no energy, increased DOE, prod cough is now clear. No flare in cough or discolored mucus.  No fever or  Hemoptysis.  Is awaiting call back from [facility] referral for esophageal dysmotility.  No n/v, or choking espiosdes since last seen.      Review of Systems  neg for any significant sore throat, dysphagia, itching, sneezing, nasal congestion or excess/ purulent secretions, fever, chills, sweats, unintended wt loss, pleuritic or exertional cp, hempoptysis, orthopnea pnd or change in chronic leg swelling. Also denies presyncope, palpitations, heartburn, abdominal pain, nausea, vomiting, diarrhea or change in bowel or urinary habits, dysuria,hematuria, rash, arthralgias, visual complaints, headache, numbness weakness or ataxia.     Objective:   Physical Exam   Gen. Pleasant, well-nourished, in no distress ENT - no lesions, no post nasal drip Neck: No JVD, no thyromegaly, no carotid bruits Lungs: no use of accessory muscles, no dullness to percussion, clear without rales or rhonchi  Cardiovascular: Rhythm regular, heart sounds  normal, no murmurs or gallops, no peripheral edema Musculoskeletal: No deformities, no cyanosis or clubbing        Assessment & Plan:

## 2012-10-29 ENCOUNTER — Encounter: Payer: Self-pay | Admitting: Adult Health

## 2012-10-29 NOTE — Progress Notes (Signed)
2m FU with CXR ok

## 2012-10-30 ENCOUNTER — Telehealth: Payer: Self-pay | Admitting: Oncology

## 2012-10-30 ENCOUNTER — Other Ambulatory Visit: Payer: Self-pay | Admitting: *Deleted

## 2012-10-30 DIAGNOSIS — C50919 Malignant neoplasm of unspecified site of unspecified female breast: Secondary | ICD-10-CM

## 2012-10-30 DIAGNOSIS — E559 Vitamin D deficiency, unspecified: Secondary | ICD-10-CM

## 2012-10-30 MED ORDER — TAMOXIFEN CITRATE 20 MG PO TABS: 20.0000 mg | ORAL_TABLET | Freq: Every day | ORAL | Status: DC

## 2012-10-30 NOTE — Telephone Encounter (Signed)
Pt called asking if she has been reassigned.   She would like see Dr. Welton Flakes.  She has three tamoxifen left.  She uses [facility] on [address]  [phone].   She needs a refill.    She is not scheduled until May, 2014.   I sent an email to [employer].

## 2012-10-31 ENCOUNTER — Telehealth: Payer: Self-pay | Admitting: Gastroenterology

## 2012-10-31 NOTE — Telephone Encounter (Signed)
Left vm for pt to call me back regarding her referral to [facility].  They have made several attempts to get in contact with her to schedule her appointment but have not heard back from her.

## 2012-11-01 NOTE — Assessment & Plan Note (Signed)
Cont w/ GI follow up  GERD diet  Awaiting referral to [facility]

## 2012-11-01 NOTE — Assessment & Plan Note (Signed)
CXR pending today  No acute symptoms at present  follow up Dr. Vassie Loll  In 6 weeks

## 2012-11-04 NOTE — Progress Notes (Signed)
[facility] today.  Letter mailed to patient.  

## 2012-11-05 ENCOUNTER — Telehealth: Payer: Self-pay | Admitting: Gastroenterology

## 2012-11-05 NOTE — Telephone Encounter (Signed)
lvm for pt to call me back regarding [facility] referral.

## 2012-12-11 ENCOUNTER — Telehealth: Payer: Self-pay | Admitting: *Deleted

## 2012-12-11 NOTE — Telephone Encounter (Signed)
Left message for a return phone call to reschedule her appt.  Awaiting patient response I have cancelled her appts.  

## 2012-12-16 ENCOUNTER — Encounter: Payer: Self-pay | Admitting: Pulmonary Disease

## 2012-12-16 ENCOUNTER — Ambulatory Visit (INDEPENDENT_AMBULATORY_CARE_PROVIDER_SITE_OTHER)
Admission: RE | Admit: 2012-12-16 | Discharge: 2012-12-16 | Disposition: A | Discharge status: Home / Self Care | Payer: Medicare Other | Source: Ambulatory Visit | Attending: Pulmonary Disease | Admitting: Pulmonary Disease

## 2012-12-16 ENCOUNTER — Ambulatory Visit (INDEPENDENT_AMBULATORY_CARE_PROVIDER_SITE_OTHER): Payer: Medicare Other | Admitting: Pulmonary Disease

## 2012-12-16 VITALS — BP 130/80 | HR 95 | Temp 97.4°F | Ht 61.0 in | Wt 189.6 lb

## 2012-12-16 DIAGNOSIS — J189 Pneumonia, unspecified organism: Secondary | ICD-10-CM

## 2012-12-16 NOTE — Assessment & Plan Note (Signed)
Favor aspiration episodes over BOOP &   radiation pneumonitis CT chest 07/07/11 Multiple shifting areas of consolidation in both lungs. Areas of consolidation seen on the prior chest CT have resolved and there are new, similar appearing areas of predominately peripheral consolidation .  CXR 06/07/12  -unchanged rt UL  & lt base infiltrates 12/13 new RLL infx 10/28/12 A stable nodule is noted in the lingula on the left.  CXR today Tylenol OK for arthritis Use augmentin only for congestion, fever x 2 days, colored sputum

## 2012-12-16 NOTE — Progress Notes (Signed)
  Subjective:    Patient ID: Joan Weaver, female    DOB: 1941/07/11, 72 y.o.   MRN: [id number]  HPI Oncologist : Donnie Coffin, Dr Mitzi Hansen   [name], never smoker for FU of episodes of pneumonitis in different lung zones - favor aspiration due to esophageal dysmotility Diagnosed with breast CA in July '11, underwent lumpectomy ([facility]) followed by RT , last nov'11.  She presented with pneumonia since Feb '12 not responding to antibiotics.  Transbronchial biopsy 3/12 c/w with organizing pna -neg for BOOP, complicated by pneumothorax  On steroids on & off since 3/12 -off x 5 wks 4/13  Serial CT chest 3/12, 10/12 showed Multiple shifting areas of consolidation in both lungs. Areas  of consolidation seen on the prior chest CT have resolved and there are new, similar appearing areas of predominately peripheral consolidation. 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 Severe esophageal dysmotility. As a result, a large amount of the ingested liquid remained in the esophagus throughout the examination, possibly placing the patient at risk for aspiration. Intermittent prominence of the cricopharyngeus muscle in the pharynx. No evidence of laryngeal penetration or tracheal aspiration with thin barium liquid.  EGD - Tortuous in the distal esophagus without evidence for stricture. Empiric dilation done- Barrett's, possible at the gastroesophageal junction   Dec '12 RUL ASD 06/07/12  Lt basal nodular area >Tapered off steroids   09/26/12  >Augmentin to have on hold        12/16/2012 85m FU still weak, no energy,no  increased DOE, no prod cough . No flare in cough or discolored mucus.  No fever or  Hemoptysis.  [facility]Appt from [facility] for 6/14 for esophageal dysmotility.  No n/v, or choking  espiosdes since last seen.   breathing is slight better but sitll gets SOB w/ exertion, occasional cough, occasional wheezing no chest tx.  CXR - RLL consolidation resolved, LLL persistent   Review of Systems  neg for any significant sore throat, dysphagia, itching, sneezing, nasal congestion or excess/ purulent secretions, fever, chills, sweats, unintended wt loss, pleuritic or exertional cp, hempoptysis, orthopnea pnd or change in chronic leg swelling. Also denies presyncope, palpitations, heartburn, abdominal pain, nausea, vomiting, diarrhea or change in bowel or urinary habits, dysuria,hematuria, rash, arthralgias, visual complaints, headache, numbness weakness or ataxia.     Objective:   Physical Exam  Gen. Pleasant, well-nourished, in no distress ENT - no lesions, no post nasal drip Neck: No JVD, no thyromegaly, no carotid bruits Lungs: no use of accessory muscles, no dullness to percussion, clear without rales or rhonchi  Cardiovascular: Rhythm regular, heart sounds  normal, no murmurs or gallops, no peripheral edema Musculoskeletal: No deformities, no cyanosis or clubbing         Assessment & Plan:

## 2012-12-16 NOTE — Patient Instructions (Addendum)
CXR today Tylenol OK for arthritis Use augmentin only for congestion, fever x 2 days, colored sputum

## 2012-12-18 IMAGING — CR DG CHEST 2V
2 series · 2 of 2 positions shown · non-contrast
Comparison: 01/15/2012

CLINICAL DATA: Pneumonia, cough, shortness of breath.

CHEST - 2 VIEW

[view not recorded (1 of 2)]
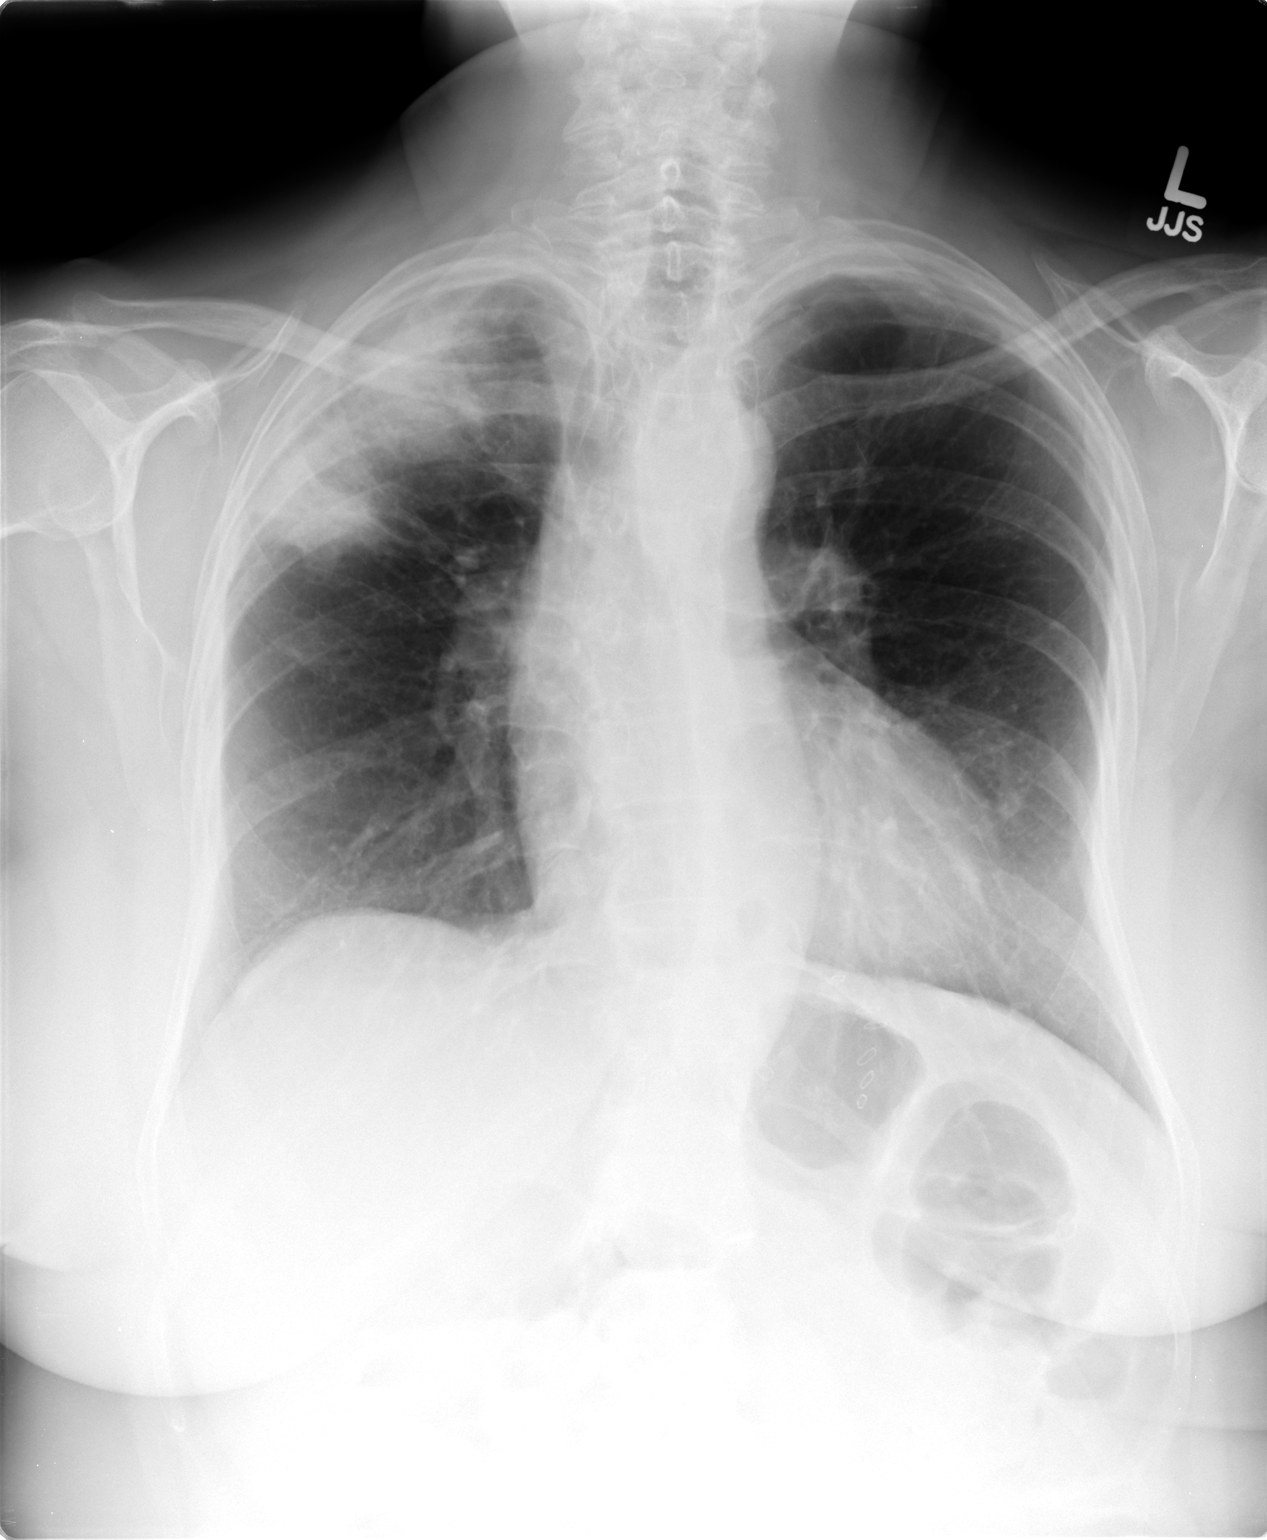

[view not recorded (2 of 2)]
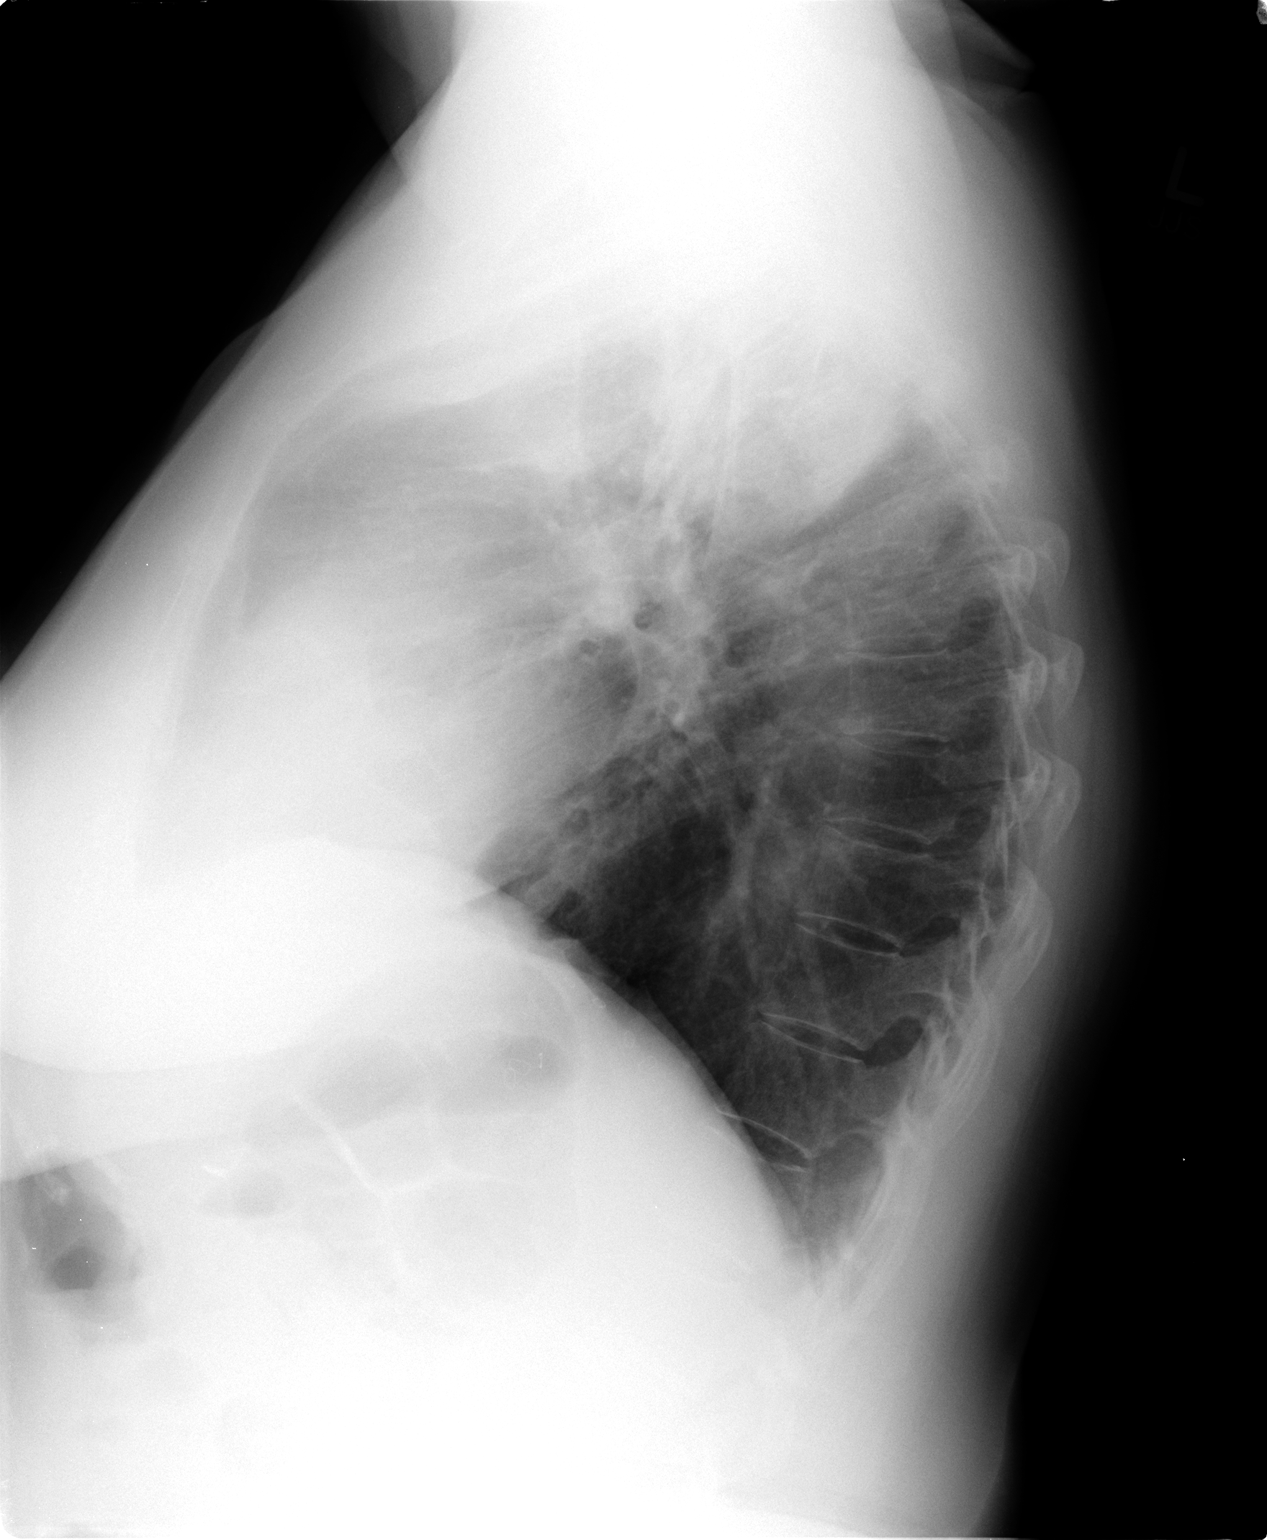

[2 of 2 positions shown; findings below may reference images not displayed]

FINDINGS: New consolidation in the right upper lobe/apex.  The
previously seen opacities in the right inferior upper lobe have has
improved.  Nodular density along the left heart border also
improved.  No other areas of consolidation.  Heart is normal size.
No effusions or acute bony abnormality.
IMPRESSION: New area of consolidation involving much of the right upper
lobe/apex.  Findings concerning for pneumonia.

## 2012-12-26 ENCOUNTER — Telehealth: Payer: Self-pay | Admitting: Family Medicine

## 2012-12-26 DIAGNOSIS — M1990 Unspecified osteoarthritis, unspecified site: Secondary | ICD-10-CM

## 2012-12-26 MED ORDER — CELECOXIB 200 MG PO CAPS: 200.0000 mg | ORAL_CAPSULE | Freq: Every day | ORAL | Status: DC

## 2012-12-26 NOTE — Telephone Encounter (Signed)
Medication refilled per protocol. 

## 2012-12-28 ENCOUNTER — Encounter: Payer: Self-pay | Admitting: *Deleted

## 2012-12-28 NOTE — Progress Notes (Signed)
Message left, letter mailed, awaiting pts response.

## 2013-01-22 ENCOUNTER — Encounter: Payer: Self-pay | Admitting: Oncology

## 2013-01-22 ENCOUNTER — Telehealth: Payer: Self-pay | Admitting: *Deleted

## 2013-01-22 NOTE — Telephone Encounter (Signed)
Pt received my letter & I confirmed 02/14/13 appt w/ pt.  Mailed letter & calendar to pt.

## 2013-02-04 ENCOUNTER — Other Ambulatory Visit: Payer: Medicare Other | Admitting: Lab

## 2013-02-11 ENCOUNTER — Ambulatory Visit: Payer: Medicare Other | Admitting: Oncology

## 2013-02-14 ENCOUNTER — Telehealth: Payer: Self-pay | Admitting: Oncology

## 2013-02-14 ENCOUNTER — Ambulatory Visit (HOSPITAL_BASED_OUTPATIENT_CLINIC_OR_DEPARTMENT_OTHER): Payer: Medicare Other | Admitting: Oncology

## 2013-02-14 ENCOUNTER — Encounter: Payer: Self-pay | Admitting: Oncology

## 2013-02-14 VITALS — BP 144/92 | HR 106 | Temp 99.0°F | Resp 20 | Ht 61.0 in | Wt 189.1 lb

## 2013-02-14 DIAGNOSIS — C50419 Malignant neoplasm of upper-outer quadrant of unspecified female breast: Secondary | ICD-10-CM

## 2013-02-14 DIAGNOSIS — G8929 Other chronic pain: Secondary | ICD-10-CM

## 2013-02-14 DIAGNOSIS — M899 Disorder of bone, unspecified: Secondary | ICD-10-CM

## 2013-02-14 DIAGNOSIS — E559 Vitamin D deficiency, unspecified: Secondary | ICD-10-CM

## 2013-02-14 DIAGNOSIS — C50912 Malignant neoplasm of unspecified site of left female breast: Secondary | ICD-10-CM

## 2013-02-14 DIAGNOSIS — M1990 Unspecified osteoarthritis, unspecified site: Secondary | ICD-10-CM

## 2013-02-14 DIAGNOSIS — C50919 Malignant neoplasm of unspecified site of unspecified female breast: Secondary | ICD-10-CM

## 2013-02-14 DIAGNOSIS — M949 Disorder of cartilage, unspecified: Secondary | ICD-10-CM

## 2013-02-14 MED ORDER — TAMOXIFEN CITRATE 20 MG PO TABS: 20.0000 mg | ORAL_TABLET | Freq: Every day | ORAL | Status: DC

## 2013-02-14 NOTE — Patient Instructions (Addendum)
Continue tamoxifen 20 mg daily  I will see you back in 6 months  We discussed you having a gynecologist to continue yearly exam

## 2013-02-18 NOTE — Progress Notes (Signed)
OFFICE PROGRESS NOTE  CC  [name], MD [address] [place] [address] Dr. Dorothy Puffer Dr. 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  STAGE: Left breast at the 9:00 position T1 N0 (stage I) ER +98% PR +97% HER-2/neu negative, Ki-67 18% Invasive ductal carcinoma  PRIOR THERAPY:  #1 patient presented at the age of 75 for a screening mammogram in 2011 that showed a suspicious mass in the left 9:00 position. Biopsy showed invasive ductal carcinoma HER-2/neu negative ER positive PR positive respectively at 98 and 97% proliferation marker Ki-67 18%. She had a MRI that showed a solitary enhancing mass in the medial aspect of the left breast measuring 8 x 6 x 10 mm.  #2 05/11/2010 patient underwent a lumpectomy that showed invasive ductal carcinoma measuring 6.0 mm, grade 2, no angiolymphatic invasion. All resection margins were negative. 2 sentinel nodes were negative for metastatic disease. Tumor was strongly ER +98% PR +97%.  #3 patient subsequently received radiation therapy by Dr. Dorothy Puffer which she completed in August 2011.  #4 she was then begun on tamoxifen 20 mg daily starting in September 2011. Patient also had osteopenia so she was also placed on Zometa.  CURRENT THERAPY: Tamoxifen 20 mg daily  INTERVAL HISTORY: Joan Weaver 72 y.o. female returns for followup visit. Overall she's doing well she is still having some aches and pains in her lower extremities from arthritis. This is been ongoing. She denies any fevers chills night sweats headaches shortness of breath she's had some history of recurrent pneumonias. She does have some myalgias as well as arthralgias as noted. She has occasional hot flashes. She has no vaginal discharge no bleeding no lower extremity swelling. Remainder of the 10 point review of systems is negative.  MEDICAL HISTORY: Past Medical  History  Diagnosis Date  . Breast cancer August 2011  . Thyroid disease   . Hypertension   . Osteoporosis   . Arthritis   . Hernia   . Cataract   . Ulcer     ALLERGIES:  is allergic to advair diskus; aspirin; and demerol.  MEDICATIONS:  Current Outpatient Prescriptions  Medication Sig Dispense Refill  . acetaminophen (TYLENOL) 500 MG tablet Take 500 mg by mouth every 6 (six) hours as needed.        Marland Kitchen BIOTIN FORTE PO Take 1 tablet by mouth daily.      . Calcium Carbonate-Vitamin D (CALCIUM 600 + D PO) Take 1 tablet by mouth 2 (two) times daily.       . celecoxib (CELEBREX) 200 MG capsule Take 1 capsule (200 mg total) by mouth daily. Once a day  30 capsule  5  . Cholecalciferol (VITAMIN D) 1000 UNITS capsule Take 1,000 Units by mouth 2 (two) times daily.       . Cranberry 500 MG CAPS Take by mouth. 2 tabs daily       . dextromethorphan (DELSYM) 30 MG/5ML liquid Take 60 mg by mouth as needed.        Marland Kitchen esomeprazole (NEXIUM) 40 MG capsule Take 1 capsule (40 mg total) by mouth daily as needed.  30 capsule  3  . fish oil-omega-3 fatty acids 1000 MG capsule Take 4 g by mouth daily.      Marland Kitchen glucosamine-chondroitin 500-400 MG tablet Take by mouth daily.       Marland Kitchen levothyroxine (SYNTHROID, LEVOTHROID) 25 MCG tablet Take 25 mcg  by mouth daily.        Marland Kitchen loratadine (CLARITIN) 10 MG tablet Take 10 mg by mouth daily.        . Magnesium 100 MG CAPS Take 1 capsule by mouth 2 (two) times daily.       . meclizine (ANTIVERT) 25 MG tablet As needed      . Multiple Vitamin (MULTIVITAMIN) capsule Take 1 capsule by mouth daily.        Marland Kitchen pyridOXINE (VITAMIN B-6) 100 MG tablet Take 100 mg by mouth daily.        . ranitidine (ZANTAC) 150 MG tablet Take 150 mg by mouth 2 (two) times daily.       . tamoxifen (NOLVADEX) 20 MG tablet Take 1 tablet (20 mg total) by mouth daily.  90 tablet  12  . vitamin E 400 UNIT capsule Take 400 Units by mouth daily.        Marland Kitchen amoxicillin-clavulanate (AUGMENTIN) 875-125 MG per  tablet Take 1 tablet by mouth 2 (two) times daily.  14 tablet  1   No current facility-administered medications for this visit.    SURGICAL HISTORY:  Past Surgical History  Procedure Laterality Date  . Hiatal hernia repair      x 2  . Tonsillectomy    . Appendectomy    . Left cataract surgery    . Breast lumpectomy      left    REVIEW OF SYSTEMS:  Pertinent items are noted in HPI.   HEALTH MAINTENANCE:   PHYSICAL EXAMINATION: Blood pressure 144/92, pulse 106, temperature 99 F (37.2 C), temperature source Oral, resp. rate 20, height 5\' 1"  (1.549 m), weight 189 lb 1.6 oz (85.775 kg). Body mass index is 35.75 kg/(m^2). ECOG PERFORMANCE STATUS: 0 - Asymptomatic   Well-developed nourished female in no acute distress HEENT exam EOMI PERRLA oral mucosa is moist neck is supple lungs clear to auscultation cardiovascular regular rate rhythm abdomen soft nontender extremities trace edema neuro is nonfocal breast exam left breast reveals well-healed surgical scar no nodularity no erythema no evidence of local recurrence right breast no masses or nipple discharge.   LABORATORY DATA: Lab Results  Component Value Date   WBC 4.1 07/30/2012   HGB 13.9 07/30/2012   HCT 41.0 07/30/2012   MCV 90.8 07/30/2012   PLT 180 07/30/2012      Chemistry      Component Value Date/Time   NA 141 07/30/2012 1318   NA 142 03/27/2012 1021   K 4.3 07/30/2012 1318   K 3.8 03/27/2012 1021   CL 108* 07/30/2012 1318   CL 109 03/27/2012 1021   CO2 24 07/30/2012 1318   CO2 22 03/27/2012 1021   BUN 16.0 07/30/2012 1318   BUN 17 03/27/2012 1021   CREATININE 0.9 07/30/2012 1318   CREATININE 0.92 03/27/2012 1021      Component Value Date/Time   CALCIUM 9.3 07/30/2012 1318   CALCIUM 8.6 03/27/2012 1021   ALKPHOS 53 07/30/2012 1318   ALKPHOS 43 03/27/2012 1021   AST 20 07/30/2012 1318   AST 15 03/27/2012 1021   ALT 13 07/30/2012 1318   ALT 14 03/27/2012 1021   BILITOT 0.40 07/30/2012 1318   BILITOT 0.4  03/27/2012 1021       RADIOGRAPHIC STUDIES:  No results found.  ASSESSMENT: 72 year old female with  #1 stage I (T1 in 0) invasive ductal carcinoma she is status post lumpectomy followed by radiation now on tamoxifen since September 2011. Overall she seems  to be tolerating it well. She understands that she will receive 5-10 years of therapy of tamoxifen.  #2 osteoarthritis and chronic pain. She has been on Celebrex she occasionally does take redness on as well.  #3 osteopenia we discussed getting a bone density scans. We also discussed the possibility of starting her on PROLIA but we will have to get this preauthorized.  #4 we discussed oncologic examinations on a yearly basis as well as I exams.   PLAN:   #1 continue tamoxifen 20 mg daily.  #2 I will see her back in 6 months time for followup.   All questions were answered. The patient knows to call the clinic with any problems, questions or concerns. We can certainly see the patient much sooner if necessary.  I spent 25 minutes counseling the patient face to face. The total time spent in the appointment was 30 minutes.    Drue Second, MD Medical/Oncology [facility] [phone] (beeper) [phone] (Office)

## 2013-03-11 ENCOUNTER — Telehealth: Payer: Self-pay | Admitting: Pulmonary Disease

## 2013-03-11 NOTE — Telephone Encounter (Signed)
I spoke with pt and she is requesting to have ALL her medical records sent to [facility]. She states Dr. Rhea Belton is referring her. I advised her she needs to call downstairs to medical records to have this done. I gave her the phone #. Nothing further was needed

## 2013-05-13 ENCOUNTER — Ambulatory Visit (INDEPENDENT_AMBULATORY_CARE_PROVIDER_SITE_OTHER): Payer: Medicare Other | Admitting: Family Medicine

## 2013-05-13 ENCOUNTER — Encounter: Payer: Self-pay | Admitting: Family Medicine

## 2013-05-13 VITALS — BP 119/70 | HR 84 | Temp 98.2°F | Resp 18 | Wt 186.0 lb

## 2013-05-13 DIAGNOSIS — N390 Urinary tract infection, site not specified: Secondary | ICD-10-CM

## 2013-05-13 DIAGNOSIS — R350 Frequency of micturition: Secondary | ICD-10-CM

## 2013-05-13 LAB — URINALYSIS, ROUTINE W REFLEX MICROSCOPIC
Glucose, UA: NEGATIVE mg/dL
Specific Gravity, Urine: 1.025 (ref 1.005–1.030)
Urobilinogen, UA: 0.2 mg/dL (ref 0.0–1.0)
pH: 5.5 (ref 5.0–8.0)

## 2013-05-13 LAB — URINALYSIS, MICROSCOPIC ONLY: Crystals: NONE SEEN

## 2013-05-13 MED ORDER — ETODOLAC 500 MG PO TABS: 500.0000 mg | ORAL_TABLET | Freq: Two times a day (BID) | ORAL | Status: DC

## 2013-05-13 MED ORDER — CIPROFLOXACIN HCL 500 MG PO TABS: 500.0000 mg | ORAL_TABLET | Freq: Two times a day (BID) | ORAL | Status: DC

## 2013-05-13 MED ORDER — LEVOTHYROXINE SODIUM 25 MCG PO TABS: 25.0000 ug | ORAL_TABLET | Freq: Every day | ORAL | Status: DC

## 2013-05-13 NOTE — Progress Notes (Signed)
Subjective:    Patient ID: Joan Weaver, female    DOB: 08-13-1941, 72 y.o.   MRN: [id number]  HPI Patient presents with one week of polyuria, urinary retention, hesitancy, dysuria, and pelvic pressure. She denies fever, chills, abdominal pain, nausea, vomiting. She denies vaginal discharge or vaginal bleeding. She denies hematuria. She denies back pain. Past Medical History  Diagnosis Date  . Breast cancer August 2011  . Thyroid disease   . Hypertension   . Osteoporosis   . Arthritis   . Hernia   . Cataract   . Ulcer    Current Outpatient Prescriptions on File Prior to Visit  Medication Sig Dispense Refill  . acetaminophen (TYLENOL) 500 MG tablet Take 500 mg by mouth every 6 (six) hours as needed.        Marland Kitchen BIOTIN FORTE PO Take 1 tablet by mouth daily.      . Calcium Carbonate-Vitamin D (CALCIUM 600 + D PO) Take 1 tablet by mouth 2 (two) times daily.       . celecoxib (CELEBREX) 200 MG capsule Take 1 capsule (200 mg total) by mouth daily. Once a day  30 capsule  5  . Cholecalciferol (VITAMIN D) 1000 UNITS capsule Take 1,000 Units by mouth 2 (two) times daily.       . Cranberry 500 MG CAPS Take by mouth. 2 tabs daily       . esomeprazole (NEXIUM) 40 MG capsule Take 1 capsule (40 mg total) by mouth daily as needed.  30 capsule  3  . fish oil-omega-3 fatty acids 1000 MG capsule Take 4 g by mouth daily.      Marland Kitchen glucosamine-chondroitin 500-400 MG tablet Take by mouth daily.       Marland Kitchen loratadine (CLARITIN) 10 MG tablet Take 10 mg by mouth daily.        . Magnesium 100 MG CAPS Take 1 capsule by mouth 2 (two) times daily.       . meclizine (ANTIVERT) 25 MG tablet As needed      . Multiple Vitamin (MULTIVITAMIN) capsule Take 1 capsule by mouth daily.        Marland Kitchen pyridOXINE (VITAMIN B-6) 100 MG tablet Take 100 mg by mouth daily.        . ranitidine (ZANTAC) 150 MG tablet Take 150 mg by mouth 2 (two) times daily.       . tamoxifen (NOLVADEX) 20 MG tablet Take 1 tablet (20 mg total) by mouth daily.   90 tablet  12  . vitamin E 400 UNIT capsule Take 400 Units by mouth daily.         No current facility-administered medications on file prior to visit.   Allergies  Allergen Reactions  . Advair Diskus (Fluticasone-Salmeterol)     Nervous and shakey  . Aspirin     If she takes too much, it burns her stomach  . Demerol Nausea Only   History   Social History  . Marital Status: Widowed    Spouse Name: N/A    Number of Children: 0  . Years of Education: N/A   Occupational History  . Retired      IT sales professional   Social History Main Topics  . Smoking status: Never Smoker   . Smokeless tobacco: Never Used  . Alcohol Use: No  . Drug Use: No  . Sexually Active: Not Currently   Other Topics Concern  . Not on file   Social History Narrative  . No narrative on file  Review of Systems  All other systems reviewed and are negative.       Objective:   Physical Exam  Vitals reviewed. Constitutional: She appears well-developed and well-nourished.  Cardiovascular: Normal rate, regular rhythm and normal heart sounds.   No murmur heard. Pulmonary/Chest: Effort normal and breath sounds normal. No respiratory distress. She has no wheezes. She has no rales.  Abdominal: Soft. Bowel sounds are normal. She exhibits no distension and no mass. There is no tenderness. There is no rebound and no guarding.          Assessment & Plan:  1. Frequent urination - Urinalysis, Routine w reflex microscopic - Urine culture  2. UTI (urinary tract infection) Urinalysis is listed below: Office Visit on 05/13/2013  Component Date Value Range Status  . Color, Urine 05/13/2013 YELLOW  YELLOW Final  . APPearance 05/13/2013 CLEAR  CLEAR Final  . Specific Gravity, Urine 05/13/2013 1.025  1.005 - 1.030 Final  . pH 05/13/2013 5.5  5.0 - 8.0 Final  . Glucose, UA 05/13/2013 NEG  NEG mg/dL Final  . Bilirubin Urine 05/13/2013 SMALL* NEG Final  . Ketones, ur 05/13/2013 TRACE* NEG mg/dL Final  .  Hgb urine dipstick 05/13/2013 NEG  NEG Final  . Protein, ur 05/13/2013 30* NEG mg/dL Final  . Urobilinogen, UA 05/13/2013 0.2  0.0 - 1.0 mg/dL Final  . Nitrite 16/07/9603 POS* NEG Final  . Leukocytes, UA 05/13/2013 NEG  NEG Final  . Squamous Epithelial / LPF 05/13/2013 RARE  RARE Final  . Crystals 05/13/2013 NONE SEEN  NONE SEEN Final  . Casts 05/13/2013 Hyaline casts noted  NONE SEEN Final  . WBC, UA 05/13/2013 0-2  <3 WBC/hpf Final  . RBC / HPF 05/13/2013 0-2  <3 RBC/hpf Final  . Bacteria, UA 05/13/2013 MANY* RARE Final   QNS TO SPIN   Urinalysis shows nitrites and bacteria. I will treat as a urinary tract infection with Cipro 500 mg by mouth twice a day for 5 days. I will send a urine culture.  Patient asked to discontinue Celebrex in favor of lodine due to cost

## 2013-05-14 ENCOUNTER — Encounter: Payer: Self-pay | Admitting: Family Medicine

## 2013-05-20 ENCOUNTER — Telehealth: Payer: Self-pay | Admitting: Family Medicine

## 2013-05-20 MED ORDER — SULFAMETHOXAZOLE-TMP DS 800-160 MG PO TABS: 1.0000 | ORAL_TABLET | Freq: Two times a day (BID) | ORAL | Status: DC

## 2013-05-20 NOTE — Telephone Encounter (Signed)
Med sent to pharm and pt aware and will call if not better after antibiotic

## 2013-05-20 NOTE — Telephone Encounter (Signed)
Lab did not send culture by accident.  Try bactrim ds pobid for 7 days.  Repeat UA if no better

## 2013-05-26 ENCOUNTER — Ambulatory Visit (INDEPENDENT_AMBULATORY_CARE_PROVIDER_SITE_OTHER): Payer: Medicare Other | Admitting: Gynecology

## 2013-05-26 ENCOUNTER — Other Ambulatory Visit (HOSPITAL_COMMUNITY)
Admission: RE | Admit: 2013-05-26 | Discharge: 2013-05-26 | Disposition: A | Discharge status: Home / Self Care | Payer: Medicare Other | Source: Ambulatory Visit | Attending: Gynecology | Admitting: Gynecology

## 2013-05-26 ENCOUNTER — Encounter: Payer: Self-pay | Admitting: Gynecology

## 2013-05-26 VITALS — BP 124/76 | Ht 61.0 in | Wt 180.0 lb

## 2013-05-26 DIAGNOSIS — N952 Postmenopausal atrophic vaginitis: Secondary | ICD-10-CM

## 2013-05-26 DIAGNOSIS — M810 Age-related osteoporosis without current pathological fracture: Secondary | ICD-10-CM

## 2013-05-26 DIAGNOSIS — C50912 Malignant neoplasm of unspecified site of left female breast: Secondary | ICD-10-CM

## 2013-05-26 DIAGNOSIS — N816 Rectocele: Secondary | ICD-10-CM

## 2013-05-26 DIAGNOSIS — C50919 Malignant neoplasm of unspecified site of unspecified female breast: Secondary | ICD-10-CM

## 2013-05-26 DIAGNOSIS — Z124 Encounter for screening for malignant neoplasm of cervix: Secondary | ICD-10-CM

## 2013-05-26 DIAGNOSIS — N950 Postmenopausal bleeding: Secondary | ICD-10-CM

## 2013-05-26 NOTE — Addendum Note (Signed)
Addended by: Dayna Barker on: 05/26/2013 11:32 AM   Modules accepted: Orders

## 2013-05-26 NOTE — Patient Instructions (Signed)
Follow up for ultrasound as scheduled 

## 2013-05-26 NOTE — Progress Notes (Signed)
Joan Weaver 1941/01/13 [id number]        72 y.o.  [name] new patient with several issues noted below.  Past medical history,surgical history, medications, allergies, family history and social history were all reviewed and documented in the EPIC chart.  ROS:  Performed and pertinent positives and negatives are included in the history, assessment and plan .  Exam: Kim assistant Filed Vitals:   05/26/13 1041  BP: 124/76  Height: 5\' 1"  (1.549 m)  Weight: 180 lb (81.647 kg)   General appearance  Normal Skin grossly normal Head/Neck normal with no cervical or supraclavicular adenopathy thyroid normal Lungs  clear Cardiac RR, without RMG Abdominal  soft, nontender, without masses, organomegaly or hernia Breasts  examined lying and sitting without masses, retractions, discharge or axillary adenopathy. Well-healed left lumpectomy scar Pelvic  Ext/BUS/vagina  With atrophic changes. First to second degree rectocele noted.  Cervix  normal with atrophic changes  Uterus  axial to anteverted, normal size, shape and contour, midline and mobile nontender   Adnexa  Without masses or tenderness    Anus and perineum  normal   Rectovaginal  normal sphincter tone without palpated masses or tenderness. First to second degree rectocele noted   Assessment/Plan:  72 y.o. [name] female.  1. Postmenopausal bleeding. Patient has had 2 episodes over the past year or so of vaginal spotting. It was attributed to vaginitis by her oncologist recommended a gynecologic exam given that she is on tamoxifen for her breast cancer. Patient's been told in the past and she had leiomyoma although not appreciated today on exam. Discussed the issues of postmenopausal spotting. Need to evaluate the endometrium discussed. Pap smear done today. We'll schedule sonohysterogram for endometrial assessment. Various possibilities to range from atrophic endometrium 2 hyperplastic or early invasive uterine cancer was  discussed. 2. Rectocele. Patient has first to second degree rectocele. She is asymptomatic and never been told this before. Recommend expectant management with followup if symptoms develop. 3. Pap smear done today. No history of abnormal Pap smears previously. 4. Mammography 04/2013. History of left lumpectomy with followup radiation and currently on tamoxifen. Actively being followed by her oncologist. SBE monthly reviewed. 5. DEXA 2013 with T score -2.3. Had been diagnosed previously with osteoporosis and treated with Reclast. Bone density stable from prior studies and she is not on any active medication at this time other than the beneficial side effect of tamoxifen. Tentative plan repeat DEXA next year at two-year interval per oncology followup who is monitoring this for her. 6. Colonoscopy 2010. Repeat at their recommended interval. 7. Health maintenance. No blood work done as it is all done through her primary physician's office. Followup for sonohysterogram.   Note: This document was prepared with digital dictation and possible smart phrase technology. Any transcriptional errors that result from this process are unintentional.   [name], 11:17 AM 05/26/2013

## 2013-05-27 ENCOUNTER — Other Ambulatory Visit: Payer: Self-pay | Admitting: Gynecology

## 2013-05-27 DIAGNOSIS — R822 Biliuria: Secondary | ICD-10-CM

## 2013-05-27 LAB — URINALYSIS W MICROSCOPIC + REFLEX CULTURE
Bacteria, UA: NONE SEEN
Casts: NONE SEEN
Hgb urine dipstick: NEGATIVE
Ketones, ur: NEGATIVE mg/dL
Specific Gravity, Urine: 1.028 (ref 1.005–1.030)
Urobilinogen, UA: 0.2 mg/dL (ref 0.0–1.0)

## 2013-05-28 ENCOUNTER — Other Ambulatory Visit: Payer: Self-pay | Admitting: Gynecology

## 2013-05-28 DIAGNOSIS — N950 Postmenopausal bleeding: Secondary | ICD-10-CM

## 2013-06-11 ENCOUNTER — Encounter: Payer: Self-pay | Admitting: Gynecology

## 2013-06-11 ENCOUNTER — Ambulatory Visit (INDEPENDENT_AMBULATORY_CARE_PROVIDER_SITE_OTHER): Payer: Medicare Other | Admitting: Gynecology

## 2013-06-11 ENCOUNTER — Ambulatory Visit (INDEPENDENT_AMBULATORY_CARE_PROVIDER_SITE_OTHER): Payer: Medicare Other

## 2013-06-11 DIAGNOSIS — N888 Other specified noninflammatory disorders of cervix uteri: Secondary | ICD-10-CM

## 2013-06-11 DIAGNOSIS — N840 Polyp of corpus uteri: Secondary | ICD-10-CM

## 2013-06-11 DIAGNOSIS — R822 Biliuria: Secondary | ICD-10-CM

## 2013-06-11 DIAGNOSIS — R102 Pelvic and perineal pain unspecified side: Secondary | ICD-10-CM

## 2013-06-11 DIAGNOSIS — R9389 Abnormal findings on diagnostic imaging of other specified body structures: Secondary | ICD-10-CM

## 2013-06-11 DIAGNOSIS — N882 Stricture and stenosis of cervix uteri: Secondary | ICD-10-CM

## 2013-06-11 DIAGNOSIS — N950 Postmenopausal bleeding: Secondary | ICD-10-CM

## 2013-06-11 DIAGNOSIS — N949 Unspecified condition associated with female genital organs and menstrual cycle: Secondary | ICD-10-CM

## 2013-06-11 DIAGNOSIS — N83339 Acquired atrophy of ovary and fallopian tube, unspecified side: Secondary | ICD-10-CM

## 2013-06-11 DIAGNOSIS — N8000 Endometriosis of the uterus, unspecified: Secondary | ICD-10-CM

## 2013-06-11 DIAGNOSIS — N72 Inflammatory disease of cervix uteri: Secondary | ICD-10-CM

## 2013-06-11 DIAGNOSIS — C50919 Malignant neoplasm of unspecified site of unspecified female breast: Secondary | ICD-10-CM

## 2013-06-11 LAB — COMPREHENSIVE METABOLIC PANEL
ALT: 12 U/L (ref 0–35)
AST: 19 U/L (ref 0–37)
Chloride: 108 mEq/L (ref 96–112)
Creat: 0.87 mg/dL (ref 0.50–1.10)
Sodium: 140 mEq/L (ref 135–145)
Total Bilirubin: 0.5 mg/dL (ref 0.3–1.2)

## 2013-06-11 MED ORDER — FLUCONAZOLE 150 MG PO TABS: 150.0000 mg | ORAL_TABLET | Freq: Once | ORAL | Status: DC

## 2013-06-11 MED ORDER — LIDOCAINE HCL 1 % IJ SOLN
5.0000 mL | Freq: Once | INTRAMUSCULAR | Status: AC
  Administered 2013-06-11: 5 mL

## 2013-06-11 NOTE — Patient Instructions (Signed)
Office will call you with biopsy results 

## 2013-06-11 NOTE — Progress Notes (Signed)
Patient presents for sonohysterogram with history of postmenopausal bleeding x2 episodes this past year. Currently is on tamoxifen for her breast cancer. Recent Pap smear returned normal.  Ultrasound shows uterus normal size and echotexture. There are some cortical cysts and nabothian cysts noted. Endometrium 8.5 mm with echogenic areas. Right and left ovaries grossly normal.  Sonohysterogram: Cervix was cleansed with Betadine and an initial attempt to pass a catheter was unsuccessful. A paracervical block using 1% lidocaine, 6 cc total was placed, anterior lip of the cervix was grasped with a single-tooth tenaculum and the catheter was gently navigated into the endometrial cavity. The tenaculum was removed and the sonohysterogram was performed. The appearance was somewhat suboptimal showing a ragged endometrial pattern with echogenic areas. An endometrial sample was taken. Patient tolerated well.  Assessment and plan: Postmenopausal bleeding in patient on tamoxifen. Endometrial echo generous with echogenic areas. Sonohysterogram showed a ragged endometrial interface no specific polyps. Endometrial sample was taken. Patient will followup with these results. Possible D&C hysteroscopy was discussed with her for a better endometrial visualization and sampling and we will further discuss pending the biopsy results.

## 2013-06-12 ENCOUNTER — Ambulatory Visit: Payer: Self-pay | Admitting: Gynecology

## 2013-06-12 LAB — URINALYSIS W MICROSCOPIC + REFLEX CULTURE
Casts: NONE SEEN
Glucose, UA: NEGATIVE mg/dL
Hgb urine dipstick: NEGATIVE
Ketones, ur: NEGATIVE mg/dL
pH: 5.5 (ref 5.0–8.0)

## 2013-06-18 ENCOUNTER — Other Ambulatory Visit: Payer: Self-pay | Admitting: Gynecology

## 2013-06-18 ENCOUNTER — Telehealth: Payer: Self-pay

## 2013-06-18 MED ORDER — MISOPROSTOL 200 MCG PO TABS: ORAL_TABLET | ORAL | Status: DC

## 2013-06-18 NOTE — Telephone Encounter (Signed)
Something I would recommend she follow up with her primary for.  Will not affect her having the D&C, but I would recommend her to follow up with her primary physician for.  With a normal serum bilirubin I doubt it is of concern.

## 2013-06-18 NOTE — Telephone Encounter (Signed)
Patient inquired about her labs from 06/11/13.  She did have large amount of bilirubin again in her urine and wondered what was causing and want you recommended. (Hysteroscopy, D&C scheduled 07/07/13.)

## 2013-06-19 ENCOUNTER — Telehealth: Payer: Self-pay

## 2013-06-19 NOTE — Telephone Encounter (Signed)
Patient informed. Results mailed to her to take to PCP.

## 2013-06-19 NOTE — Telephone Encounter (Signed)
I called patient and scheduled her D&C, Hysteroscopy for 07/07/13 7:30am at [facility].  She was instructed regarding need for Cytotec tab vaginally night before surgery. That was e-  scribed to her pharmacy.  Pre op consult with Dr. Velvet Bathe was scheduled for 06/27/13 9:40am.

## 2013-06-25 NOTE — Patient Instructions (Addendum)
Your procedure is scheduled on: 07/07/2013  Enter through the Main Entrance of [facility] at: 0600am  Pick up the phone at the desk and dial [phone].  Call this number if you have problems the morning of surgery: [phone].  Remember: Do NOT eat food: AFTER MIDNIGHT 07/06/2013 Do NOT drink clear liquids after:AFTER MIDNIGHT 07/06/2013 Take these medicines the morning of surgery with a SIP OF WATER:   Zantac, tamoxifen, claritin  Do NOT wear jewelry, make-up, or nail polish. Do NOT wear lotions, powders, or perfumes.  You may wear deordrant. Do NOT shave for 48 hours prior to surgery. Do NOT bring valuables to the hospital. Contacts, dentures, or bridgework may not be worn into surgery.  Have a responsible adult to drive you home and stay with you for the first 24 hours. Home with [facility] cell [phone] .

## 2013-06-27 ENCOUNTER — Ambulatory Visit (INDEPENDENT_AMBULATORY_CARE_PROVIDER_SITE_OTHER): Payer: Medicare Other | Admitting: Gynecology

## 2013-06-27 ENCOUNTER — Telehealth: Payer: Self-pay

## 2013-06-27 ENCOUNTER — Encounter (HOSPITAL_COMMUNITY): Payer: Self-pay

## 2013-06-27 ENCOUNTER — Other Ambulatory Visit: Payer: Self-pay

## 2013-06-27 ENCOUNTER — Encounter: Payer: Self-pay | Admitting: Gynecology

## 2013-06-27 ENCOUNTER — Encounter (HOSPITAL_COMMUNITY): Payer: Self-pay | Admitting: Pharmacy Technician

## 2013-06-27 ENCOUNTER — Encounter (HOSPITAL_COMMUNITY)
Admission: RE | Admit: 2013-06-27 | Discharge: 2013-06-27 | Disposition: A | Discharge status: Home / Self Care | Payer: Medicare Other | Source: Ambulatory Visit | Attending: Gynecology | Admitting: Gynecology

## 2013-06-27 DIAGNOSIS — Z01810 Encounter for preprocedural cardiovascular examination: Secondary | ICD-10-CM | POA: Insufficient documentation

## 2013-06-27 DIAGNOSIS — Z01812 Encounter for preprocedural laboratory examination: Secondary | ICD-10-CM | POA: Insufficient documentation

## 2013-06-27 DIAGNOSIS — Z01818 Encounter for other preprocedural examination: Secondary | ICD-10-CM | POA: Insufficient documentation

## 2013-06-27 DIAGNOSIS — N950 Postmenopausal bleeding: Secondary | ICD-10-CM

## 2013-06-27 HISTORY — DX: Cardiac murmur, unspecified: R01.1

## 2013-06-27 HISTORY — DX: Hypothyroidism, unspecified: E03.9

## 2013-06-27 HISTORY — DX: Headache: R51

## 2013-06-27 HISTORY — DX: Pneumonia, unspecified organism: J18.9

## 2013-06-27 HISTORY — DX: Gastro-esophageal reflux disease without esophagitis: K21.9

## 2013-06-27 HISTORY — DX: Personal history of urinary (tract) infections: Z87.440

## 2013-06-27 LAB — COMPREHENSIVE METABOLIC PANEL
ALT: 15 U/L (ref 0–35)
AST: 21 U/L (ref 0–37)
Albumin: 3.5 g/dL (ref 3.5–5.2)
Alkaline Phosphatase: 57 U/L (ref 39–117)
CO2: 29 mEq/L (ref 19–32)
Chloride: 103 mEq/L (ref 96–112)
Creatinine, Ser: 0.74 mg/dL (ref 0.50–1.10)
GFR calc non Af Amer: 83 mL/min — ABNORMAL LOW (ref >90)
Potassium: 4.5 mEq/L (ref 3.5–5.1)
Sodium: 142 mEq/L (ref 135–145)
Total Bilirubin: 0.4 mg/dL (ref 0.3–1.2)

## 2013-06-27 LAB — CBC
MCV: 88.3 fL (ref 78.0–100.0)
Platelets: 184 10*3/uL (ref 150–400)
RBC: 4.88 MIL/uL (ref 3.87–5.11)
RDW: 14.2 % (ref 11.5–15.5)
WBC: 6.4 10*3/uL (ref 4.0–10.5)

## 2013-06-27 NOTE — Progress Notes (Signed)
Joan Weaver Oct 06, 1941 [id number]   Preoperative consult  Chief complaint: Post menopausal bleeding  History of present illness: 72 y.o. [name] with 2 episodes of postmenopausal bleeding over the past year. Currently on tamoxifen for breast cancer. Had sonohysterogram which showed a ragged endometrial lining with sampling showing scant endometrial tissue, benign. Patient's admitted for hysteroscopy D&C for better visualization and sampling of the endometrium.  Past medical history,surgical history, medications, allergies, family history and social history were all reviewed and documented in the EPIC chart. ROS:  Was performed and pertinent positives and negatives are included in the history of present illness.  Exam:  Kim assistant General: well developed, well nourished female, no acute distress HEENT: normal  Lungs: clear to auscultation without wheezing, rales or rhonchi  Cardiac: regular rate without rubs, murmurs or gallops  Abdomen: soft, nontender without masses, guarding, rebound, organomegaly  Pelvic: external bus vagina: normal  with atrophic changes Cervix: grossly normal  Uterus: normal size, midline and mobile, nontender  Adnexa: without masses or tenderness      Assessment/Plan:  72 y.o. [name] with postmenopausal bleeding. On tamoxifen for breast cancer. Sonohysterogram with poor visualization of the endometrial cavity. Sampling showing scant endometrial tissue. Options for management were reviewed to include observation, more aggressive sampling attempt in the office versus hysteroscopy D&C and the patient elects for hysteroscopy D&C. I reviewed the proposed surgery with the patient including expected intraoperative and postoperative courses as well as the recovery period. Risks of infection, prolonged antibiotics, hemorrhage necessitating transfusion and the risks of transfusion, uterine perforation, damage to internal organs including bowel, bladder, ureters, nerves either  immediately recognized or delay recognized necessitating major exploratory reparative surgeries in the future reparative surgery including bowel resection, ostomy formation, bladder repair, ureteral damage repair. Distended media absorption leading to metabolic complications such as coma and seizures was also discussed with her. Patient's questions were answered to her satisfaction and she is ready to proceed with surgery.   Note: This document was prepared with digital dictation and possible smart phrase technology. Any transcriptional errors that result from this process are unintentional.  [name], 10:34 AM 06/27/2013

## 2013-06-27 NOTE — Telephone Encounter (Signed)
Okay, let me know

## 2013-06-27 NOTE — Patient Instructions (Signed)
Followup for surgery as scheduled. 

## 2013-06-27 NOTE — Telephone Encounter (Signed)
Patient scheduled for surgery on 07/07/13. Had her pre-op appt at [facility] this morning and BP was too high.  165/104, 168/122, 157/110  Joan Weaver said she checked it four times over an hour and a half and checked both arms.  Dr. Lemont Fillers wants patient to see PCP and get back on BP med and get BP down prior to surgery.  Evidently, per Joan Weaver, patient was on BP medication 3 years ago when husband was sick but stopped taking it.   I called patient and left message for her to call me.

## 2013-06-27 NOTE — Telephone Encounter (Signed)
Patient called me. Said she has not scheduled appt yet with PCP as she has just arrived home. She said she will get in with PCP on Monday. She said her BP has never been this high and not sure whats going on.  I told her we would need medical clearance from PCP and would appreciate something in writing we could fax to anesthesia.  She will ask PCP for that. Victorino Dike will follow-up on this next week as I will be out on vacation.

## 2013-06-27 NOTE — H&P (Signed)
  Joan Weaver 1941/03/11 [id number]   History and Physical  Chief complaint: Post menopausal bleeding  History of present illness: 72 y.o. [name] with 2 episodes of postmenopausal bleeding over the past year. Currently on tamoxifen for breast cancer. Had sonohysterogram which showed a ragged endometrial lining with sampling showing scant endometrial tissue, benign. Patient's admitted for hysteroscopy D&C for better visualization and sampling of the endometrium.  Past medical history,surgical history, medications, allergies, family history and social history were all reviewed and documented in the EPIC chart. ROS:  Was performed and pertinent positives and negatives are included in the history of present illness.  Exam:  Kim assistant General: well developed, well nourished female, no acute distress HEENT: normal  Lungs: clear to auscultation without wheezing, rales or rhonchi  Cardiac: regular rate without rubs, murmurs or gallops  Abdomen: soft, nontender without masses, guarding, rebound, organomegaly  Pelvic: external bus vagina: normal  with atrophic changes Cervix: grossly normal  Uterus: normal size, midline and mobile, nontender  Adnexa: without masses or tenderness      Assessment/Plan:  72 y.o. [name] with postmenopausal bleeding. On tamoxifen for breast cancer. Sonohysterogram with poor visualization of the endometrial cavity. Sampling showing scant endometrial tissue. Options for management were reviewed to include observation, more aggressive sampling attempt in the office versus hysteroscopy D&C and the patient elects for hysteroscopy D&C. I reviewed the proposed surgery with the patient including expected intraoperative and postoperative courses as well as the recovery period. Risks of infection, prolonged antibiotics, hemorrhage necessitating transfusion and the risks of transfusion, uterine perforation, damage to internal organs including bowel, bladder, ureters, nerves either  immediately recognized or delay recognized necessitating major exploratory reparative surgeries in the future reparative surgery including bowel resection, ostomy formation, bladder repair, ureteral damage repair. Distended media absorption leading to metabolic complications such as coma and seizures was also discussed with her. Patient's questions were answered to her satisfaction and she is ready to proceed with surgery.   Note: This document was prepared with digital dictation and possible smart phrase technology. Any transcriptional errors that result from this process are unintentional.  [name], 10:37 AM 06/27/2013

## 2013-06-27 NOTE — Pre-Procedure Instructions (Signed)
Reviewed patient's history with Dr Rodman Pickle.  Per Dr Rodman Pickle, patient needs to see PCP for elevated blood pressure readings at today's PAT appt..  Patient informed and Lynden Ang was called at Dr Reynold Bowen office for F/U prior to surgery.

## 2013-06-30 ENCOUNTER — Other Ambulatory Visit: Payer: Self-pay | Admitting: Family Medicine

## 2013-06-30 ENCOUNTER — Telehealth: Payer: Self-pay | Admitting: Family Medicine

## 2013-06-30 MED ORDER — AMLODIPINE BESYLATE 10 MG PO TABS: 10.0000 mg | ORAL_TABLET | Freq: Every day | ORAL | Status: DC

## 2013-06-30 NOTE — Telephone Encounter (Signed)
Pt aware and appt made for Thursday  

## 2013-06-30 NOTE — Telephone Encounter (Signed)
Start amlodipine 10 mg poqday.  I will e-scribe

## 2013-06-30 NOTE — Telephone Encounter (Signed)
Spoke with patient this am and PCP prescribed her some blood pressure medication, she will have a follow up visit with him to recheck on Thursday. I asked patient if PCP approves medical clearance to have him fax something in witting on thursday ASAP. I gave her fax # and pt will do this, I will call patient again on thursday.

## 2013-06-30 NOTE — Telephone Encounter (Signed)
Pt is getting ready to have surgery done on Monday and when she went for pre-op the other day her bp was high. She could not remember the top number, but the bottom was 104. They said that they cannot do surgery without her being on bp medication. Your first available appt is Thursday and she does not want to wait that long. Do you have any other suggestions for her?

## 2013-06-30 NOTE — Telephone Encounter (Signed)
Recheck bp on Thursday on new med.

## 2013-07-03 ENCOUNTER — Encounter: Payer: Self-pay | Admitting: Family Medicine

## 2013-07-03 ENCOUNTER — Ambulatory Visit (INDEPENDENT_AMBULATORY_CARE_PROVIDER_SITE_OTHER): Payer: Medicare Other | Admitting: Family Medicine

## 2013-07-03 VITALS — BP 124/82 | HR 78 | Temp 98.3°F | Resp 18 | Ht 61.0 in | Wt 184.0 lb

## 2013-07-03 DIAGNOSIS — I10 Essential (primary) hypertension: Secondary | ICD-10-CM

## 2013-07-03 NOTE — Progress Notes (Signed)
Subjective:    Patient ID: Joan Weaver, female    DOB: 1941-01-09, 72 y.o.   MRN: [id number]  HPI  Patient has a history of hypertension. However due to multiple medical problems we had weaned her off of blood pressure medicines successfully in the past. Over the last year she has not had a single office visit except one were blood pressure was elevated.  However recently she has been having some vaginal bleeding. She was referred to a gynecologist. The gynecologist wants to do a D&C and polypectomy because of postmenopausal vaginal bleeding. The patient was going for her regular preoperative visit and was found have a significantly elevated blood pressure in the 150s over 100-120.  She is asymptomatic at the time. She has been consuming more sodium recently. She has also been taking more NSAIDS for knee pain and hip pain.  She called my office and we began the patient on amlodipine 10 mg by mouth daily. She is on medicine for 3 days. Her blood pressure is 124/82 today. At home her blood pressures have been 130s over 80s except for one isolated 154/76.  She is asymptomatic. Past Medical History  Diagnosis Date  . Thyroid disease   . Osteoporosis   . Hernia   . Cataract   . Ulcer   . Urinary incontinence   . Hypertension     no meds x 3 yrs  . Heart murmur     slight murmur -never had any problems  . Hypothyroidism   . 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 H/O bladder infections   . GERD (gastroesophageal reflux disease)     minor - tx with zantac  . Headache(784.0)     otc med prn  . Breast cancer August 2011    left - lumpectomy  . Arthritis     knees, hips   Current Outpatient Prescriptions on File Prior to Visit  Medication Sig Dispense Refill  . acetaminophen (TYLENOL) 500 MG tablet Take 500 mg by mouth every 6 (six) hours as needed for pain.       Marland Kitchen amLODipine (NORVASC) 10 MG tablet Take 1 tablet (10 mg total) by mouth daily.  30  tablet  3  . BIOTIN FORTE PO Take 1 tablet by mouth daily.      Marland Kitchen CALCIUM PO Take 1 tablet by mouth 2 (two) times daily.      . Cholecalciferol (VITAMIN D) 1000 UNITS capsule Take 1,000 Units by mouth 2 (two) times daily.       . Cranberry 500 MG CAPS Take 2 capsules by mouth daily.       Marland Kitchen etodolac (LODINE) 500 MG tablet Take 1 tablet (500 mg total) by mouth 2 (two) times daily.  30 tablet  3  . fish oil-omega-3 fatty acids 1000 MG capsule Take 2 g by mouth daily.       Marland Kitchen glucosamine-chondroitin 500-400 MG tablet Take 1 tablet by mouth daily.      Marland Kitchen levothyroxine (SYNTHROID, LEVOTHROID) 25 MCG tablet Take 1 tablet (25 mcg total) by mouth daily.  30 tablet  5  . loratadine (CLARITIN) 10 MG tablet Take 10 mg by mouth daily.        . Magnesium 100 MG CAPS Take 1 capsule by mouth 2 (two) times daily.       . meclizine (ANTIVERT) 25 MG tablet Take 25 mg by mouth 3 (three) times daily as  needed for dizziness.       . misoprostol (CYTOTEC) 200 MCG tablet Insert VAGINALLY night before surgery.  1 tablet  0  . Multiple Vitamin (MULTIVITAMIN) capsule Take 1 capsule by mouth daily.        . Polyvinyl Alcohol-Povidone (CLEAR EYES ALL SEASONS OP) Apply 1 drop to eye daily as needed (dry eyes).      . pyridOXINE (VITAMIN B-6) 100 MG tablet Take 100 mg by mouth daily.        . ranitidine (ZANTAC) 150 MG tablet Take 150 mg by mouth daily.       . tamoxifen (NOLVADEX) 20 MG tablet Take 1 tablet (20 mg total) by mouth daily.  90 tablet  12  . vitamin E 400 UNIT capsule Take 400 Units by mouth daily.         No current facility-administered medications on file prior to visit.   Past Surgical History  Procedure Laterality Date  . Hiatal hernia repair      x 2  . Tonsillectomy    . Appendectomy    . Left cataract surgery    . Breast lumpectomy      left  . Knee surgery      Arthroscopic  . Eye surgery      left- cataract    Allergies  Allergen Reactions  . Advair Diskus [Fluticasone-Salmeterol]      Nervous and shakey  . Aspirin     If she takes too much, it burns her stomach  . Bactrim [Sulfamethoxazole W-Trimethoprim] Nausea Only  . Demerol Nausea Only   History   Social History  . Marital Status: Widowed    Spouse Name: N/A    Number of Children: 0  . Years of Education: N/A   Occupational History  . Retired      IT sales professional   Social History Main Topics  . Smoking status: Never Smoker   . Smokeless tobacco: Never Used  . Alcohol Use: No  . Drug Use: No  . Sexual Activity: Not Currently    Birth Control/ Protection: Post-menopausal   Other Topics Concern  . Not on file   Social History Narrative  . No narrative on file    Review of Systems  All other systems reviewed and are negative.       Objective:   Physical Exam  Vitals reviewed. Constitutional: She appears well-developed and well-nourished.  Neck: No thyromegaly present.  Cardiovascular: Normal rate, regular rhythm and normal heart sounds.  Exam reveals no gallop and no friction rub.   No murmur heard. Pulmonary/Chest: Effort normal and breath sounds normal.  Abdominal: Soft. Bowel sounds are normal.  Musculoskeletal: She exhibits no edema.          Assessment & Plan:  1. HTN (hypertension) I believe the elevation in her blood pressure was likely multifactorial stemming from anxiety, and sodium intake, and NSAID use along with underlying arterosclerosis.  I recommended she continue Norvasc 10 mg by mouth daily. I want her to check her blood pressure 2-3 times a day at home. I will see her back in 2 weeks to assess the effect of the blood pressure medication. I do for her blood pressure is now safe to proceed with surgery.

## 2013-07-03 NOTE — Telephone Encounter (Signed)
Pt saw Dr.Pickard today and a note was faxed stating medical clearance okay to proceed with surgery. Her blood pressure today was 124/82 after starting norvasc 10 mg po daily. She will be back in 2 weeks to have blood pressure recheck.

## 2013-07-06 MED ORDER — DEXTROSE 5 % IV SOLN
2.0000 g | INTRAVENOUS | Status: AC
  Administered 2013-07-07: 2 g via INTRAVENOUS
  Filled 2013-07-06: qty 2

## 2013-07-07 ENCOUNTER — Encounter (HOSPITAL_COMMUNITY): Payer: Self-pay | Admitting: Anesthesiology

## 2013-07-07 ENCOUNTER — Ambulatory Visit (HOSPITAL_COMMUNITY)
Admission: RE | Admit: 2013-07-07 | Discharge: 2013-07-07 | Disposition: A | Discharge status: Home / Self Care | Payer: Medicare Other | Source: Ambulatory Visit | Attending: Gynecology | Admitting: Gynecology

## 2013-07-07 ENCOUNTER — Ambulatory Visit (HOSPITAL_COMMUNITY): Payer: Medicare Other | Admitting: Anesthesiology

## 2013-07-07 ENCOUNTER — Encounter (HOSPITAL_COMMUNITY): Payer: Self-pay | Admitting: Registered Nurse

## 2013-07-07 ENCOUNTER — Encounter (HOSPITAL_COMMUNITY)
Admission: RE | Disposition: A | Discharge status: Home / Self Care | Payer: Self-pay | Source: Ambulatory Visit | Attending: Gynecology

## 2013-07-07 DIAGNOSIS — C50919 Malignant neoplasm of unspecified site of unspecified female breast: Secondary | ICD-10-CM | POA: Insufficient documentation

## 2013-07-07 DIAGNOSIS — N950 Postmenopausal bleeding: Secondary | ICD-10-CM | POA: Insufficient documentation

## 2013-07-07 HISTORY — PX: HYSTEROSCOPY WITH D & C: SHX1775

## 2013-07-07 SURGERY — DILATATION AND CURETTAGE /HYSTEROSCOPY
Anesthesia: General | Site: Vagina | Wound class: Clean Contaminated

## 2013-07-07 MED ORDER — DEXAMETHASONE SODIUM PHOSPHATE 10 MG/ML IJ SOLN
INTRAMUSCULAR | Status: AC
  Filled 2013-07-07: qty 1

## 2013-07-07 MED ORDER — ACETAMINOPHEN-CODEINE #4 300-60 MG PO TABS: 1.0000 | ORAL_TABLET | ORAL | Status: DC | PRN

## 2013-07-07 MED ORDER — PROPOFOL 10 MG/ML IV BOLUS
INTRAVENOUS | Status: DC | PRN
  Administered 2013-07-07: 160 mg via INTRAVENOUS

## 2013-07-07 MED ORDER — GLYCINE 1.5 % IR SOLN
Status: DC | PRN
  Administered 2013-07-07: 3000 mL

## 2013-07-07 MED ORDER — LIDOCAINE HCL (CARDIAC) 20 MG/ML IV SOLN
INTRAVENOUS | Status: DC | PRN
  Administered 2013-07-07: 50 mg via INTRAVENOUS

## 2013-07-07 MED ORDER — FENTANYL CITRATE 0.05 MG/ML IJ SOLN
INTRAMUSCULAR | Status: AC
  Filled 2013-07-07: qty 2

## 2013-07-07 MED ORDER — ONDANSETRON HCL 4 MG/2ML IJ SOLN
INTRAMUSCULAR | Status: DC | PRN
  Administered 2013-07-07: 4 mg via INTRAVENOUS

## 2013-07-07 MED ORDER — LIDOCAINE HCL 1 % IJ SOLN
INTRAMUSCULAR | Status: DC | PRN
  Administered 2013-07-07: 8 mL

## 2013-07-07 MED ORDER — DEXAMETHASONE SODIUM PHOSPHATE 10 MG/ML IJ SOLN
INTRAMUSCULAR | Status: DC | PRN
  Administered 2013-07-07: 10 mg via INTRAVENOUS

## 2013-07-07 MED ORDER — ONDANSETRON HCL 4 MG/2ML IJ SOLN
INTRAMUSCULAR | Status: AC
  Filled 2013-07-07: qty 2

## 2013-07-07 MED ORDER — LACTATED RINGERS IV SOLN
INTRAVENOUS | Status: DC
  Administered 2013-07-07 (×2): via INTRAVENOUS

## 2013-07-07 MED ORDER — PROPOFOL 10 MG/ML IV EMUL
INTRAVENOUS | Status: AC
  Filled 2013-07-07: qty 20

## 2013-07-07 MED ORDER — METOCLOPRAMIDE HCL 5 MG/ML IJ SOLN: 10.0000 mg | Freq: Once | INTRAMUSCULAR | Status: DC | PRN

## 2013-07-07 MED ORDER — FENTANYL CITRATE 0.05 MG/ML IJ SOLN: 25.0000 ug | INTRAMUSCULAR | Status: DC | PRN

## 2013-07-07 MED ORDER — FENTANYL CITRATE 0.05 MG/ML IJ SOLN
INTRAMUSCULAR | Status: DC | PRN
  Administered 2013-07-07 (×2): 50 ug via INTRAVENOUS

## 2013-07-07 MED ORDER — LIDOCAINE HCL (CARDIAC) 20 MG/ML IV SOLN
INTRAVENOUS | Status: AC
  Filled 2013-07-07: qty 5

## 2013-07-07 SURGICAL SUPPLY — 16 items
CANISTER SUCTION 2500CC (MISCELLANEOUS) ×2 IMPLANT
CATH ROBINSON RED A/P 16FR (CATHETERS) ×2 IMPLANT
CLOTH BEACON ORANGE TIMEOUT ST (SAFETY) ×2 IMPLANT
CONTAINER PREFILL 10% NBF 60ML (FORM) ×4 IMPLANT
DRESSING TELFA 8X3 (GAUZE/BANDAGES/DRESSINGS) ×2 IMPLANT
ELECT REM PT RETURN 9FT ADLT (ELECTROSURGICAL) ×2
ELECTRODE REM PT RTRN 9FT ADLT (ELECTROSURGICAL) ×1 IMPLANT
GLOVE BIO SURGEON STRL SZ7.5 (GLOVE) ×2 IMPLANT
GLOVE BIOGEL PI IND STRL 8 (GLOVE) ×1 IMPLANT
GLOVE BIOGEL PI INDICATOR 8 (GLOVE) ×1
GOWN STRL REIN XL XLG (GOWN DISPOSABLE) ×4 IMPLANT
LOOP ANGLED CUTTING 22FR (CUTTING LOOP) ×2 IMPLANT
PACK HYSTEROSCOPY LF (CUSTOM PROCEDURE TRAY) ×2 IMPLANT
PAD OB MATERNITY 4.3X12.25 (PERSONAL CARE ITEMS) ×2 IMPLANT
TOWEL OR 17X24 6PK STRL BLUE (TOWEL DISPOSABLE) ×4 IMPLANT
WATER STERILE IRR 1000ML POUR (IV SOLUTION) ×2 IMPLANT

## 2013-07-07 NOTE — H&P (Signed)
  The patient was examined.  I reviewed the proposed surgery and consent form with the patient.  The dictated history and physical is current and accurate and all questions were answered. The patient is ready to proceed with surgery and has a realistic understanding and expectation for the outcome.   [name], 7:12 AM 07/07/2013

## 2013-07-07 NOTE — Transfer of Care (Signed)
Immediate Anesthesia Transfer of Care Note  Patient: Joan Weaver  Procedure(s) Performed: Procedure(s): DILATATION AND CURETTAGE /HYSTEROSCOPY (N/A)  Patient Location: PACU  Anesthesia Type:General  Level of Consciousness: awake, alert  and oriented  Airway & Oxygen Therapy: Patient Spontanous Breathing and Patient connected to nasal cannula oxygen  Post-op Assessment: Report given to PACU RN  Post vital signs: Reviewed  Complications: No apparent anesthesia complications

## 2013-07-07 NOTE — Anesthesia Postprocedure Evaluation (Signed)
  Anesthesia Post-op Note  Patient: Joan Weaver  Procedure(s) Performed: Procedure(s): DILATATION AND CURETTAGE /HYSTEROSCOPY (N/A)  Patient Location: PACU  Anesthesia Type:General  Level of Consciousness: awake, alert  and oriented  Airway and Oxygen Therapy: Patient Spontanous Breathing  Post-op Pain: none  Post-op Assessment: Post-op Vital signs reviewed, Patient's Cardiovascular Status Stable, Respiratory Function Stable, Patent Airway, No signs of Nausea or vomiting and Pain level controlled  Post-op Vital Signs: Reviewed and stable  Complications: No apparent anesthesia complications

## 2013-07-07 NOTE — Preoperative (Signed)
Beta Blockers   Reason not to administer Beta Blockers:Not Applicable 

## 2013-07-07 NOTE — Anesthesia Preprocedure Evaluation (Signed)
Anesthesia Evaluation  Patient identified by MRN, date of birth, ID band Patient awake    Reviewed: Allergy & Precautions, H&P , NPO status , Patient's Chart, lab work & pertinent test results  Airway       Dental  (+) Dental Advisory Given   Pulmonary pneumonia -, resolved,          Cardiovascular hypertension, Pt. on medications + Valvular Problems/Murmurs     Neuro/Psych  Headaches, negative psych ROS   GI/Hepatic Neg liver ROS, GERD-  Medicated,  Endo/Other  Hypothyroidism   Renal/GU negative Renal ROS     Musculoskeletal negative musculoskeletal ROS (+)   Abdominal (+) + obese,   Peds  Hematology negative hematology ROS (+)   Anesthesia Other Findings   Reproductive/Obstetrics negative OB ROS                           Anesthesia Physical Anesthesia Plan  ASA: II  Anesthesia Plan: General   Post-op Pain Management:    Induction: Intravenous  Airway Management Planned: LMA  Additional Equipment:   Intra-op Plan:   Post-operative Plan: Extubation in OR  Informed Consent: I have reviewed the patients History and Physical, chart, labs and discussed the procedure including the risks, benefits and alternatives for the proposed anesthesia with the patient or authorized representative who has indicated his/her understanding and acceptance.   Dental advisory given  Plan Discussed with: CRNA  Anesthesia Plan Comments:         Anesthesia Quick Evaluation

## 2013-07-07 NOTE — Op Note (Signed)
Joan Weaver 11-24-1940 [id number]   Post Operative Note   Date of surgery:  07/07/2013  Pre Op Dx:  Postmenopausal bleeding  Post Op Dx:  Postmenopausal bleeding  Procedure:  Hysteroscopy, D&C  Surgeon:  Dara Lords  Anesthesia:  General  EBL:  Minimal  Distended media discrepancy:  25 cc glycine  Complications:  None  Specimen:  Endometrial curetting to pathology  Findings: EUA:  External BUS vagina with atrophic changes. Cervix grossly normal. Uterus normal size midline and mobile. Adnexa without masses   Hysteroscopic: Atrophic pattern without evidence of polyps, synechiae or other pathology. Fundus, anterior/posterior endometrial surfaces, lower uterine segment, endocervical canal, right/left tubal ostia old visualized.  Procedure:  The patient was taken to the operating room, underwent general anesthesia, was placed in the low dorsal lithotomy position, received a perineal/vaginal preparation with Betadine solution, bladder emptied with an in and out Foley catheterization and an EUA was performed. The time out was performed by the surgical team. The cervix was visualized with a speculum, anterior lip grasped with a single-tooth tenaculum and a paracervical block using 8 cc 1% lidocaine was placed without difficulty. The cervix was gently dilated to admit the diagnostic hysteroscope and hysteroscopy was performed with findings noted above. A sharp curettage was then performed noting scant return which was sent to pathology. Repeat hysteroscopy showed an empty cavity, good hemostasis, good distention with no evidence of perforation. The instruments were removed and the cervix was observed showing good hemostasis at the tenaculum site and the cervical os. The patient was placed in the supine position, awakened without difficulty and taken to the recovery room in good condition having tolerated the procedure well.   Note: This document was prepared with digital dictation and  possible smart phrase technology. Any transcriptional errors that result from this process are unintentional.  [name], 8:10 AM 07/07/2013

## 2013-07-08 ENCOUNTER — Encounter (HOSPITAL_COMMUNITY): Payer: Self-pay | Admitting: Gynecology

## 2013-07-10 ENCOUNTER — Other Ambulatory Visit: Payer: Self-pay | Admitting: Family Medicine

## 2013-07-17 ENCOUNTER — Ambulatory Visit (INDEPENDENT_AMBULATORY_CARE_PROVIDER_SITE_OTHER): Payer: Medicare Other | Admitting: Family Medicine

## 2013-07-17 ENCOUNTER — Encounter: Payer: Self-pay | Admitting: Family Medicine

## 2013-07-17 ENCOUNTER — Encounter: Payer: Medicare Other | Admitting: Family Medicine

## 2013-07-17 VITALS — BP 114/78 | HR 68 | Temp 97.4°F | Resp 18 | Wt 177.0 lb

## 2013-07-17 DIAGNOSIS — Z23 Encounter for immunization: Secondary | ICD-10-CM

## 2013-07-17 DIAGNOSIS — I10 Essential (primary) hypertension: Secondary | ICD-10-CM

## 2013-07-17 NOTE — Progress Notes (Signed)
Subjective:    Patient ID: Joan Weaver, female    DOB: [birth year], 72 y.o.   MRN: [id number]  HPI 07/03/13  Patient has a history of hypertension. However due to multiple medical problems we had weaned her off of blood pressure medicines successfully in the past. Over the last year she has not had a single office visit except one were blood pressure was elevated.  However recently she has been having some vaginal bleeding. She was referred to a gynecologist. The gynecologist wants to do a D&C and polypectomy because of postmenopausal vaginal bleeding. The patient was going for her regular preoperative visit and was found have a significantly elevated blood pressure in the 150s over 100-120.  She is asymptomatic at the time. She has been consuming more sodium recently. She has also been taking more NSAIDS for knee pain and hip pain.  She called my office and we began the patient on amlodipine 10 mg by mouth daily. She is on medicine for 3 days. Her blood pressure is 124/82 today. At home her blood pressures have been 130s over 80s except for one isolated 154/76.  She is asymptomatic.  At that time, my plan was: 1. HTN (hypertension) I believe the elevation in her blood pressure was likely multifactorial stemming from anxiety, and sodium intake, and NSAID use along with underlying arterosclerosis.  I recommended she continue Norvasc 10 mg by mouth daily. I want her to check her blood pressure 2-3 times a day at home. I will see her back in 2 weeks to assess the effect of the blood pressure medication. I do for her blood pressure is now safe to proceed with surgery.  She is here today for follow up. She had to decrease amlodipine to 5 mg a day due to dizziness.  Her blood pressures at home have been widely variable. She said blood pressure is low as 89/55.  However the vast majority of her blood pressures are 140-180/80-100.  She's had 2 episodes of low blood pressure.  Past Medical History  Diagnosis  Date  . Thyroid disease   . Osteoporosis   . Hernia   . Cataract   . Ulcer   . Urinary incontinence   . Hypertension     no meds x 3 yrs  . Heart murmur     slight murmur -never had any problems  . Hypothyroidism   . 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 H/O bladder infections   . GERD (gastroesophageal reflux disease)     minor - tx with zantac  . Headache(784.0)     otc med prn  . Breast cancer August 2011    left - lumpectomy  . Arthritis     knees, hips   Current Outpatient Prescriptions on File Prior to Visit  Medication Sig Dispense Refill  . acetaminophen (TYLENOL) 500 MG tablet Take 500 mg by mouth every 6 (six) hours as needed for pain.       Marland Kitchen amLODipine (NORVASC) 10 MG tablet Take 1 tablet (10 mg total) by mouth daily.  30 tablet  3  . BIOTIN FORTE PO Take 1 tablet by mouth daily.      Marland Kitchen CALCIUM PO Take 1 tablet by mouth 2 (two) times daily.      . Cholecalciferol (VITAMIN D) 1000 UNITS capsule Take 1,000 Units by mouth 2 (two) times daily.       . Cranberry 500 MG  CAPS Take 2 capsules by mouth daily.       Marland Kitchen etodolac (LODINE) 500 MG tablet TAKE 1 TABLET (500 MG TOTAL) BY MOUTH 2 (TWO) TIMES DAILY.  30 tablet  3  . fish oil-omega-3 fatty acids 1000 MG capsule Take 2 g by mouth daily.       Marland Kitchen glucosamine-chondroitin 500-400 MG tablet Take 1 tablet by mouth daily.      Marland Kitchen levothyroxine (SYNTHROID, LEVOTHROID) 25 MCG tablet Take 1 tablet (25 mcg total) by mouth daily.  30 tablet  5  . loratadine (CLARITIN) 10 MG tablet Take 10 mg by mouth daily.        . Magnesium 100 MG CAPS Take 1 capsule by mouth 2 (two) times daily.       . meclizine (ANTIVERT) 25 MG tablet Take 25 mg by mouth 3 (three) times daily as needed for dizziness.       . Multiple Vitamin (MULTIVITAMIN) capsule Take 1 capsule by mouth daily.        . Polyvinyl Alcohol-Povidone (CLEAR EYES ALL SEASONS OP) Apply 1 drop to eye daily as needed (dry eyes).      .  pyridOXINE (VITAMIN B-6) 100 MG tablet Take 100 mg by mouth daily.        . ranitidine (ZANTAC) 150 MG tablet Take 150 mg by mouth daily.       . tamoxifen (NOLVADEX) 20 MG tablet Take 1 tablet (20 mg total) by mouth daily.  90 tablet  12  . vitamin E 400 UNIT capsule Take 400 Units by mouth daily.        Marland Kitchen acetaminophen-codeine (TYLENOL #4) 300-60 MG per tablet Take 1 tablet by mouth every 4 (four) hours as needed for pain.  20 tablet  0   No current facility-administered medications on file prior to visit.   Past Surgical History  Procedure Laterality Date  . Hiatal hernia repair      x 2  . Tonsillectomy    . Appendectomy    . Left cataract surgery    . Breast lumpectomy      left  . Knee surgery      Arthroscopic  . Eye surgery      left- cataract   . Hysteroscopy w/d&c N/A 07/07/2013    Procedure: DILATATION AND CURETTAGE /HYSTEROSCOPY;  Surgeon: Dara Lords, MD;  Location: [facility];  Service: Gynecology;  Laterality: N/A;   Allergies  Allergen Reactions  . Advair Diskus [Fluticasone-Salmeterol]     Nervous and shakey  . Aspirin     If she takes too much, it burns her stomach  . Bactrim [Sulfamethoxazole-Trimethoprim] Nausea Only  . Demerol Nausea Only   History   Social History  . Marital Status: Widowed    Spouse Name: N/A    Number of Children: 0  . Years of Education: N/A   Occupational History  . Retired      IT sales professional   Social History Main Topics  . Smoking status: Never Smoker   . Smokeless tobacco: Never Used  . Alcohol Use: No  . Drug Use: No  . Sexual Activity: Not Currently    Birth Control/ Protection: Post-menopausal   Other Topics Concern  . Not on file   Social History Narrative  . No narrative on file    Review of Systems  All other systems reviewed and are negative.       Objective:   Physical Exam  Vitals reviewed. Constitutional: She appears well-developed  and well-nourished.  Neck: No thyromegaly present.   Cardiovascular: Normal rate, regular rhythm and normal heart sounds.  Exam reveals no gallop and no friction rub.   No murmur heard. Pulmonary/Chest: Effort normal and breath sounds normal.  Abdominal: Soft. Bowel sounds are normal.  Musculoskeletal: She exhibits no edema.          Assessment & Plan:  1. Need for prophylactic vaccination and inoculation against influenza - Flu Vaccine QUAD 36+ mos PF IM (Fluarix)  2. HTN (hypertension) Continue amlodipine 5 mg every day. Brain your blood pressure cuff by the office and let us check it against our blood pressure cuff.  If the cuff is inaccurate and her blood pressure is actually lower than measured readings, we may need to continue amlodipine at 5 mg a day. If her blood pressure cuff is accurate, I would add hydrochlorothiazide 12.5 mg by mouth daily and recheck blood pressure in 2 weeks.

## 2013-07-17 NOTE — Progress Notes (Signed)
Patient ID: Joan Weaver, female   DOB: June 01, 1941, 72 y.o.   MRN: [id number] Pt came in to get BP checked as instructed by provider, she brought in her own electronic monitor and compared it to our manual hers showed that it was 158/85 ours was 142/90.

## 2013-07-18 ENCOUNTER — Telehealth: Payer: Self-pay | Admitting: Family Medicine

## 2013-07-18 ENCOUNTER — Ambulatory Visit (INDEPENDENT_AMBULATORY_CARE_PROVIDER_SITE_OTHER): Payer: Medicare Other | Admitting: Gynecology

## 2013-07-18 ENCOUNTER — Encounter: Payer: Self-pay | Admitting: Gynecology

## 2013-07-18 DIAGNOSIS — Z9889 Other specified postprocedural states: Secondary | ICD-10-CM

## 2013-07-18 NOTE — Progress Notes (Signed)
Patient presents for her postoperative visit status post hysteroscopic D&C. Findings were an atrophic endometrium visually and histologically. Patient has done well without complaints.  Exam with Selena Batten assistant Pelvic external BUS vagina with atrophic changes. 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 Cervix normal. Uterus normal size mobile nontender. Adnexa without masses.  Assessment and plan: Normal postop check status post hysteroscopy D&C. Patient will report any further bleeding. Followup in one year for annual exam.

## 2013-07-18 NOTE — Telephone Encounter (Signed)
.  Patient aware and has been taking 1/2 of a 10 mg so she will just start taking a whole tab and come in to let us recheck her BP.

## 2013-07-18 NOTE — Telephone Encounter (Signed)
Message copied by Ricard Dillon on Fri Jul 18, 2013  8:53 AM ------      Message from: Lynnea Ferrier      Created: Fri Jul 18, 2013  7:44 AM       Her BP cuff was 16 points higher than ours.  I believe her numbers at home may be artificially elevated.  Increase amlodipine to 10 mg poqday and come in for BP check here in 2 weeks.  I would like her to have [place] check her BP rather than depend on cuff.  This does not have to be an OV (i don't want her charged). ------

## 2013-07-18 NOTE — Patient Instructions (Signed)
Followup in a year for your annual exam.

## 2013-07-18 NOTE — Progress Notes (Signed)
This encounter was created in error - please disregard.

## 2013-08-07 ENCOUNTER — Ambulatory Visit: Payer: Medicare Other | Admitting: *Deleted

## 2013-08-07 VITALS — BP 130/88

## 2013-08-07 DIAGNOSIS — I10 Essential (primary) hypertension: Secondary | ICD-10-CM

## 2013-08-08 ENCOUNTER — Telehealth: Payer: Self-pay | Admitting: Family Medicine

## 2013-08-08 MED ORDER — HYDROCHLOROTHIAZIDE 25 MG PO TABS: 25.0000 mg | ORAL_TABLET | Freq: Every day | ORAL | Status: DC

## 2013-08-08 NOTE — Telephone Encounter (Signed)
Patient aware and med sent to pharm 

## 2013-08-08 NOTE — Telephone Encounter (Signed)
Message copied by Ricard Dillon on Fri Aug 08, 2013  3:10 PM ------      Message from: Lynnea Ferrier      Created: Fri Aug 08, 2013  7:44 AM       Notify patient BP is still too high.  Add hctz 25 mg poqday to amlodipine. ------

## 2013-08-21 ENCOUNTER — Encounter: Payer: Self-pay | Admitting: Oncology

## 2013-08-21 ENCOUNTER — Other Ambulatory Visit (HOSPITAL_BASED_OUTPATIENT_CLINIC_OR_DEPARTMENT_OTHER): Payer: Medicare Other | Admitting: Lab

## 2013-08-21 ENCOUNTER — Encounter (INDEPENDENT_AMBULATORY_CARE_PROVIDER_SITE_OTHER): Payer: Self-pay

## 2013-08-21 ENCOUNTER — Ambulatory Visit (HOSPITAL_BASED_OUTPATIENT_CLINIC_OR_DEPARTMENT_OTHER): Payer: Medicare Other | Admitting: Oncology

## 2013-08-21 ENCOUNTER — Telehealth: Payer: Self-pay | Admitting: *Deleted

## 2013-08-21 VITALS — BP 139/85 | HR 86 | Temp 98.3°F | Resp 18 | Ht 61.0 in | Wt 186.8 lb

## 2013-08-21 DIAGNOSIS — C50912 Malignant neoplasm of unspecified site of left female breast: Secondary | ICD-10-CM

## 2013-08-21 DIAGNOSIS — Z170 Estrogen receptor positive status [ER+]: Secondary | ICD-10-CM

## 2013-08-21 DIAGNOSIS — M1990 Unspecified osteoarthritis, unspecified site: Secondary | ICD-10-CM

## 2013-08-21 DIAGNOSIS — C50919 Malignant neoplasm of unspecified site of unspecified female breast: Secondary | ICD-10-CM

## 2013-08-21 DIAGNOSIS — M899 Disorder of bone, unspecified: Secondary | ICD-10-CM

## 2013-08-21 LAB — CBC WITH DIFFERENTIAL/PLATELET
BASO%: 0.8 % (ref 0.0–2.0)
Basophils Absolute: 0 10*3/uL (ref 0.0–0.1)
EOS%: 1.5 % (ref 0.0–7.0)
Eosinophils Absolute: 0.1 10*3/uL (ref 0.0–0.5)
HCT: 42.9 % (ref 34.8–46.6)
HGB: 13.9 g/dL (ref 11.6–15.9)
LYMPH%: 30 % (ref 14.0–49.7)
MCH: 28.9 pg (ref 25.1–34.0)
MCHC: 32.5 g/dL (ref 31.5–36.0)
MCV: 89 fL (ref 79.5–101.0)
MONO#: 0.6 10*3/uL (ref 0.1–0.9)
MONO%: 10.5 % (ref 0.0–14.0)
NEUT#: 3.4 10*3/uL (ref 1.5–6.5)
NEUT%: 57.2 % (ref 38.4–76.8)
Platelets: 203 10*3/uL (ref 145–400)
RBC: 4.82 10*6/uL (ref 3.70–5.45)
RDW: 13.9 % (ref 11.2–14.5)
WBC: 6 10*3/uL (ref 3.9–10.3)
lymph#: 1.8 10*3/uL (ref 0.9–3.3)

## 2013-08-21 LAB — COMPREHENSIVE METABOLIC PANEL (CC13)
ALT: 15 U/L (ref 0–55)
AST: 22 U/L (ref 5–34)
Albumin: 3.4 g/dL — ABNORMAL LOW (ref 3.5–5.0)
Alkaline Phosphatase: 60 U/L (ref 40–150)
Anion Gap: 12 meq/L — ABNORMAL HIGH (ref 3–11)
BUN: 20 mg/dL (ref 7.0–26.0)
CO2: 24 meq/L (ref 22–29)
Calcium: 9.6 mg/dL (ref 8.4–10.4)
Chloride: 105 meq/L (ref 98–109)
Creatinine: 1 mg/dL (ref 0.6–1.1)
Glucose: 89 mg/dL (ref 70–140)
Potassium: 3.6 meq/L (ref 3.5–5.1)
Sodium: 141 meq/L (ref 136–145)
Total Bilirubin: 0.43 mg/dL (ref 0.20–1.20)
Total Protein: 7 g/dL (ref 6.4–8.3)

## 2013-08-21 NOTE — Telephone Encounter (Signed)
appts made and printed...td 

## 2013-08-21 NOTE — Progress Notes (Signed)
OFFICE PROGRESS NOTE  CC  [name], MD [address] [place] [address] Dr. Dorothy Puffer Dr. 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  STAGE: Left breast at the 9:00 position T1 N0 (stage I) ER +98% PR +97% HER-2/neu negative, Ki-67 18% Invasive ductal carcinoma  PRIOR THERAPY:  #1 patient presented at the age of 66 for a screening mammogram in 2011 that showed a suspicious mass in the left 9:00 position. Biopsy showed invasive ductal carcinoma HER-2/neu negative ER positive PR positive respectively at 98 and 97% proliferation marker Ki-67 18%. She had a MRI that showed a solitary enhancing mass in the medial aspect of the left breast measuring 8 x 6 x 10 mm.  #2 05/11/2010 patient underwent a lumpectomy that showed invasive ductal carcinoma measuring 6.0 mm, grade 2, no angiolymphatic invasion. All resection margins were negative. 2 sentinel nodes were negative for metastatic disease. Tumor was strongly ER +98% PR +97%.  #3 patient subsequently received radiation therapy by Dr. Dorothy Puffer which she completed in August 2011.  #4 she was then begun on tamoxifen 20 mg daily starting in September 2011. Patient also had osteopenia so she was also placed on Zometa. But this was discontinued and we will begin prolia.  CURRENT THERAPY: Tamoxifen 20 mg daily  INTERVAL HISTORY: Joan Weaver 72 y.o. female returns for followup visit. Overall she's doing well she is still having some aches and pains in her lower extremities from arthritis. This is been ongoing. She denies any fevers chills night sweats headaches shortness of breath she's had some history of recurrent pneumonias. She does have some myalgias as well as arthralgias as noted. She has occasional hot flashes. She has no vaginal discharge no bleeding no lower extremity swelling. Remainder of the 10 point review of  systems is negative.  MEDICAL HISTORY: Past Medical History  Diagnosis Date  . Thyroid disease   . Osteoporosis   . Hernia   . Cataract   . Ulcer   . Urinary incontinence   . Hypertension     no meds x 3 yrs  . Heart murmur     slight murmur -never had any problems  . Hypothyroidism   . 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 H/O bladder infections   . GERD (gastroesophageal reflux disease)     minor - tx with zantac  . Headache(784.0)     otc med prn  . Breast cancer August 2011    left - lumpectomy  . Arthritis     knees, hips    ALLERGIES:  is allergic to advair diskus; aspirin; bactrim; and demerol.  MEDICATIONS:  Current Outpatient Prescriptions  Medication Sig Dispense Refill  . acetaminophen (TYLENOL) 500 MG tablet Take 500 mg by mouth every 6 (six) hours as needed for pain.       Marland Kitchen amLODipine (NORVASC) 10 MG tablet Take 1 tablet (10 mg total) by mouth daily.  30 tablet  3  . BIOTIN FORTE PO Take 1 tablet by mouth daily.      Marland Kitchen CALCIUM PO Take 1 tablet by mouth 2 (two) times daily.      . Cholecalciferol (VITAMIN D) 1000 UNITS capsule Take 1,000 Units by mouth daily.       . Cranberry 500 MG CAPS Take 2 capsules by mouth daily.       Marland Kitchen  etodolac (LODINE) 500 MG tablet TAKE 1 TABLET (500 MG TOTAL) BY MOUTH 2 (TWO) TIMES DAILY.  30 tablet  3  . fish oil-omega-3 fatty acids 1000 MG capsule Take 2 g by mouth daily.       Marland Kitchen glucosamine-chondroitin 500-400 MG tablet Take 1 tablet by mouth daily.      . hydrochlorothiazide (HYDRODIURIL) 25 MG tablet Take 1 tablet (25 mg total) by mouth daily.  30 tablet  3  . levothyroxine (SYNTHROID, LEVOTHROID) 25 MCG tablet Take 1 tablet (25 mcg total) by mouth daily.  30 tablet  5  . loratadine (CLARITIN) 10 MG tablet Take 10 mg by mouth daily.        . Magnesium 100 MG CAPS Take 1 capsule by mouth 2 (two) times daily.       . meclizine (ANTIVERT) 25 MG tablet Take 25 mg by mouth 3 (three)  times daily as needed for dizziness.       . Multiple Vitamin (MULTIVITAMIN) capsule Take 1 capsule by mouth daily.        . Polyvinyl Alcohol-Povidone (CLEAR EYES ALL SEASONS OP) Apply 1 drop to eye daily as needed (dry eyes).      . pyridOXINE (VITAMIN B-6) 100 MG tablet Take 100 mg by mouth daily.        . ranitidine (ZANTAC) 150 MG tablet Take 150 mg by mouth daily.       . tamoxifen (NOLVADEX) 20 MG tablet Take 1 tablet (20 mg total) by mouth daily.  90 tablet  12  . vitamin E 400 UNIT capsule Take 400 Units by mouth daily.         No current facility-administered medications for this visit.    SURGICAL HISTORY:  Past Surgical History  Procedure Laterality Date  . Hiatal hernia repair      x 2  . Tonsillectomy    . Appendectomy    . Left cataract surgery    . Breast lumpectomy      left  . Knee surgery      Arthroscopic  . Eye surgery      left- cataract   . Hysteroscopy w/d&c N/A 07/07/2013    Procedure: DILATATION AND CURETTAGE /HYSTEROSCOPY;  Surgeon: Dara Lords, MD;  Location: [facility];  Service: Gynecology;  Laterality: N/A;    REVIEW OF SYSTEMS:  Pertinent items are noted in HPI.   HEALTH MAINTENANCE:   PHYSICAL EXAMINATION: Blood pressure 139/85, pulse 86, temperature 98.3 F (36.8 C), temperature source Oral, resp. rate 18, height 5\' 1"  (1.549 m), weight 186 lb 12.8 oz (84.732 kg). Body mass index is 35.31 kg/(m^2). ECOG PERFORMANCE STATUS: 0 - Asymptomatic   Well-developed nourished female in no acute distress HEENT exam EOMI PERRLA oral mucosa is moist neck is supple lungs clear to auscultation cardiovascular regular rate rhythm abdomen soft nontender extremities trace edema neuro is nonfocal breast exam left breast reveals well-healed surgical scar no nodularity no erythema no evidence of local recurrence right breast no masses or nipple discharge.   LABORATORY DATA: Lab Results  Component Value Date   WBC 6.0 08/21/2013   HGB 13.9 08/21/2013   HCT  42.9 08/21/2013   MCV 89.0 08/21/2013   PLT 203 08/21/2013      Chemistry      Component Value Date/Time   NA 141 08/21/2013 1139   NA 142 06/27/2013 1240   K 3.6 08/21/2013 1139   K 4.5 06/27/2013 1240   CL 103 06/27/2013 1240  CL 108* 07/30/2012 1318   CO2 24 08/21/2013 1139   CO2 29 06/27/2013 1240   BUN 20.0 08/21/2013 1139   BUN 18 06/27/2013 1240   CREATININE 1.0 08/21/2013 1139   CREATININE 0.74 06/27/2013 1240   CREATININE 0.87 06/11/2013 1128      Component Value Date/Time   CALCIUM 9.6 08/21/2013 1139   CALCIUM 10.1 06/27/2013 1240   ALKPHOS 60 08/21/2013 1139   ALKPHOS 57 06/27/2013 1240   AST 22 08/21/2013 1139   AST 21 06/27/2013 1240   ALT 15 08/21/2013 1139   ALT 15 06/27/2013 1240   BILITOT 0.43 08/21/2013 1139   BILITOT 0.4 06/27/2013 1240       RADIOGRAPHIC STUDIES:  No results found.  ASSESSMENT: 72 year old female with  #1 stage I (T1 in 0) invasive ductal carcinoma she is status post lumpectomy followed by radiation now on tamoxifen since September 2011. Overall she seems to be tolerating it well. She understands that she will receive 5-10 years of therapy of tamoxifen.  #2 osteoarthritis and chronic pain. She has been on Celebrex she occasionally does take redness on as well.  #3 osteopenia we discussed getting a bone density scans. She'll receive prolia.     PLAN:   #1 continue tamoxifen 20 mg daily.  #2 I will see her back in 6 months time for followup.   All questions were answered. The patient knows to call the clinic with any problems, questions or concerns. We can certainly see the patient much sooner if necessary.  I spent 25 minutes counseling the patient face to face. The total time spent in the appointment was 30 minutes.    Drue Second, MD Medical/Oncology [facility] [phone] (beeper) [phone] (Office)

## 2013-10-03 ENCOUNTER — Telehealth: Payer: Self-pay | Admitting: Pulmonary Disease

## 2013-10-03 DIAGNOSIS — J189 Pneumonia, unspecified organism: Secondary | ICD-10-CM

## 2013-10-03 NOTE — Telephone Encounter (Signed)
Called and spoke with pt and she stated that she is scheduled to see RA on 1/5 and would like to come in earlier to have a cxr done prior to her appt.  Pt is aware that RA is not in the office today.  Will forward to RA and see if this will be ok.  RA please advise. Thanks  Allergies  Allergen Reactions  . Advair Diskus [Fluticasone-Salmeterol]     Nervous and shakey  . Aspirin     If she takes too much, it burns her stomach  . Bactrim [Sulfamethoxazole-Trimethoprim] Nausea Only  . Demerol Nausea Only     Current Outpatient Prescriptions on File Prior to Visit  Medication Sig Dispense Refill  . acetaminophen (TYLENOL) 500 MG tablet Take 500 mg by mouth every 6 (six) hours as needed for pain.       Marland Kitchen amLODipine (NORVASC) 10 MG tablet Take 1 tablet (10 mg total) by mouth daily.  30 tablet  3  . BIOTIN FORTE PO Take 1 tablet by mouth daily.      Marland Kitchen CALCIUM PO Take 1 tablet by mouth 2 (two) times daily.      . Cholecalciferol (VITAMIN D) 1000 UNITS capsule Take 1,000 Units by mouth daily.       . Cranberry 500 MG CAPS Take 2 capsules by mouth daily.       Marland Kitchen etodolac (LODINE) 500 MG tablet TAKE 1 TABLET (500 MG TOTAL) BY MOUTH 2 (TWO) TIMES DAILY.  30 tablet  3  . fish oil-omega-3 fatty acids 1000 MG capsule Take 2 g by mouth daily.       Marland Kitchen glucosamine-chondroitin 500-400 MG tablet Take 1 tablet by mouth daily.      . hydrochlorothiazide (HYDRODIURIL) 25 MG tablet Take 1 tablet (25 mg total) by mouth daily.  30 tablet  3  . levothyroxine (SYNTHROID, LEVOTHROID) 25 MCG tablet Take 1 tablet (25 mcg total) by mouth daily.  30 tablet  5  . loratadine (CLARITIN) 10 MG tablet Take 10 mg by mouth daily.        . Magnesium 100 MG CAPS Take 1 capsule by mouth 2 (two) times daily.       . meclizine (ANTIVERT) 25 MG tablet Take 25 mg by mouth 3 (three) times daily as needed for dizziness.       . Multiple Vitamin (MULTIVITAMIN) capsule Take 1 capsule by mouth daily.        . Polyvinyl Alcohol-Povidone  (CLEAR EYES ALL SEASONS OP) Apply 1 drop to eye daily as needed (dry eyes).      . pyridOXINE (VITAMIN B-6) 100 MG tablet Take 100 mg by mouth daily.        . ranitidine (ZANTAC) 150 MG tablet Take 150 mg by mouth daily.       . tamoxifen (NOLVADEX) 20 MG tablet Take 1 tablet (20 mg total) by mouth daily.  90 tablet  12  . vitamin E 400 UNIT capsule Take 400 Units by mouth daily.         No current facility-administered medications on file prior to visit.

## 2013-10-03 NOTE — Telephone Encounter (Signed)
cxr before appt OK

## 2013-10-06 NOTE — Telephone Encounter (Signed)
Pt aware and order placed.  

## 2013-10-16 ENCOUNTER — Encounter: Payer: Self-pay | Admitting: Adult Health

## 2013-10-16 ENCOUNTER — Ambulatory Visit (INDEPENDENT_AMBULATORY_CARE_PROVIDER_SITE_OTHER)
Admission: RE | Admit: 2013-10-16 | Discharge: 2013-10-16 | Disposition: A | Discharge status: Home / Self Care | Payer: Medicare Other | Source: Ambulatory Visit | Attending: Pulmonary Disease | Admitting: Pulmonary Disease

## 2013-10-16 ENCOUNTER — Ambulatory Visit (INDEPENDENT_AMBULATORY_CARE_PROVIDER_SITE_OTHER): Payer: Medicare Other | Admitting: Adult Health

## 2013-10-16 VITALS — BP 124/68 | HR 87 | Temp 97.6°F | Ht 61.0 in | Wt 191.4 lb

## 2013-10-16 DIAGNOSIS — J189 Pneumonia, unspecified organism: Secondary | ICD-10-CM

## 2013-10-16 MED ORDER — AMOXICILLIN-POT CLAVULANATE 875-125 MG PO TABS: 1.0000 | ORAL_TABLET | Freq: Two times a day (BID) | ORAL | Status: AC

## 2013-10-16 NOTE — Patient Instructions (Signed)
Use augmentin only for congestion, fever x 2 days, colored sputum Continue on GERD diet  follow up Dr. Elsworth Soho  In 6 months and As needed

## 2013-10-16 NOTE — Progress Notes (Signed)
  Subjective:    Patient ID: Joan Weaver, female    DOB: Nov 21, 1940, 73 y.o.   MRN: [id number]  HPI  Oncologist : Truddie Coco, Dr Lisbeth Renshaw   [name], never smoker for FU of episodes of pneumonitis in different lung zones - favor aspiration due to esophageal dysmotility Diagnosed with breast CA in July '11, underwent lumpectomy ([facility]) followed by RT , last nov'11.  She presented with pneumonia since Feb '12 not responding to antibiotics.  Transbronchial biopsy 3/12 c/w with organizing pna -neg for BOOP, complicated by pneumothorax  On steroids on & off since 3/12 -off x 5 wks 4/13  Serial CT chest 3/12, 10/12 showed Multiple shifting areas of consolidation in both lungs. Areas  of consolidation seen on the prior chest CT have resolved and there are new, similar appearing areas of predominately peripheral consolidation. 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 Severe esophageal dysmotility. As a result, a large amount of the ingested liquid remained in the esophagus throughout the examination, possibly placing the patient at risk for aspiration. Intermittent prominence of the cricopharyngeus muscle in the pharynx. No evidence of laryngeal penetration or tracheal aspiration with thin barium liquid.  EGD - Tortuous in the distal esophagus without evidence for stricture. Empiric dilation done- Barrett's, possible at the gastroesophageal junction   Dec '12 RUL ASD 06/07/12  Lt basal nodular area >Tapered off steroids   09/26/12  >Augmentin to have on hold     71m FU still weak, no energy,no  increased DOE, no prod cough . No flare in cough or discolored mucus.  No fever or  Hemoptysis.  IAppt from [facility] sch for 6/14 for esophageal dysmotility.  No n/v, or choking espiosdes since last  seen.   breathing is slight better but sitll gets SOB w/ exertion, occasional cough, occasional wheezing no chest tx.  CXR - RLL consolidation resolved, LLL persistent >>  10/16/2013 Follow up  9 month follow up - reports breathing is doing "pretty good", still having DOE.  had to fill augmentin x7days, during the holiday for prod cough with yellow mucus.  CXR today shows There is a stable nodular density along the left cardiac border  measuring up to 1.0 cm. This is unchanged since 09/28/2011. There is no evidence for airspace disease or pulmonary edema. Says she is doing well overall. No flare of GERD.  No fever, chest pain or wt loss.      Review of Systems neg for any significant sore throat, dysphagia, itching, sneezing, nasal congestion or excess/ purulent secretions, fever, chills, sweats, unintended wt loss, pleuritic or exertional cp, hempoptysis, orthopnea pnd or change in chronic leg swelling. Also denies presyncope, palpitations, heartburn, abdominal pain, nausea, vomiting, diarrhea or change in bowel or urinary habits, dysuria,hematuria, rash, arthralgias, visual complaints, headache, numbness weakness or ataxia.     Objective:   Physical Exam   Gen. Pleasant, well-nourished, in no distress ENT - no lesions, no post nasal drip Neck: No JVD, no thyromegaly, no carotid bruits Lungs: no use of accessory muscles, no dullness to percussion, clear without rales or rhonchi  Cardiovascular: Rhythm regular, heart sounds  normal, no murmurs or gallops, no peripheral edema Musculoskeletal: No deformities, no cyanosis or clubbing         Assessment & Plan:

## 2013-10-20 NOTE — Assessment & Plan Note (Addendum)
Doing well w/ stable cxr  Cont on GERD regimen .  Advised on aspiration precautions.   Plan Use augmentin only for congestion, fever x 2 days, colored sputum Continue on GERD diet  follow up Dr. Elsworth Soho  In 6 months and As needed

## 2013-10-30 ENCOUNTER — Other Ambulatory Visit: Payer: Self-pay | Admitting: Family Medicine

## 2013-10-31 ENCOUNTER — Other Ambulatory Visit: Payer: Self-pay | Admitting: Family Medicine

## 2013-10-31 NOTE — Telephone Encounter (Signed)
Medication refilled per protocol. 

## 2013-11-06 ENCOUNTER — Encounter: Payer: Self-pay | Admitting: Family Medicine

## 2013-11-06 ENCOUNTER — Ambulatory Visit (INDEPENDENT_AMBULATORY_CARE_PROVIDER_SITE_OTHER): Payer: Medicare Other | Admitting: Family Medicine

## 2013-11-06 VITALS — BP 106/68 | HR 100 | Temp 97.5°F | Resp 20 | Ht 61.0 in | Wt 187.0 lb

## 2013-11-06 DIAGNOSIS — Z23 Encounter for immunization: Secondary | ICD-10-CM

## 2013-11-06 DIAGNOSIS — N390 Urinary tract infection, site not specified: Secondary | ICD-10-CM

## 2013-11-06 DIAGNOSIS — M549 Dorsalgia, unspecified: Secondary | ICD-10-CM

## 2013-11-06 LAB — URINALYSIS, ROUTINE W REFLEX MICROSCOPIC
BILIRUBIN URINE: NEGATIVE
GLUCOSE, UA: NEGATIVE mg/dL
Ketones, ur: NEGATIVE mg/dL
Nitrite: NEGATIVE
Protein, ur: NEGATIVE mg/dL
Urobilinogen, UA: 0.2 mg/dL (ref 0.0–1.0)
pH: 6 (ref 5.0–8.0)

## 2013-11-06 LAB — URINALYSIS, MICROSCOPIC ONLY
CASTS: NONE SEEN
CRYSTALS: NONE SEEN

## 2013-11-06 MED ORDER — BUSPIRONE HCL 7.5 MG PO TABS: 7.5000 mg | ORAL_TABLET | Freq: Two times a day (BID) | ORAL | Status: DC

## 2013-11-06 MED ORDER — CEPHALEXIN 500 MG PO CAPS: 500.0000 mg | ORAL_CAPSULE | Freq: Three times a day (TID) | ORAL | Status: DC

## 2013-11-06 NOTE — Addendum Note (Signed)
Addended by: Shary Decamp B on: 11/06/2013 12:47 PM   Modules accepted: Orders

## 2013-11-06 NOTE — Progress Notes (Signed)
Subjective:    Patient ID: Joan Weaver, female    DOB: 19-May-1941, 73 y.o.   MRN: [id number]  HPI Patient believes she may have a urinary tract infection. She reports one week of urinary frequency, urgency, hesitancy, and dysuria. She also reports nausea. She denies any fevers. She reports subjective chills. She denies any vomiting. She denies any hematuria. She denies any low back pain. She is a history of frequent pneumonia. She is due for Prevnar 13. She also has a history of esophageal dysmotility. Specialists she saw at [facility] had recommended BuSpar.  She is now interested in trying that medication. Past Medical History  Diagnosis Date  . Thyroid disease   . Osteoporosis   . Hernia   . Cataract   . Ulcer   . Urinary incontinence   . Hypertension     no meds x 3 yrs  . Heart murmur     slight murmur -never had any problems  . Hypothyroidism   . 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 H/O bladder infections   . GERD (gastroesophageal reflux disease)     minor - tx with zantac  . Headache(784.0)     otc med prn  . Breast cancer August 2011    left - lumpectomy  . Arthritis     knees, hips   Current Outpatient Prescriptions on File Prior to Visit  Medication Sig Dispense Refill  . acetaminophen (TYLENOL) 500 MG tablet Take 500 mg by mouth every 6 (six) hours as needed for pain.       Marland Kitchen amLODipine (NORVASC) 10 MG tablet TAKE 1 TABLET BY MOUTH DAILY  30 tablet  11  . BIOTIN FORTE PO Take 1 tablet by mouth daily.      Marland Kitchen CALCIUM PO Take 1 tablet by mouth daily.       . Cholecalciferol (VITAMIN D) 1000 UNITS capsule Take 1,000 Units by mouth daily.       . Cranberry 500 MG CAPS Take 2 capsules by mouth daily.       Marland Kitchen etodolac (LODINE) 500 MG tablet TAKE 1 TABLET BY MOUTH TWICE A DAY  30 tablet  3  . fish oil-omega-3 fatty acids 1000 MG capsule Take 2 g by mouth daily.       Marland Kitchen glucosamine-chondroitin 500-400 MG tablet Take 1  tablet by mouth daily.      . hydrochlorothiazide (HYDRODIURIL) 25 MG tablet Take 1 tablet (25 mg total) by mouth daily.  30 tablet  3  . levothyroxine (SYNTHROID, LEVOTHROID) 25 MCG tablet Take 1 tablet (25 mcg total) by mouth daily.  30 tablet  5  . loratadine (CLARITIN) 10 MG tablet Take 10 mg by mouth daily.        . Magnesium 100 MG CAPS Take 1 capsule by mouth 2 (two) times daily.       . meclizine (ANTIVERT) 25 MG tablet Take 25 mg by mouth 3 (three) times daily as needed for dizziness.       . Multiple Vitamin (MULTIVITAMIN) capsule Take 1 capsule by mouth daily.        . Polyvinyl Alcohol-Povidone (CLEAR EYES ALL SEASONS OP) Apply 1 drop to eye daily as needed (dry eyes).      . pyridOXINE (VITAMIN B-6) 100 MG tablet Take 100 mg by mouth daily.        . ranitidine (ZANTAC) 150 MG tablet Take 150 mg  by mouth daily.       . tamoxifen (NOLVADEX) 20 MG tablet Take 1 tablet (20 mg total) by mouth daily.  90 tablet  12  . vitamin E 400 UNIT capsule Take 400 Units by mouth daily.         No current facility-administered medications on file prior to visit.   Allergies  Allergen Reactions  . Advair Diskus [Fluticasone-Salmeterol]     Nervous and shakey  . Aspirin     If she takes too much, it burns her stomach  . Bactrim [Sulfamethoxazole-Trimethoprim] Nausea Only  . Demerol Nausea Only   History   Social History  . Marital Status: Widowed    Spouse Name: N/A    Number of Children: 0  . Years of Education: N/A   Occupational History  . Retired      Personal assistant   Social History Main Topics  . Smoking status: Never Smoker   . Smokeless tobacco: Never Used  . Alcohol Use: No  . Drug Use: No  . Sexual Activity: Not Currently    Birth Control/ Protection: Post-menopausal   Other Topics Concern  . Not on file   Social History Narrative  . No narrative on file      Review of Systems  All other systems reviewed and are negative.       Objective:   Physical Exam    Vitals reviewed. Neck: Neck supple.  Cardiovascular: Normal rate, regular rhythm and normal heart sounds.  Exam reveals no gallop and no friction rub.   No murmur heard. Pulmonary/Chest: Effort normal and breath sounds normal. No respiratory distress. She has no wheezes. She has no rales.  Abdominal: Soft. Bowel sounds are normal. She exhibits no distension. There is no tenderness. There is no rebound and no guarding.  Lymphadenopathy:    She has no cervical adenopathy.          Assessment & Plan:  Back pain - Plan: Urinalysis, Routine w reflex microscopic  UTI (urinary tract infection) - Plan: cephALEXin (KEFLEX) 500 MG capsule  urinalysis shows leukocyte esterase.  Start the patient on Keflex 500 mg by mouth 3 times a day for 7 days. The quantity of urine provided was insufficient to run a urine culture. Also gave patient Prevnar 13 today in the office. Also gave the patient BuSpar 7.5 mg by mouth twice a day for esophageal dysmotility.

## 2013-11-13 ENCOUNTER — Other Ambulatory Visit: Payer: Self-pay | Admitting: Family Medicine

## 2013-11-27 ENCOUNTER — Other Ambulatory Visit: Payer: Self-pay | Admitting: Family Medicine

## 2013-11-27 MED ORDER — HYDROCHLOROTHIAZIDE 25 MG PO TABS
25.0000 mg | ORAL_TABLET | Freq: Every day | ORAL | Status: DC
Start: 2013-11-27 — End: 2014-11-10

## 2013-11-27 NOTE — Telephone Encounter (Signed)
Rx Refilled  

## 2014-02-16 ENCOUNTER — Telehealth: Payer: Self-pay

## 2014-02-16 NOTE — Telephone Encounter (Signed)
[facility] - KK out on LOA.  Reschedule to 5/14 lab 245, APP 315.  Pt to call clinic if need to reschedule.

## 2014-02-19 ENCOUNTER — Other Ambulatory Visit (HOSPITAL_BASED_OUTPATIENT_CLINIC_OR_DEPARTMENT_OTHER): Payer: Medicare Other

## 2014-02-19 ENCOUNTER — Encounter: Payer: Self-pay | Admitting: Oncology

## 2014-02-19 ENCOUNTER — Other Ambulatory Visit: Payer: Self-pay | Admitting: Family Medicine

## 2014-02-19 ENCOUNTER — Telehealth: Payer: Self-pay | Admitting: Internal Medicine

## 2014-02-19 ENCOUNTER — Encounter: Payer: Self-pay | Admitting: Family Medicine

## 2014-02-19 ENCOUNTER — Ambulatory Visit (HOSPITAL_BASED_OUTPATIENT_CLINIC_OR_DEPARTMENT_OTHER): Payer: Medicare Other | Admitting: Oncology

## 2014-02-19 VITALS — BP 140/81 | HR 94 | Temp 98.7°F | Resp 18 | Ht 61.0 in | Wt 193.0 lb

## 2014-02-19 DIAGNOSIS — C50912 Malignant neoplasm of unspecified site of left female breast: Secondary | ICD-10-CM | POA: Insufficient documentation

## 2014-02-19 DIAGNOSIS — G8929 Other chronic pain: Secondary | ICD-10-CM

## 2014-02-19 DIAGNOSIS — M949 Disorder of cartilage, unspecified: Secondary | ICD-10-CM

## 2014-02-19 DIAGNOSIS — M1990 Unspecified osteoarthritis, unspecified site: Secondary | ICD-10-CM

## 2014-02-19 DIAGNOSIS — Z170 Estrogen receptor positive status [ER+]: Secondary | ICD-10-CM

## 2014-02-19 DIAGNOSIS — M899 Disorder of bone, unspecified: Secondary | ICD-10-CM

## 2014-02-19 DIAGNOSIS — C50919 Malignant neoplasm of unspecified site of unspecified female breast: Secondary | ICD-10-CM

## 2014-02-19 LAB — COMPREHENSIVE METABOLIC PANEL (CC13)
ALBUMIN: 3.4 g/dL — AB (ref 3.5–5.0)
ALK PHOS: 52 U/L (ref 40–150)
ALT: 16 U/L (ref 0–55)
AST: 21 U/L (ref 5–34)
Anion Gap: 13 mEq/L — ABNORMAL HIGH (ref 3–11)
BUN: 22 mg/dL (ref 7.0–26.0)
CO2: 25 mEq/L (ref 22–29)
Calcium: 9.7 mg/dL (ref 8.4–10.4)
Chloride: 104 mEq/L (ref 98–109)
Creatinine: 1.1 mg/dL (ref 0.6–1.1)
Glucose: 104 mg/dl (ref 70–140)
POTASSIUM: 4 meq/L (ref 3.5–5.1)
SODIUM: 141 meq/L (ref 136–145)
TOTAL PROTEIN: 6.8 g/dL (ref 6.4–8.3)
Total Bilirubin: 0.34 mg/dL (ref 0.20–1.20)

## 2014-02-19 LAB — CBC WITH DIFFERENTIAL/PLATELET
BASO%: 1.5 % (ref 0.0–2.0)
Basophils Absolute: 0.1 10*3/uL (ref 0.0–0.1)
EOS%: 2.8 % (ref 0.0–7.0)
Eosinophils Absolute: 0.1 10*3/uL (ref 0.0–0.5)
HCT: 40.5 % (ref 34.8–46.6)
HGB: 13.2 g/dL (ref 11.6–15.9)
LYMPH%: 36.9 % (ref 14.0–49.7)
MCH: 29.1 pg (ref 25.1–34.0)
MCHC: 32.6 g/dL (ref 31.5–36.0)
MCV: 89.2 fL (ref 79.5–101.0)
MONO#: 0.5 10*3/uL (ref 0.1–0.9)
MONO%: 12 % (ref 0.0–14.0)
NEUT#: 2.1 10*3/uL (ref 1.5–6.5)
NEUT%: 46.8 % (ref 38.4–76.8)
PLATELETS: 200 10*3/uL (ref 145–400)
RBC: 4.54 10*6/uL (ref 3.70–5.45)
RDW: 13.8 % (ref 11.2–14.5)
WBC: 4.5 10*3/uL (ref 3.9–10.3)
lymph#: 1.7 10*3/uL (ref 0.9–3.3)

## 2014-02-19 NOTE — Telephone Encounter (Signed)
gv pt appt schedule for nov and mammo for 04/27/14 @ [facility] - s/w linda. KK pt scheduled w/dr gudena in nov. no return date for KK at this time.

## 2014-02-19 NOTE — Progress Notes (Signed)
OFFICE PROGRESS NOTE  CC  Joan Fraction, MD [address] [place] [address] Dr. Kyung Rudd Dr. 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  STAGE: Left breast at the 9:00 position T1 N0 (stage I) ER +98% PR +97% HER-2/neu negative, Ki-67 18% Invasive ductal carcinoma  PRIOR THERAPY:  #1 patient presented at the age of 48 for a screening mammogram in 2011 that showed a suspicious mass in the left 9:00 position. Biopsy showed invasive ductal carcinoma HER-2/neu negative ER positive PR positive respectively at 98 and 97% proliferation marker Ki-67 18%. She had a MRI that showed a solitary enhancing mass in the medial aspect of the left breast measuring 8 x 6 x 10 mm.  #2 05/11/2010 patient underwent a lumpectomy that showed invasive ductal carcinoma measuring 6.0 mm, grade 2, no angiolymphatic invasion. All resection margins were negative. 2 sentinel nodes were negative for metastatic disease. Tumor was strongly ER +98% PR +97%.  #3 patient subsequently received radiation therapy by Dr. Kyung Rudd which she completed in August 2011.  #4 she was then begun on tamoxifen 20 mg daily starting in September 2011. Patient also had osteopenia so she was also placed on Zometa.   CURRENT THERAPY: Tamoxifen 20 mg daily  INTERVAL HISTORY: Joan Weaver 73 y.o. female returns for followup visit. Overall she's doing well she is still having some aches and pains in her lower extremities from arthritis. This is been ongoing. She denies any fevers chills night sweats headaches shortness of breath. She's had some history of recurrent pneumonias, but no pneumonia this past winter. She has occasional hot flashes. She has no vaginal discharge no bleeding no lower extremity swelling. Remainder of the 10 point review of systems is negative.  MEDICAL HISTORY: Past Medical History  Diagnosis Date   . Thyroid disease   . Osteoporosis   . Hernia   . Cataract   . Ulcer   . Urinary incontinence   . Hypertension     no meds x 3 yrs  . Heart murmur     slight murmur -never had any problems  . Hypothyroidism   . 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 H/O bladder infections   . GERD (gastroesophageal reflux disease)     minor - tx with zantac  . Headache(784.0)     otc med prn  . Breast cancer August 2011    left - lumpectomy  . Arthritis     knees, hips    ALLERGIES:  is allergic to advair diskus; aspirin; bactrim; and demerol.  MEDICATIONS:  Current Outpatient Prescriptions  Medication Sig Dispense Refill  . acetaminophen (TYLENOL) 500 MG tablet Take 500 mg by mouth every 6 (six) hours as needed for pain.       Marland Kitchen amLODipine (NORVASC) 10 MG tablet TAKE 1 TABLET BY MOUTH DAILY  30 tablet  11  . BIOTIN FORTE PO Take 1 tablet by mouth daily.      Marland Kitchen CALCIUM PO Take 1 tablet by mouth daily.       . Cholecalciferol (VITAMIN D) 1000 UNITS capsule Take 1,000 Units by mouth daily.       . Cranberry 500 MG CAPS Take 2 capsules by mouth daily.       Marland Kitchen etodolac (LODINE) 500 MG tablet TAKE 1 TABLET BY MOUTH TWICE A DAY  30 tablet  3  . fish oil-omega-3 fatty acids 1000 MG capsule Take 2 g by mouth daily.       Marland Kitchen glucosamine-chondroitin 500-400 MG tablet Take 1 tablet by mouth daily.      . hydrochlorothiazide (HYDRODIURIL) 25 MG tablet Take 1 tablet (25 mg total) by mouth daily.  30 tablet  11  . levothyroxine (SYNTHROID, LEVOTHROID) 25 MCG tablet TAKE 1 TABLET (25 MCG TOTAL) BY MOUTH DAILY.  30 tablet  5  . loratadine (CLARITIN) 10 MG tablet Take 10 mg by mouth daily.        . Magnesium 100 MG CAPS Take 1 capsule by mouth 2 (two) times daily.       . meclizine (ANTIVERT) 25 MG tablet Take 25 mg by mouth 3 (three) times daily as needed for dizziness.       . Multiple Vitamin (MULTIVITAMIN) capsule Take 1 capsule by mouth daily.        .  Polyvinyl Alcohol-Povidone (CLEAR EYES ALL SEASONS OP) Apply 1 drop to eye daily as needed (dry eyes).      . pyridOXINE (VITAMIN B-6) 100 MG tablet Take 100 mg by mouth daily.        . ranitidine (ZANTAC) 150 MG tablet Take 150 mg by mouth daily.       . tamoxifen (NOLVADEX) 20 MG tablet Take 1 tablet (20 mg total) by mouth daily.  90 tablet  12  . busPIRone (BUSPAR) 7.5 MG tablet Take 1 tablet (7.5 mg total) by mouth 2 (two) times daily.  60 tablet  3  . cephALEXin (KEFLEX) 500 MG capsule Take 1 capsule (500 mg total) by mouth 3 (three) times daily.  21 capsule  1  . vitamin E 400 UNIT capsule Take 400 Units by mouth daily.         No current facility-administered medications for this visit.    SURGICAL HISTORY:  Past Surgical History  Procedure Laterality Date  . Hiatal hernia repair      x 2  . Tonsillectomy    . Appendectomy    . Left cataract surgery    . Breast lumpectomy      left  . Knee surgery      Arthroscopic  . Eye surgery      left- cataract   . Hysteroscopy w/d&c N/A 07/07/2013    Procedure: DILATATION AND CURETTAGE /HYSTEROSCOPY;  Surgeon: Anastasio Auerbach, MD;  Location: [facility];  Service: Gynecology;  Laterality: N/A;    REVIEW OF SYSTEMS:  Pertinent items are noted in HPI.   HEALTH MAINTENANCE:   PHYSICAL EXAMINATION: Blood pressure 140/81, pulse 94, temperature 98.7 F (37.1 C), temperature source Oral, resp. rate 18, height _0  (1.549 m), weight 193 lb (87.544 kg), SpO2 99.00%. Body mass index is 36.49 kg/(m^2). ECOG PERFORMANCE STATUS: 0 - Asymptomatic   Well-developed nourished female in no acute distress HEENT exam EOMI PERRLA oral mucosa is moist neck is supple lungs clear to auscultation cardiovascular regular rate rhythm abdomen soft nontender extremities trace edema neuro is nonfocal breast exam left breast reveals well-healed surgical scar no nodularity no erythema no evidence of local recurrence right breast no masses or nipple  discharge.   LABORATORY DATA: Lab Results  Component Value Date   WBC 4.5 02/19/2014   HGB 13.2 02/19/2014   HCT 40.5 02/19/2014   MCV 89.2 02/19/2014   PLT 200 02/19/2014      Chemistry      Component Value Date/Time  NA 141 08/21/2013 1139   NA 142 06/27/2013 1240   K 3.6 08/21/2013 1139   K 4.5 06/27/2013 1240   CL 103 06/27/2013 1240   CL 108* 07/30/2012 1318   CO2 24 08/21/2013 1139   CO2 29 06/27/2013 1240   BUN 20.0 08/21/2013 1139   BUN 18 06/27/2013 1240   CREATININE 1.0 08/21/2013 1139   CREATININE 0.74 06/27/2013 1240   CREATININE 0.87 06/11/2013 1128      Component Value Date/Time   CALCIUM 9.6 08/21/2013 1139   CALCIUM 10.1 06/27/2013 1240   ALKPHOS 60 08/21/2013 1139   ALKPHOS 57 06/27/2013 1240   AST 22 08/21/2013 1139   AST 21 06/27/2013 1240   ALT 15 08/21/2013 1139   ALT 15 06/27/2013 1240   BILITOT 0.43 08/21/2013 1139   BILITOT 0.4 06/27/2013 1240       RADIOGRAPHIC STUDIES:  No results found.  ASSESSMENT: 73 year old female with  #1 stage I (T1 in 0) invasive ductal carcinoma she is status post lumpectomy followed by radiation now on tamoxifen since September 2011. Overall she seems to be tolerating it well. She understands that she will receive 5-10 years of therapy of tamoxifen.  #2 osteoarthritis and chronic pain. She has been on Celebrex she occasionally does take redness on as well.  #3 osteopenia. Remains on Calcium and Vitamin D.     PLAN:   #1 continue tamoxifen 20 mg daily.  #2 I will see her back in 6 months time for followup.   All questions were answered. The patient knows to call the clinic with any problems, questions or concerns. We can certainly see the patient much sooner if necessary.  I spent 15 minutes counseling the patient face to face. The total time spent in the appointment was 20 minutes.   Mikey Bussing, DNP, AGPCNP-BC

## 2014-02-20 NOTE — Telephone Encounter (Signed)
Error

## 2014-02-23 ENCOUNTER — Ambulatory Visit: Payer: Medicare Other | Admitting: Oncology

## 2014-02-23 ENCOUNTER — Other Ambulatory Visit: Payer: Medicare Other

## 2014-02-26 ENCOUNTER — Other Ambulatory Visit: Payer: Self-pay | Admitting: Oncology

## 2014-02-26 DIAGNOSIS — C50919 Malignant neoplasm of unspecified site of unspecified female breast: Secondary | ICD-10-CM

## 2014-03-12 ENCOUNTER — Other Ambulatory Visit: Payer: Self-pay | Admitting: Family Medicine

## 2014-04-10 ENCOUNTER — Encounter (HOSPITAL_COMMUNITY): Payer: Self-pay | Admitting: Emergency Medicine

## 2014-04-10 ENCOUNTER — Emergency Department (HOSPITAL_COMMUNITY)
Admission: EM | Admit: 2014-04-10 | Discharge: 2014-04-10 | Disposition: A | Discharge status: Home / Self Care | Payer: Medicare Other | Source: Home / Self Care | Attending: Family Medicine | Admitting: Family Medicine

## 2014-04-10 DIAGNOSIS — N390 Urinary tract infection, site not specified: Secondary | ICD-10-CM

## 2014-04-10 LAB — POCT URINALYSIS DIP (DEVICE)
Glucose, UA: NEGATIVE mg/dL
HGB URINE DIPSTICK: NEGATIVE
Ketones, ur: NEGATIVE mg/dL
Nitrite: NEGATIVE
PH: 7 (ref 5.0–8.0)
Protein, ur: NEGATIVE mg/dL
Specific Gravity, Urine: 1.015 (ref 1.005–1.030)
UROBILINOGEN UA: 0.2 mg/dL (ref 0.0–1.0)

## 2014-04-10 MED ORDER — CEPHALEXIN 500 MG PO CAPS: 500.0000 mg | ORAL_CAPSULE | Freq: Three times a day (TID) | ORAL | Status: DC

## 2014-04-10 NOTE — Discharge Instructions (Signed)
Thank you for coming in today. If your belly pain worsens, or you have high fever, bad vomiting, blood in your stool or black tarry stool go to the Emergency Room.   Urinary Tract Infection Urinary tract infections (UTIs) can develop anywhere along your urinary tract. Your urinary tract is your body's drainage system for removing wastes and extra water. Your urinary tract includes two kidneys, two ureters, a bladder, and a urethra. Your kidneys are a pair of bean-shaped organs. Each kidney is about the size of your fist. They are located below your ribs, one on each side of your spine. CAUSES Infections are caused by microbes, which are microscopic organisms, including fungi, viruses, and bacteria. These organisms are so small that they can only be seen through a microscope. Bacteria are the microbes that most commonly cause UTIs. SYMPTOMS  Symptoms of UTIs may vary by age and gender of the patient and by the location of the infection. Symptoms in young women typically include a frequent and intense urge to urinate and a painful, burning feeling in the bladder or urethra during urination. Older women and men are more likely to be tired, shaky, and weak and have muscle aches and abdominal pain. A fever may mean the infection is in your kidneys. Other symptoms of a kidney infection include pain in your back or sides below the ribs, nausea, and vomiting. DIAGNOSIS To diagnose a UTI, your caregiver will ask you about your symptoms. Your caregiver also will ask to provide a urine sample. The urine sample will be tested for bacteria and white blood cells. White blood cells are made by your body to help fight infection. TREATMENT  Typically, UTIs can be treated with medication. Because most UTIs are caused by a bacterial infection, they usually can be treated with the use of antibiotics. The choice of antibiotic and length of treatment depend on your symptoms and the type of bacteria causing your  infection. HOME CARE INSTRUCTIONS  If you were prescribed antibiotics, take them exactly as your caregiver instructs you. Finish the medication even if you feel better after you have only taken some of the medication.  Drink enough water and fluids to keep your urine clear or pale yellow.  Avoid caffeine, tea, and carbonated beverages. They tend to irritate your bladder.  Empty your bladder often. Avoid holding urine for long periods of time.  Empty your bladder before and after sexual intercourse.  After a bowel movement, women should cleanse from front to back. Use each tissue only once. SEEK MEDICAL CARE IF:   You have back pain.  You develop a fever.  Your symptoms do not begin to resolve within 3 days. SEEK IMMEDIATE MEDICAL CARE IF:   You have severe back pain or lower abdominal pain.  You develop chills.  You have nausea or vomiting.  You have continued burning or discomfort with urination. MAKE SURE YOU:   Understand these instructions.  Will watch your condition.  Will get help right away if you are not doing well or get worse. Document Released: 07/05/2005 Document Revised: 03/26/2012 Document Reviewed: 11/03/2011 Exit[employer] Patient Information 2015 [employer]. This information is not intended to replace advice given to you by your health care provider. Make sure you discuss any questions you have with your health care provider.  

## 2014-04-10 NOTE — ED Provider Notes (Signed)
Joan Weaver is a 73 y.o. female who presents to Urgent Care today for UTI. Patient has a 40 history of urinary frequency urgency and dysuria. No fevers or chills vomiting or diarrhea. Nausea is present. No chest pain palpitations or shortness of breath. Patient has used AZO which helps some.   Past Medical History  Diagnosis Date  . Thyroid disease   . Osteoporosis   . Hernia   . Cataract   . Ulcer   . Urinary incontinence   . Hypertension     no meds x 3 yrs  . Heart murmur     slight murmur -never had any problems  . Hypothyroidism   . 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 H/O bladder infections   . GERD (gastroesophageal reflux disease)     minor - tx with zantac  . Headache(784.0)     otc med prn  . Breast cancer August 2011    left - lumpectomy  . Arthritis     knees, hips   History  Substance Use Topics  . Smoking status: Never Smoker   . Smokeless tobacco: Never Used  . Alcohol Use: No   ROS as above Medications: No current facility-administered medications for this encounter.   Current Outpatient Prescriptions  Medication Sig Dispense Refill  . acetaminophen (TYLENOL) 500 MG tablet Take 500 mg by mouth every 6 (six) hours as needed for pain.       Marland Kitchen amLODipine (NORVASC) 10 MG tablet TAKE 1 TABLET BY MOUTH DAILY  30 tablet  11  . BIOTIN FORTE PO Take 1 tablet by mouth daily.      . busPIRone (BUSPAR) 7.5 MG tablet Take 1 tablet (7.5 mg total) by mouth 2 (two) times daily.  60 tablet  3  . CALCIUM PO Take 1 tablet by mouth daily.       . cephALEXin (KEFLEX) 500 MG capsule Take 1 capsule (500 mg total) by mouth 3 (three) times daily.  21 capsule  1  . cephALEXin (KEFLEX) 500 MG capsule Take 1 capsule (500 mg total) by mouth 3 (three) times daily.  21 capsule  0  . Cholecalciferol (VITAMIN D) 1000 UNITS capsule Take 1,000 Units by mouth daily.       . Cranberry 500 MG CAPS Take 2 capsules by mouth daily.       Marland Kitchen etodolac  (LODINE) 500 MG tablet TAKE 1 TABLET BY MOUTH TWICE A DAY  30 tablet  3  . fish oil-omega-3 fatty acids 1000 MG capsule Take 2 g by mouth daily.       Marland Kitchen glucosamine-chondroitin 500-400 MG tablet Take 1 tablet by mouth daily.      . hydrochlorothiazide (HYDRODIURIL) 25 MG tablet Take 1 tablet (25 mg total) by mouth daily.  30 tablet  11  . levothyroxine (SYNTHROID, LEVOTHROID) 25 MCG tablet TAKE 1 TABLET (25 MCG TOTAL) BY MOUTH DAILY.  30 tablet  5  . loratadine (CLARITIN) 10 MG tablet Take 10 mg by mouth daily.        . Magnesium 100 MG CAPS Take 1 capsule by mouth 2 (two) times daily.       . meclizine (ANTIVERT) 25 MG tablet Take 25 mg by mouth 3 (three) times daily as needed for dizziness.       . Multiple Vitamin (MULTIVITAMIN) capsule Take 1 capsule by mouth daily.        . Polyvinyl  Alcohol-Povidone (CLEAR EYES ALL SEASONS OP) Apply 1 drop to eye daily as needed (dry eyes).      . pyridOXINE (VITAMIN B-6) 100 MG tablet Take 100 mg by mouth daily.        . ranitidine (ZANTAC) 150 MG tablet Take 150 mg by mouth daily.       . tamoxifen (NOLVADEX) 20 MG tablet TAKE 1 TABLET EVERY DAY  90 tablet  1  . vitamin E 400 UNIT capsule Take 400 Units by mouth daily.          Exam:  BP 152/73  Pulse 83  Temp(Src) 98.6 F (37 C) (Oral)  Resp 16  SpO2 100% Gen: Well NAD HEENT: EOMI,  MMM Lungs: Normal work of breathing. CTABL Heart: RRR no MRG Abd: NABS, Soft. NT, ND no CV angle tenderness to percussion Exts: Brisk capillary refill, warm and well perfused.   Results for orders placed during the hospital encounter of 04/10/14 (from the past 24 hour(s))  POCT URINALYSIS DIP (DEVICE)     Status: Abnormal   Collection Time    04/10/14  4:55 PM      Result Value Ref Range   Glucose, UA NEGATIVE  NEGATIVE mg/dL   Bilirubin Urine LARGE (*) NEGATIVE   Ketones, ur NEGATIVE  NEGATIVE mg/dL   Specific Gravity, Urine 1.015  1.005 - 1.030   Hgb urine dipstick NEGATIVE  NEGATIVE   pH 7.0  5.0 - 8.0    Protein, ur NEGATIVE  NEGATIVE mg/dL   Urobilinogen, UA 0.2  0.0 - 1.0 mg/dL   Nitrite NEGATIVE  NEGATIVE   Leukocytes, UA MODERATE (*) NEGATIVE   No results found.  Assessment and Plan: 73 y.o. female with urinary tract infection. Plan to treat empirically with Keflex antibiotics.  Discussed warning signs or symptoms. Please see discharge instructions. Patient expresses understanding.    Gregor Hams, MD 04/10/14 [phone]

## 2014-04-10 NOTE — ED Notes (Signed)
Pt  Reports  Symptoms  Of urinary frequency  And  Burning  On   Urination  With low  Bladder  Pain  X    4  Days

## 2014-05-06 ENCOUNTER — Ambulatory Visit (INDEPENDENT_AMBULATORY_CARE_PROVIDER_SITE_OTHER): Payer: Medicare Other | Admitting: Physician Assistant

## 2014-05-06 ENCOUNTER — Encounter: Payer: Self-pay | Admitting: Physician Assistant

## 2014-05-06 VITALS — BP 122/74 | HR 96 | Temp 99.2°F | Resp 18 | Ht 61.0 in | Wt 192.0 lb

## 2014-05-06 DIAGNOSIS — N3000 Acute cystitis without hematuria: Secondary | ICD-10-CM

## 2014-05-06 DIAGNOSIS — R300 Dysuria: Secondary | ICD-10-CM

## 2014-05-06 LAB — URINALYSIS, ROUTINE W REFLEX MICROSCOPIC
Glucose, UA: NEGATIVE mg/dL
Hgb urine dipstick: NEGATIVE
Ketones, ur: NEGATIVE mg/dL
Nitrite: NEGATIVE
Protein, ur: NEGATIVE mg/dL
SPECIFIC GRAVITY, URINE: 1.01 (ref 1.005–1.030)
Urobilinogen, UA: 0.2 mg/dL (ref 0.0–1.0)
pH: 6.5 (ref 5.0–8.0)

## 2014-05-06 LAB — URINALYSIS, MICROSCOPIC ONLY
CRYSTALS: NONE SEEN
Casts: NONE SEEN
RBC / HPF: NONE SEEN RBC/hpf (ref <3)

## 2014-05-06 MED ORDER — CIPROFLOXACIN HCL 500 MG PO TABS: 500.0000 mg | ORAL_TABLET | Freq: Two times a day (BID) | ORAL | Status: DC

## 2014-05-06 NOTE — Progress Notes (Signed)
Patient ID: Joan Weaver MRN: [id number], DOB: Mar 01, 1941, 73 y.o. Date of Encounter: 05/06/2014, 5:02 PM    Chief Complaint:  Chief Complaint  Patient presents with  . ?UTI - Frequency, burning with urination and nausea     HPI: 73 y.o. year old white female was seen and treated at the urgent care on 04/10/14 for UTI. Treated for UTI and treated with Keflex. Patient says that the Keflex helped some but she continued to have some pressure ever since then. Says that the burning sensation has come back over the past 2-3 days and now she is having nausea again as well. She says that when she urinates a little bit will come out and she sits and a little more will come out. She has had no fevers or chills. No back pain. Asked her she has frequent UTIs and she says about 2 per year.     Home Meds:   Outpatient Prescriptions Prior to Visit  Medication Sig Dispense Refill  . acetaminophen (TYLENOL) 500 MG tablet Take 500 mg by mouth every 6 (six) hours as needed for pain.       Marland Kitchen amLODipine (NORVASC) 10 MG tablet TAKE 1 TABLET BY MOUTH DAILY  30 tablet  11  . BIOTIN FORTE PO Take 1 tablet by mouth daily.      . busPIRone (BUSPAR) 7.5 MG tablet Take 1 tablet (7.5 mg total) by mouth 2 (two) times daily.  60 tablet  3  . CALCIUM PO Take 1 tablet by mouth daily.       . Cholecalciferol (VITAMIN D) 1000 UNITS capsule Take 1,000 Units by mouth daily.       . Cranberry 500 MG CAPS Take 2 capsules by mouth daily.       Marland Kitchen etodolac (LODINE) 500 MG tablet TAKE 1 TABLET BY MOUTH TWICE A DAY  30 tablet  3  . fish oil-omega-3 fatty acids 1000 MG capsule Take 2 g by mouth daily.       Marland Kitchen glucosamine-chondroitin 500-400 MG tablet Take 1 tablet by mouth daily.      . hydrochlorothiazide (HYDRODIURIL) 25 MG tablet Take 1 tablet (25 mg total) by mouth daily.  30 tablet  11  . levothyroxine (SYNTHROID, LEVOTHROID) 25 MCG tablet TAKE 1 TABLET (25 MCG TOTAL) BY MOUTH DAILY.  30 tablet  5  . loratadine  (CLARITIN) 10 MG tablet Take 10 mg by mouth daily.        . Magnesium 100 MG CAPS Take 1 capsule by mouth 2 (two) times daily.       . meclizine (ANTIVERT) 25 MG tablet Take 25 mg by mouth 3 (three) times daily as needed for dizziness.       . Multiple Vitamin (MULTIVITAMIN) capsule Take 1 capsule by mouth daily.        . Polyvinyl Alcohol-Povidone (CLEAR EYES ALL SEASONS OP) Apply 1 drop to eye daily as needed (dry eyes).      . pyridOXINE (VITAMIN B-6) 100 MG tablet Take 100 mg by mouth daily.        . ranitidine (ZANTAC) 150 MG tablet Take 150 mg by mouth daily.       . tamoxifen (NOLVADEX) 20 MG tablet TAKE 1 TABLET EVERY DAY  90 tablet  1  . vitamin E 400 UNIT capsule Take 400 Units by mouth daily.        . cephALEXin (KEFLEX) 500 MG capsule Take 1 capsule (500 mg total) by mouth  3 (three) times daily.  21 capsule  0  . cephALEXin (KEFLEX) 500 MG capsule Take 1 capsule (500 mg total) by mouth 3 (three) times daily.  21 capsule  1   No facility-administered medications prior to visit.    Allergies:  Allergies  Allergen Reactions  . Advair Diskus [Fluticasone-Salmeterol]     Nervous and shakey  . Aspirin     If she takes too much, it burns her stomach  . Bactrim [Sulfamethoxazole-Trimethoprim] Nausea Only  . Demerol Nausea Only      Review of Systems: See HPI for pertinent ROS. All other ROS negative.    Physical Exam: Blood pressure 122/74, pulse 96, temperature 99.2 F (37.3 C), temperature source Oral, resp. rate 18, height 5\' 1"  (1.549 m), weight 192 lb (87.091 kg)., Body mass index is 36.3 kg/(m^2). General:  Mild obese WF. Appears in no acute distress. Neck: Supple. No thyromegaly. No lymphadenopathy. Lungs: Clear bilaterally to auscultation without wheezes, rales, or rhonchi. Breathing is unlabored. Heart: Regular rhythm. No murmurs, rubs, or gallops. Abdomen: Soft, non-tender, non-distended with normoactive bowel sounds. No hepatomegaly. No rebound/guarding. No obvious  abdominal masses. No tenderness with palpation even in the suprapubic area. Msk:  Strength and tone normal for age. No costophrenic angle tenderness bilaterally with percussion. Extremities/Skin: Warm and dry. Neuro: Alert and oriented X 3. Moves all extremities spontaneously. Gait is normal. CNII-XII grossly in tact. Psych:  Responds to questions appropriately with a normal affect.     ASSESSMENT AND PLAN:  73 y.o. year old female with  1. Acute cystitis without hematuria - ciprofloxacin (CIPRO) 500 MG tablet; Take 1 tablet (500 mg total) by mouth 2 (two) times daily.  Dispense: 14 tablet; Refill: 0  2. Burning with urination - Urinalysis, Routine w reflex microscopic - Urine culture  Will send culture. She is to go ahead and start the Cipro and make sure to complete all of it. She is to follow up if symptoms worsen or do not resolve with completion of Cipro. As well, we will be in touch with her once we get the culture results.   Marin Olp [place], [place], [facility] 05/06/2014 5:02 PM

## 2014-05-09 LAB — URINE CULTURE: Colony Count: >=100000

## 2014-05-26 ENCOUNTER — Encounter: Payer: Self-pay | Admitting: Family Medicine

## 2014-05-26 ENCOUNTER — Ambulatory Visit (INDEPENDENT_AMBULATORY_CARE_PROVIDER_SITE_OTHER): Payer: Medicare Other | Admitting: Family Medicine

## 2014-05-26 VITALS — BP 104/62 | HR 98 | Temp 98.5°F | Resp 18 | Ht 61.0 in | Wt 191.0 lb

## 2014-05-26 DIAGNOSIS — E039 Hypothyroidism, unspecified: Secondary | ICD-10-CM

## 2014-05-26 DIAGNOSIS — N390 Urinary tract infection, site not specified: Secondary | ICD-10-CM

## 2014-05-26 DIAGNOSIS — I10 Essential (primary) hypertension: Secondary | ICD-10-CM

## 2014-05-26 LAB — LIPID PANEL
CHOLESTEROL: 187 mg/dL (ref 0–200)
HDL: 74 mg/dL (ref >39)
LDL Cholesterol: 83 mg/dL (ref 0–99)
TRIGLYCERIDES: 149 mg/dL (ref <150)
Total CHOL/HDL Ratio: 2.5 Ratio
VLDL: 30 mg/dL (ref 0–40)

## 2014-05-26 LAB — COMPLETE METABOLIC PANEL WITH GFR
ALK PHOS: 46 U/L (ref 39–117)
ALT: 13 U/L (ref 0–35)
AST: 20 U/L (ref 0–37)
Albumin: 4.1 g/dL (ref 3.5–5.2)
BILIRUBIN TOTAL: 0.6 mg/dL (ref 0.2–1.2)
BUN: 21 mg/dL (ref 6–23)
CO2: 27 mEq/L (ref 19–32)
Calcium: 9.3 mg/dL (ref 8.4–10.5)
Chloride: 100 mEq/L (ref 96–112)
Creat: 1.01 mg/dL (ref 0.50–1.10)
GFR, EST NON AFRICAN AMERICAN: 55 mL/min — AB
GFR, Est African American: 64 mL/min
Glucose, Bld: 68 mg/dL — ABNORMAL LOW (ref 70–99)
Potassium: 3.4 mEq/L — ABNORMAL LOW (ref 3.5–5.3)
Sodium: 137 mEq/L (ref 135–145)
Total Protein: 6.9 g/dL (ref 6.0–8.3)

## 2014-05-26 LAB — URINALYSIS, ROUTINE W REFLEX MICROSCOPIC
Glucose, UA: NEGATIVE mg/dL
HGB URINE DIPSTICK: NEGATIVE
Leukocytes, UA: NEGATIVE
Nitrite: NEGATIVE
PH: 6 (ref 5.0–8.0)
PROTEIN: NEGATIVE mg/dL
Specific Gravity, Urine: 1.02 (ref 1.005–1.030)
Urobilinogen, UA: 0.2 mg/dL (ref 0.0–1.0)

## 2014-05-26 LAB — TSH: TSH: 3.49 u[IU]/mL (ref 0.350–4.500)

## 2014-05-26 NOTE — Progress Notes (Signed)
Subjective:    Patient ID: Joan Weaver, female    DOB: 1941/04/26, 73 y.o.   MRN: [id number]  HPI Patient was treated with cipro 500 bid for 7 days for uti on 7/29.  Urine culture confirmed e.coli and it was sensitive to cipro.  She continues to have occasional dysuria and irritation in her perineum. Urinalysis was negative for urinary tract infection today. Patient is concerned it may be yeast.  She also has a history of hypothyroidism as well as hypertension. Her blood pressure is well controlled today. She denies any chest pain shortness of breath or dyspnea on exertion. She is overdue for TSH. Past Medical History  Diagnosis Date  . Thyroid disease   . Osteoporosis   . Hernia   . Cataract   . Ulcer   . Urinary incontinence   . Hypertension     no meds x 3 yrs  . Heart murmur     slight murmur -never had any problems  . Hypothyroidism   . 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 H/O bladder infections   . GERD (gastroesophageal reflux disease)     minor - tx with zantac  . Headache(784.0)     otc med prn  . Breast cancer August 2011    left - lumpectomy  . Arthritis     knees, hips   Past Surgical History  Procedure Laterality Date  . Hiatal hernia repair      x 2  . Tonsillectomy    . Appendectomy    . Left cataract surgery    . Breast lumpectomy      left  . Knee surgery      Arthroscopic  . Eye surgery      left- cataract   . Hysteroscopy w/d&c N/A 07/07/2013    Procedure: DILATATION AND CURETTAGE /HYSTEROSCOPY;  Surgeon: Anastasio Auerbach, MD;  Location: [facility];  Service: Gynecology;  Laterality: N/A;   Current Outpatient Prescriptions on File Prior to Visit  Medication Sig Dispense Refill  . acetaminophen (TYLENOL) 500 MG tablet Take 500 mg by mouth every 6 (six) hours as needed for pain.       Marland Kitchen amLODipine (NORVASC) 10 MG tablet TAKE 1 TABLET BY MOUTH DAILY  30 tablet  11  . BIOTIN FORTE PO Take 1 tablet by mouth  daily.      . busPIRone (BUSPAR) 7.5 MG tablet Take 1 tablet (7.5 mg total) by mouth 2 (two) times daily.  60 tablet  3  . CALCIUM PO Take 1 tablet by mouth daily.       . Cholecalciferol (VITAMIN D) 1000 UNITS capsule Take 1,000 Units by mouth daily.       . ciprofloxacin (CIPRO) 500 MG tablet Take 1 tablet (500 mg total) by mouth 2 (two) times daily.  14 tablet  0  . Cranberry 500 MG CAPS Take 2 capsules by mouth daily.       Marland Kitchen etodolac (LODINE) 500 MG tablet TAKE 1 TABLET BY MOUTH TWICE A DAY  30 tablet  3  . fish oil-omega-3 fatty acids 1000 MG capsule Take 2 g by mouth daily.       Marland Kitchen glucosamine-chondroitin 500-400 MG tablet Take 1 tablet by mouth daily.      . hydrochlorothiazide (HYDRODIURIL) 25 MG tablet Take 1 tablet (25 mg total) by mouth daily.  30 tablet  11  . levothyroxine (SYNTHROID, LEVOTHROID) 25  MCG tablet TAKE 1 TABLET (25 MCG TOTAL) BY MOUTH DAILY.  30 tablet  5  . loratadine (CLARITIN) 10 MG tablet Take 10 mg by mouth daily.        . Magnesium 100 MG CAPS Take 1 capsule by mouth 2 (two) times daily.       . meclizine (ANTIVERT) 25 MG tablet Take 25 mg by mouth 3 (three) times daily as needed for dizziness.       . Multiple Vitamin (MULTIVITAMIN) capsule Take 1 capsule by mouth daily.        . Polyvinyl Alcohol-Povidone (CLEAR EYES ALL SEASONS OP) Apply 1 drop to eye daily as needed (dry eyes).      . pyridOXINE (VITAMIN B-6) 100 MG tablet Take 100 mg by mouth daily.        . ranitidine (ZANTAC) 150 MG tablet Take 150 mg by mouth daily.       . tamoxifen (NOLVADEX) 20 MG tablet TAKE 1 TABLET EVERY DAY  90 tablet  1  . vitamin E 400 UNIT capsule Take 400 Units by mouth daily.         No current facility-administered medications on file prior to visit.   Allergies  Allergen Reactions  . Advair Diskus [Fluticasone-Salmeterol]     Nervous and shakey  . Aspirin     If she takes too much, it burns her stomach  . Bactrim [Sulfamethoxazole-Trimethoprim] Nausea Only  .  Demerol Nausea Only   History   Social History  . Marital Status: Widowed    Spouse Name: N/A    Number of Children: 0  . Years of Education: N/A   Occupational History  . Retired      Personal assistant   Social History Main Topics  . Smoking status: Never Smoker   . Smokeless tobacco: Never Used  . Alcohol Use: No  . Drug Use: No  . Sexual Activity: Not Currently    Birth Control/ Protection: Post-menopausal   Other Topics Concern  . Not on file   Social History Narrative  . No narrative on file   Family History  Problem Relation Age of Onset  . Prostate cancer Maternal Grandfather   . Cancer Paternal Grandfather     Stomach cancer  . Rheum arthritis Mother   . Heart disease Maternal Grandmother   . Breast cancer Maternal Aunt 70     Review of Systems  All other systems reviewed and are negative.      Objective:   Physical Exam  Vitals reviewed. Constitutional: She appears well-developed and well-nourished. No distress.  HENT:  Nose: Nose normal.  Mouth/Throat: Oropharynx is clear and moist. No oropharyngeal exudate.  Neck: Neck supple. No thyromegaly present.  Cardiovascular: Normal rate, regular rhythm and normal heart sounds.   No murmur heard. Pulmonary/Chest: Effort normal and breath sounds normal. No respiratory distress. She has no wheezes. She has no rales.  Abdominal: Soft. Bowel sounds are normal. She exhibits no distension. There is no tenderness. There is no rebound and no guarding.  Musculoskeletal: She exhibits no edema.  Lymphadenopathy:    She has no cervical adenopathy.  Skin: She is not diaphoretic.          Assessment & Plan:  Essential hypertension - Plan: COMPLETE METABOLIC PANEL WITH GFR, Lipid panel  Unspecified hypothyroidism - Plan: TSH  Urinary tract infection, site not specified - Plan: Urinalysis, Routine w reflex microscopic, Urine culture  blood pressures well controlled. Continue current medications at the present  dosages. Check TSH. TSH is within therapeutic range continue levothyroxine 25 mcg by mouth daily. Urinalysis does not suggest urinary tract infection. I will check a urine culture. Urine culture is clear, I will treat the patient Diflucan.

## 2014-05-27 LAB — URINE CULTURE

## 2014-05-29 ENCOUNTER — Other Ambulatory Visit: Payer: Self-pay | Admitting: Family Medicine

## 2014-05-29 MED ORDER — LEVOTHYROXINE SODIUM 25 MCG PO TABS: ORAL_TABLET | ORAL | Status: DC

## 2014-05-29 MED ORDER — FLUCONAZOLE 150 MG PO TABS: 150.0000 mg | ORAL_TABLET | Freq: Once | ORAL | Status: DC

## 2014-07-27 ENCOUNTER — Other Ambulatory Visit: Payer: Self-pay | Admitting: Family Medicine

## 2014-07-27 NOTE — Telephone Encounter (Signed)
Refill appropriate and filled per protocol. 

## 2014-08-20 ENCOUNTER — Ambulatory Visit (HOSPITAL_BASED_OUTPATIENT_CLINIC_OR_DEPARTMENT_OTHER): Payer: Medicare Other | Admitting: Hematology and Oncology

## 2014-08-20 ENCOUNTER — Other Ambulatory Visit (HOSPITAL_BASED_OUTPATIENT_CLINIC_OR_DEPARTMENT_OTHER): Payer: Medicare Other

## 2014-08-20 ENCOUNTER — Telehealth: Payer: Self-pay | Admitting: Hematology and Oncology

## 2014-08-20 VITALS — BP 124/79 | HR 97 | Temp 98.2°F | Resp 18 | Ht 61.0 in | Wt 190.5 lb

## 2014-08-20 DIAGNOSIS — C50912 Malignant neoplasm of unspecified site of left female breast: Secondary | ICD-10-CM

## 2014-08-20 DIAGNOSIS — Z853 Personal history of malignant neoplasm of breast: Secondary | ICD-10-CM

## 2014-08-20 LAB — COMPREHENSIVE METABOLIC PANEL (CC13)
ALT: 16 U/L (ref 0–55)
ANION GAP: 5 meq/L (ref 3–11)
AST: 21 U/L (ref 5–34)
Albumin: 3.4 g/dL — ABNORMAL LOW (ref 3.5–5.0)
Alkaline Phosphatase: 55 U/L (ref 40–150)
BUN: 25.8 mg/dL (ref 7.0–26.0)
CALCIUM: 9.4 mg/dL (ref 8.4–10.4)
CO2: 30 mEq/L — ABNORMAL HIGH (ref 22–29)
Chloride: 105 mEq/L (ref 98–109)
Creatinine: 1.1 mg/dL (ref 0.6–1.1)
Glucose: 95 mg/dl (ref 70–140)
POTASSIUM: 3.6 meq/L (ref 3.5–5.1)
Sodium: 140 mEq/L (ref 136–145)
TOTAL PROTEIN: 6.7 g/dL (ref 6.4–8.3)
Total Bilirubin: 0.37 mg/dL (ref 0.20–1.20)

## 2014-08-20 LAB — CBC WITH DIFFERENTIAL/PLATELET
BASO%: 0.5 % (ref 0.0–2.0)
Basophils Absolute: 0 10*3/uL (ref 0.0–0.1)
EOS%: 2 % (ref 0.0–7.0)
Eosinophils Absolute: 0.1 10*3/uL (ref 0.0–0.5)
HEMATOCRIT: 40.7 % (ref 34.8–46.6)
HEMOGLOBIN: 13.4 g/dL (ref 11.6–15.9)
LYMPH#: 1.9 10*3/uL (ref 0.9–3.3)
LYMPH%: 34.7 % (ref 14.0–49.7)
MCH: 29.2 pg (ref 25.1–34.0)
MCHC: 32.9 g/dL (ref 31.5–36.0)
MCV: 88.7 fL (ref 79.5–101.0)
MONO#: 0.5 10*3/uL (ref 0.1–0.9)
MONO%: 9.4 % (ref 0.0–14.0)
NEUT#: 2.9 10*3/uL (ref 1.5–6.5)
NEUT%: 53.4 % (ref 38.4–76.8)
Platelets: 195 10*3/uL (ref 145–400)
RBC: 4.59 10*6/uL (ref 3.70–5.45)
RDW: 14 % (ref 11.2–14.5)
WBC: 5.5 10*3/uL (ref 3.9–10.3)

## 2014-08-20 NOTE — Assessment & Plan Note (Signed)
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  Surveillance: This was exam was normal. I reviewed the mammograms were done in July 2015 which were normal. No clinical evidence of disease relapse.  Survivorship: Encouraged the patient has a more active and do exercise every day. I greatly more fruits and vegetables.  Return to clinic in 6 months for followup.

## 2014-08-20 NOTE — Telephone Encounter (Signed)
per pof to sch pt appt-gave pt copy of sch °

## 2014-08-20 NOTE — Progress Notes (Signed)
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  STAGE: Left breast at the 9:00 position T1 N0 (stage I) ER +98% PR +97% HER-2/neu negative, Ki-67 18% Invasive ductal carcinoma  PRIOR THERAPY:  #1 patient presented at the age of 65 for a screening mammogram in 2011 that showed a suspicious mass in the left 9:00 position. Biopsy showed invasive ductal carcinoma HER-2/neu negative ER positive PR positive respectively at 98 and 97% proliferation marker Ki-67 18%. She had a MRI that showed a solitary enhancing mass in the medial aspect of the left breast measuring 8 x 6 x 10 mm.  #2 05/11/2010 patient underwent a lumpectomy that showed invasive ductal carcinoma measuring 6.0 mm, grade 2, no angiolymphatic invasion. All resection margins were negative. 2 sentinel nodes were negative for metastatic disease. Tumor was strongly ER +98% PR +97%.  #3 patient subsequently received radiation therapy by Dr. Kyung Rudd which she completed in August 2011.  #4 she was then begun on tamoxifen 20 mg daily starting in September 2011.   CURRENT THERAPY: Tamoxifen 20 mg daily  CHIEF COMPLIANT: followup on tamoxifen  INTERVAL HISTORY: Joan Weaver is a 73 year old Caucasian with above-mentioned history of stage I invasive ductal carcinoma involving left breast. She had a lumpectomy followed by radiation therapy and has been on tamoxifen since September 2011. She reports no major problems tolerating tamoxifen. She reports no hot flashes or myalgias. She gets annual mammograms in July and they have been normal.she does have arthritis in the knees.  REVIEW OF SYSTEMS:   Constitutional: Denies fevers, chills or abnormal weight loss Eyes: Denies blurriness of vision Ears, nose, mouth, throat, and  face: Denies mucositis or sore throat Respiratory: Denies cough, dyspnea or wheezes Cardiovascular: Denies palpitation, chest discomfort or lower extremity swelling Gastrointestinal:  Denies nausea, heartburn or change in bowel habits Skin: Denies abnormal skin rashes Lymphatics: Denies new lymphadenopathy or easy bruising Neurological:arthritis in the knees Behavioral/Psych: Mood is stable, no new changes  Breast:  denies any pain or lumps or nodules in either breasts All other systems were reviewed with the patient and are negative.  I have reviewed the past medical history, past surgical history, social history and family history with the patient and they are unchanged from previous note.  ALLERGIES:  is allergic to advair diskus; aspirin; bactrim; demerol; and meperidine.  MEDICATIONS:  Current Outpatient Prescriptions  Medication Sig Dispense Refill  . acetaminophen (TYLENOL) 500 MG tablet Take 500 mg by mouth every 6 (six) hours as needed for pain.     Marland Kitchen amLODipine (NORVASC) 10 MG tablet TAKE 1 TABLET BY MOUTH DAILY 30 tablet 11  . BIOTIN FORTE PO Take 1 tablet by mouth daily.    Marland Kitchen CALCIUM PO Take 1 tablet by mouth daily.     . Cholecalciferol (VITAMIN D) 1000 UNITS capsule Take 1,000 Units by mouth daily.     . Cranberry 500 MG CAPS Take 2 capsules by mouth daily.     Marland Kitchen etodolac (LODINE) 500 MG tablet TAKE 1 TABLET BY MOUTH TWICE A DAY 30 tablet 3  . fish oil-omega-3 fatty acids 1000 MG capsule Take 2 g by mouth daily.     Marland Kitchen glucosamine-chondroitin 500-400 MG tablet Take 1 tablet by mouth daily.    . hydrochlorothiazide (HYDRODIURIL) 25 MG tablet Take  1 tablet (25 mg total) by mouth daily. 30 tablet 11  . levothyroxine (SYNTHROID, LEVOTHROID) 25 MCG tablet TAKE 1 TABLET (25 MCG TOTAL) BY MOUTH DAILY. 30 tablet 11  . loratadine (CLARITIN) 10 MG tablet Take 10 mg by mouth daily.      . Magnesium 100 MG CAPS Take 1 capsule by mouth 2 (two) times daily.     . meclizine (ANTIVERT) 25  MG tablet Take 25 mg by mouth 3 (three) times daily as needed for dizziness.     . Multiple Vitamin (MULTIVITAMIN) capsule Take 1 capsule by mouth daily.      . Polyvinyl Alcohol-Povidone (CLEAR EYES ALL SEASONS OP) Apply 1 drop to eye daily as needed (dry eyes).    . pyridOXINE (VITAMIN B-6) 100 MG tablet Take 100 mg by mouth daily.      . ranitidine (ZANTAC) 150 MG tablet Take 150 mg by mouth daily.     . tamoxifen (NOLVADEX) 20 MG tablet TAKE 1 TABLET EVERY DAY 90 tablet 1  . vitamin E 400 UNIT capsule Take 400 Units by mouth daily.       No current facility-administered medications for this visit.    PHYSICAL EXAMINATION: ECOG PERFORMANCE STATUS: 1 - Symptomatic but completely ambulatory  Filed Vitals:   08/20/14 1354  BP: 124/79  Pulse: 97  Temp: 98.2 F (36.8 C)  Resp: 18   Filed Weights   08/20/14 1354  Weight: 190 lb 8 oz (86.41 kg)    GENERAL:alert, no distress and comfortable SKIN: skin color, texture, turgor are normal, no rashes or significant lesions EYES: normal, Conjunctiva are pink and non-injected, sclera clear OROPHARYNX:no exudate, no erythema and lips, buccal mucosa, and tongue normal  NECK: supple, thyroid normal size, non-tender, without nodularity LYMPH:  no palpable lymphadenopathy in the cervical, axillary or inguinal LUNGS: clear to auscultation and percussion with normal breathing effort HEART: regular rate & rhythm and no murmurs and no lower extremity edema ABDOMEN:abdomen soft, non-tender and normal bowel sounds Musculoskeletal:no cyanosis of digits and no clubbing  NEURO: alert & oriented x 3 with fluent speech, no focal motor/sensory deficits BREAST: No palpable masses or nodules in either right or left breasts. No palpable axillary supraclavicular or infraclavicular adenopathy no breast tenderness or nipple discharge.   LABORATORY DATA:  I have reviewed the data as listed   Chemistry      Component Value Date/Time   NA 137 05/26/2014 1007    NA 141 02/19/2014 1451   K 3.4* 05/26/2014 1007   K 4.0 02/19/2014 1451   CL 100 05/26/2014 1007   CL 108* 07/30/2012 1318   CO2 27 05/26/2014 1007   CO2 25 02/19/2014 1451   BUN 21 05/26/2014 1007   BUN 22.0 02/19/2014 1451   CREATININE 1.01 05/26/2014 1007   CREATININE 1.1 02/19/2014 1451   CREATININE 0.74 06/27/2013 1240      Component Value Date/Time   CALCIUM 9.3 05/26/2014 1007   CALCIUM 9.7 02/19/2014 1451   ALKPHOS 46 05/26/2014 1007   ALKPHOS 52 02/19/2014 1451   AST 20 05/26/2014 1007   AST 21 02/19/2014 1451   ALT 13 05/26/2014 1007   ALT 16 02/19/2014 1451   BILITOT 0.6 05/26/2014 1007   BILITOT 0.34 02/19/2014 1451       Lab Results  Component Value Date   WBC 5.5 08/20/2014   HGB 13.4 08/20/2014   HCT 40.7 08/20/2014   MCV 88.7 08/20/2014   PLT 195 08/20/2014   NEUTROABS  2.9 08/20/2014     RADIOGRAPHIC STUDIES: Mammograms done in July 2015 were normal  ASSESSMENT & PLAN:  Breast cancer, left stage I (T1 in 0) invasive ductal carcinoma she is status post lumpectomy followed by radiation now on tamoxifen since September 2011  Tamoxifen counseling: Patient is tolerating tamoxifen extremely well without any major problems or concerns.  Surveillance: This was exam was normal. I reviewed the mammograms were done in July 2015 which were normal. No clinical evidence of disease relapse.  Survivorship: Encouraged the patient has a more active and do exercise every day. I greatly more fruits and vegetables.  Return to clinic in 6 months for followup.   No orders of the defined types were placed in this encounter.   The patient has a good understanding of the overall plan. she agrees with it. She will call with any problems that may develop before her next visit here.  I spent 15 minutes counseling the patient face to face. The total time spent in the appointment was 15 minutes and more than 50% was on counseling and review of test results    Rulon Eisenmenger, MD 08/20/2014 2:27 PM

## 2014-08-23 ENCOUNTER — Other Ambulatory Visit: Payer: Self-pay | Admitting: Adult Health

## 2014-08-23 DIAGNOSIS — C50919 Malignant neoplasm of unspecified site of unspecified female breast: Secondary | ICD-10-CM

## 2014-09-18 ENCOUNTER — Ambulatory Visit (INDEPENDENT_AMBULATORY_CARE_PROVIDER_SITE_OTHER): Payer: Medicare Other | Admitting: Family Medicine

## 2014-09-18 ENCOUNTER — Encounter: Payer: Self-pay | Admitting: Family Medicine

## 2014-09-18 VITALS — BP 136/84 | HR 112 | Temp 98.1°F | Resp 20 | Wt 186.0 lb

## 2014-09-18 DIAGNOSIS — N3000 Acute cystitis without hematuria: Secondary | ICD-10-CM

## 2014-09-18 DIAGNOSIS — Z23 Encounter for immunization: Secondary | ICD-10-CM

## 2014-09-18 DIAGNOSIS — R319 Hematuria, unspecified: Secondary | ICD-10-CM

## 2014-09-18 LAB — URINALYSIS, ROUTINE W REFLEX MICROSCOPIC
Glucose, UA: NEGATIVE mg/dL
HGB URINE DIPSTICK: NEGATIVE
NITRITE: POSITIVE — AB
PROTEIN: 30 mg/dL — AB
Specific Gravity, Urine: >1.03 — ABNORMAL HIGH (ref 1.005–1.030)
UROBILINOGEN UA: 0.2 mg/dL (ref 0.0–1.0)
pH: 6 (ref 5.0–8.0)

## 2014-09-18 LAB — URINALYSIS, MICROSCOPIC ONLY

## 2014-09-18 MED ORDER — CIPROFLOXACIN HCL 500 MG PO TABS: 500.0000 mg | ORAL_TABLET | Freq: Two times a day (BID) | ORAL | Status: DC

## 2014-09-18 NOTE — Addendum Note (Signed)
Addended by: Olena Mater on: 09/18/2014 12:10 PM   Modules accepted: Orders

## 2014-09-18 NOTE — Progress Notes (Signed)
Subjective:    Patient ID: Joan Weaver, female    DOB: [birth year], 73 y.o.   MRN: [id number]  HPI Patient reports urgency, frequency, and hesitancy for 4 days.  She has mild dysuria.  She has seen trace hematuria.  She denies lbp, fever.  Complains of nausea. Past Medical History  Diagnosis Date  . Thyroid disease   . Osteoporosis   . Hernia   . Cataract   . Ulcer   . Urinary incontinence   . Hypertension     no meds x 3 yrs  . Heart murmur     slight murmur -never had any problems  . Hypothyroidism   . 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 H/O bladder infections   . GERD (gastroesophageal reflux disease)     minor - tx with zantac  . Headache(784.0)     otc med prn  . Breast cancer August 2011    left - lumpectomy  . Arthritis     knees, hips   Past Surgical History  Procedure Laterality Date  . Hiatal hernia repair      x 2  . Tonsillectomy    . Appendectomy    . Left cataract surgery    . Breast lumpectomy      left  . Knee surgery      Arthroscopic  . Eye surgery      left- cataract   . Hysteroscopy w/d&c N/A 07/07/2013    Procedure: DILATATION AND CURETTAGE /HYSTEROSCOPY;  Surgeon: Anastasio Auerbach, MD;  Location: [facility];  Service: Gynecology;  Laterality: N/A;   Current Outpatient Prescriptions on File Prior to Visit  Medication Sig Dispense Refill  . acetaminophen (TYLENOL) 500 MG tablet Take 500 mg by mouth every 6 (six) hours as needed for pain.     Marland Kitchen amLODipine (NORVASC) 10 MG tablet TAKE 1 TABLET BY MOUTH DAILY 30 tablet 11  . BIOTIN FORTE PO Take 1 tablet by mouth daily.    Marland Kitchen CALCIUM PO Take 1 tablet by mouth daily.     . Cholecalciferol (VITAMIN D) 1000 UNITS capsule Take 1,000 Units by mouth daily.     . Cranberry 500 MG CAPS Take 2 capsules by mouth daily.     Marland Kitchen etodolac (LODINE) 500 MG tablet TAKE 1 TABLET BY MOUTH TWICE A DAY 30 tablet 3  . fish oil-omega-3 fatty acids 1000 MG capsule Take 2 g by  mouth daily.     Marland Kitchen glucosamine-chondroitin 500-400 MG tablet Take 1 tablet by mouth daily.    . hydrochlorothiazide (HYDRODIURIL) 25 MG tablet Take 1 tablet (25 mg total) by mouth daily. 30 tablet 11  . levothyroxine (SYNTHROID, LEVOTHROID) 25 MCG tablet TAKE 1 TABLET (25 MCG TOTAL) BY MOUTH DAILY. 30 tablet 11  . loratadine (CLARITIN) 10 MG tablet Take 10 mg by mouth daily.      . Magnesium 100 MG CAPS Take 1 capsule by mouth 2 (two) times daily.     . meclizine (ANTIVERT) 25 MG tablet Take 25 mg by mouth 3 (three) times daily as needed for dizziness.     . Multiple Vitamin (MULTIVITAMIN) capsule Take 1 capsule by mouth daily.      . Polyvinyl Alcohol-Povidone (CLEAR EYES ALL SEASONS OP) Apply 1 drop to eye daily as needed (dry eyes).    . pyridOXINE (VITAMIN B-6) 100 MG tablet Take 100 mg by mouth daily.      Marland Kitchen  ranitidine (ZANTAC) 150 MG tablet Take 150 mg by mouth daily.     . tamoxifen (NOLVADEX) 20 MG tablet Take 1 tablet (20 mg total) by mouth daily. 90 tablet 1  . vitamin E 400 UNIT capsule Take 400 Units by mouth daily.       No current facility-administered medications on file prior to visit.   Allergies  Allergen Reactions  . Advair Diskus [Fluticasone-Salmeterol]     Nervous and shakey  . Aspirin     If she takes too much, it burns her stomach  . Bactrim [Sulfamethoxazole-Trimethoprim] Nausea Only  . Demerol Nausea Only  . Meperidine Nausea Only    Sick on Stomach   History   Social History  . Marital Status: Widowed    Spouse Name: N/A    Number of Children: 0  . Years of Education: N/A   Occupational History  . Retired      Personal assistant   Social History Main Topics  . Smoking status: Never Smoker   . Smokeless tobacco: Never Used  . Alcohol Use: No  . Drug Use: No  . Sexual Activity: Not Currently    Birth Control/ Protection: Post-menopausal   Other Topics Concern  . Not on file   Social History Narrative      Review of Systems  All other systems  reviewed and are negative.      Objective:   Physical Exam  Cardiovascular: Normal rate, regular rhythm and normal heart sounds.   Pulmonary/Chest: Effort normal. She has wheezes.  Abdominal: Soft. Bowel sounds are normal. She exhibits no distension. There is no tenderness. There is no rebound and no guarding.  Vitals reviewed. no cvat        Assessment & Plan:  Blood in urine - Plan: Urinalysis, Routine w reflex microscopic  Urine os is reveals leukocyte esterase as well as nitrite but no actual blood. Symptoms are consistent with a urinary tract infection. Therefore I will start the patient on Cipro 500 mg by mouth twice a day for 7 days. I will also check a urine culture. If urine culture is negative and symptoms persist, will need workup hematuria with a possible cystoscopy.

## 2014-09-22 LAB — URINE CULTURE

## 2014-09-24 ENCOUNTER — Other Ambulatory Visit: Payer: Self-pay | Admitting: Family Medicine

## 2014-09-24 MED ORDER — CEPHALEXIN 500 MG PO CAPS: 500.0000 mg | ORAL_CAPSULE | Freq: Three times a day (TID) | ORAL | Status: DC

## 2014-10-07 ENCOUNTER — Other Ambulatory Visit: Payer: Self-pay | Admitting: Family Medicine

## 2014-10-08 ENCOUNTER — Telehealth: Payer: Self-pay | Admitting: Family Medicine

## 2014-10-08 MED ORDER — FLUCONAZOLE 150 MG PO TABS: 150.0000 mg | ORAL_TABLET | Freq: Once | ORAL | Status: DC

## 2014-10-08 NOTE — Telephone Encounter (Signed)
[phone] [facility] PT has been on 2 anabiotics and now she has a yeast infection and she would like to have something called in

## 2014-10-08 NOTE — Telephone Encounter (Signed)
Diflucan 150 po x 1

## 2014-10-08 NOTE — Telephone Encounter (Signed)
Prescription sent to pharmacy. .   Call placed to patient and patient made aware.  

## 2014-10-15 ENCOUNTER — Encounter (INDEPENDENT_AMBULATORY_CARE_PROVIDER_SITE_OTHER): Payer: Medicare Other | Admitting: Ophthalmology

## 2014-10-15 DIAGNOSIS — H35033 Hypertensive retinopathy, bilateral: Secondary | ICD-10-CM

## 2014-10-15 DIAGNOSIS — H3561 Retinal hemorrhage, right eye: Secondary | ICD-10-CM

## 2014-10-15 DIAGNOSIS — D3132 Benign neoplasm of left choroid: Secondary | ICD-10-CM

## 2014-10-15 DIAGNOSIS — H43813 Vitreous degeneration, bilateral: Secondary | ICD-10-CM

## 2014-10-15 DIAGNOSIS — I10 Essential (primary) hypertension: Secondary | ICD-10-CM

## 2014-10-20 ENCOUNTER — Other Ambulatory Visit: Payer: Self-pay | Admitting: Family Medicine

## 2014-10-20 NOTE — Telephone Encounter (Signed)
Medication refilled per protocol. 

## 2014-11-10 ENCOUNTER — Other Ambulatory Visit: Payer: Self-pay | Admitting: Family Medicine

## 2014-11-10 MED ORDER — HYDROCHLOROTHIAZIDE 25 MG PO TABS: 25.0000 mg | ORAL_TABLET | Freq: Every day | ORAL | Status: DC

## 2014-11-10 NOTE — Telephone Encounter (Signed)
Med sent to pharm 

## 2015-02-01 ENCOUNTER — Other Ambulatory Visit: Payer: Self-pay | Admitting: Family Medicine

## 2015-02-22 ENCOUNTER — Other Ambulatory Visit: Payer: Self-pay | Admitting: Hematology and Oncology

## 2015-02-22 NOTE — Telephone Encounter (Signed)
last ov 08/24/14.  Next ov 02/25/15.  Chart reviewed

## 2015-02-25 ENCOUNTER — Ambulatory Visit (HOSPITAL_BASED_OUTPATIENT_CLINIC_OR_DEPARTMENT_OTHER): Payer: Medicare Other | Admitting: Hematology and Oncology

## 2015-02-25 VITALS — BP 129/73 | HR 114 | Temp 97.9°F | Resp 18 | Ht 61.0 in | Wt 191.9 lb

## 2015-02-25 DIAGNOSIS — Z853 Personal history of malignant neoplasm of breast: Secondary | ICD-10-CM

## 2015-02-25 DIAGNOSIS — C50912 Malignant neoplasm of unspecified site of left female breast: Secondary | ICD-10-CM

## 2015-02-25 NOTE — Assessment & Plan Note (Signed)
Left breast invasive ductal carcinoma T1b N0 M0 stage IA status post left lumpectomy followed by radiation and tamoxifen started in September 2011  Tamoxifen toxicities:  I discussed with her duration of therapy with tamoxifen and the current results from the ATLAS clinical trial. There is survival advantage for continuation of tamoxifen beyond 5 years up to 10 years. The overall survival advantage was 4% for extended adjuvant therapy.  Breast Cancer Surveillance: 1. Breast exam 02/25/2015: Normal 2. Mammogram 04/27/2014 No abnormalities. Postsurgical changes. Breast Density Category B. I recommended that she get 3-D mammograms for surveillance. Discussed the differences between different breast density categories.   Survivorship:Discussed the importance of physical exercise in decreasing the likelihood of breast cancer recurrence. Recommended 30 mins daily 6 days a week of either brisk walking or cycling or swimming. Encouraged patient to eat more fruits and vegetables and decrease red meat.   Return to clinic in 1 year for follow-up

## 2015-02-25 NOTE — Progress Notes (Signed)
Patient Care Team: [name], MD as PCP - General (Family Medicine) Eston Esters, MD as Consulting Physician (Hematology and Oncology)  DIAGNOSIS: No matching staging information was found for the patient.  SUMMARY OF ONCOLOGIC HISTORY:   Breast cancer, left   05/11/2010 Surgery Left breast lumpectomy: Invasive ductal carcinoma 6 mm, grade 2, margins negative, 0/2 sentinel nodes, ER 98%, PR 97%, HER-2 negative, Ki-67 18%, T1b N0 M0 stage IA   05/30/2010 - 07/06/2010 Radiation Therapy Adjuvant radiation by Dr. Lisbeth Renshaw   07/08/2010 -  Anti-estrogen oral therapy Tamoxifen 20 mg daily    CHIEF COMPLIANT: Follow-up of left breast cancer on tamoxifen  INTERVAL HISTORY: Joan Weaver is a 74 year old with above-mentioned history of left breast cancer treated with lumpectomy radiation and is currently on tamoxifen. She is tolerating it extremely well. Occasional hot flashes. She denies any new lumps or nodules in the breasts.  REVIEW OF SYSTEMS:   Constitutional: Denies fevers, chills or abnormal weight loss Eyes: Denies blurriness of vision Ears, nose, mouth, throat, and face: Denies mucositis or sore throat Respiratory: Denies cough, dyspnea or wheezes Cardiovascular: Denies palpitation, chest discomfort or lower extremity swelling Gastrointestinal:  Denies nausea, heartburn or change in bowel habits Skin: Denies abnormal skin rashes Lymphatics: Denies new lymphadenopathy or easy bruising Neurological:Denies numbness, tingling or new weaknesses Behavioral/Psych: Mood is stable, no new changes  Breast:  denies any pain or lumps or nodules in either breasts All other systems were reviewed with the patient and are negative.  I have reviewed the past medical history, past surgical history, social history and family history with the patient and they are unchanged from previous note.  ALLERGIES:  is allergic to advair diskus; aspirin; bactrim; demerol; and meperidine.  MEDICATIONS:  Current  Outpatient Prescriptions  Medication Sig Dispense Refill  . acetaminophen (TYLENOL) 500 MG tablet Take 500 mg by mouth every 6 (six) hours as needed for pain.     Marland Kitchen amLODipine (NORVASC) 10 MG tablet TAKE 1 TABLET BY MOUTH DAILY 30 tablet 10  . BIOTIN FORTE PO Take 1 tablet by mouth daily.    Marland Kitchen CALCIUM PO Take 1 tablet by mouth daily.     . cephALEXin (KEFLEX) 500 MG capsule Take 1 capsule (500 mg total) by mouth 3 (three) times daily. 28 capsule 0  . Cholecalciferol (VITAMIN D) 1000 UNITS capsule Take 1,000 Units by mouth daily.     . Cranberry 500 MG CAPS Take 2 capsules by mouth daily.     Marland Kitchen etodolac (LODINE) 500 MG tablet TAKE 1 TABLET BY MOUTH TWICE A DAY 30 tablet 3  . fish oil-omega-3 fatty acids 1000 MG capsule Take 2 g by mouth daily.     Marland Kitchen glucosamine-chondroitin 500-400 MG tablet Take 1 tablet by mouth daily.    . hydrochlorothiazide (HYDRODIURIL) 25 MG tablet Take 1 tablet (25 mg total) by mouth daily. 90 tablet 3  . levothyroxine (SYNTHROID, LEVOTHROID) 25 MCG tablet TAKE 1 TABLET (25 MCG TOTAL) BY MOUTH DAILY. 30 tablet 11  . loratadine (CLARITIN) 10 MG tablet Take 10 mg by mouth daily.      . Magnesium 100 MG CAPS Take 1 capsule by mouth 2 (two) times daily.     . meclizine (ANTIVERT) 25 MG tablet Take 25 mg by mouth 3 (three) times daily as needed for dizziness.     . Multiple Vitamin (MULTIVITAMIN) capsule Take 1 capsule by mouth daily.      . Polyvinyl Alcohol-Povidone (CLEAR EYES ALL SEASONS  OP) Apply 1 drop to eye daily as needed (dry eyes).    . pyridOXINE (VITAMIN B-6) 100 MG tablet Take 100 mg by mouth daily.      . ranitidine (ZANTAC) 150 MG tablet Take 150 mg by mouth daily.     . tamoxifen (NOLVADEX) 20 MG tablet TAKE 1 TABLET (20 MG TOTAL) BY MOUTH DAILY. 90 tablet 1  . vitamin E 400 UNIT capsule Take 400 Units by mouth daily.       No current facility-administered medications for this visit.    PHYSICAL EXAMINATION: ECOG PERFORMANCE STATUS: 0 -  Asymptomatic  Filed Vitals:   02/25/15 1402  BP: 129/73  Pulse: 114  Temp: 97.9 F (36.6 C)  Resp: 18   Filed Weights   02/25/15 1402  Weight: 191 lb 14.4 oz (87.045 kg)    GENERAL:alert, no distress and comfortable SKIN: skin color, texture, turgor are normal, no rashes or significant lesions EYES: normal, Conjunctiva are pink and non-injected, sclera clear OROPHARYNX:no exudate, no erythema and lips, buccal mucosa, and tongue normal  NECK: supple, thyroid normal size, non-tender, without nodularity LYMPH:  no palpable lymphadenopathy in the cervical, axillary or inguinal LUNGS: clear to auscultation and percussion with normal breathing effort HEART: regular rate & rhythm and no murmurs and no lower extremity edema ABDOMEN:abdomen soft, non-tender and normal bowel sounds Musculoskeletal:no cyanosis of digits and no clubbing  NEURO: alert & oriented x 3 with fluent speech, no focal motor/sensory deficits BREAST: No palpable masses or nodules in either right or left breasts. Left breast surgical scar is palpated and slightly nodular. Small areas of redness in the medial aspect of the left breast. No palpable axillary supraclavicular or infraclavicular adenopathy no breast tenderness or nipple discharge. (exam performed in the presence of a chaperone)  LABORATORY DATA:  I have reviewed the data as listed   Chemistry      Component Value Date/Time   NA 140 08/20/2014 1340   NA 137 05/26/2014 1007   K 3.6 08/20/2014 1340   K 3.4* 05/26/2014 1007   CL 100 05/26/2014 1007   CL 108* 07/30/2012 1318   CO2 30* 08/20/2014 1340   CO2 27 05/26/2014 1007   BUN 25.8 08/20/2014 1340   BUN 21 05/26/2014 1007   CREATININE 1.1 08/20/2014 1340   CREATININE 1.01 05/26/2014 1007   CREATININE 0.74 06/27/2013 1240      Component Value Date/Time   CALCIUM 9.4 08/20/2014 1340   CALCIUM 9.3 05/26/2014 1007   ALKPHOS 55 08/20/2014 1340   ALKPHOS 46 05/26/2014 1007   AST 21 08/20/2014 1340    AST 20 05/26/2014 1007   ALT 16 08/20/2014 1340   ALT 13 05/26/2014 1007   BILITOT 0.37 08/20/2014 1340   BILITOT 0.6 05/26/2014 1007       Lab Results  Component Value Date   WBC 5.5 08/20/2014   HGB 13.4 08/20/2014   HCT 40.7 08/20/2014   MCV 88.7 08/20/2014   PLT 195 08/20/2014   NEUTROABS 2.9 08/20/2014     RADIOGRAPHIC STUDIES: I have personally reviewed the radiology reports and agreed with their findings. 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 Occasional hot flashes  I discussed with her duration of therapy with tamoxifen and the current results from the ATLAS clinical trial. There is survival advantage for  continuation of tamoxifen beyond 5 years up to 10 years. The overall survival advantage was 4% for extended adjuvant therapy. In order to clarify this further we discussed about sending for breast cancer index.  Breast Cancer Surveillance: 1. Breast exam 02/25/2015: Normal 2. Mammogram 04/27/2014 No abnormalities. Postsurgical changes. Breast Density Category B. I recommended that she get 3-D mammograms for surveillance. Discussed the differences between different breast density categories.   Survivorship:Discussed the importance of physical exercise in decreasing the likelihood of breast cancer recurrence. Recommended 30 mins daily 6 days a week of either brisk walking or cycling or swimming. Encouraged patient to eat more fruits and vegetables and decrease red meat.   Return to clinic in 1 year for follow-up  No orders of the defined types were placed in this encounter.   The patient has a good understanding of the overall plan. she agrees with it. she will call with any problems that may develop before the next visit here.   Rulon Eisenmenger, MD

## 2015-02-26 ENCOUNTER — Telehealth: Payer: Self-pay | Admitting: Hematology and Oncology

## 2015-02-26 ENCOUNTER — Encounter: Payer: Self-pay | Admitting: *Deleted

## 2015-02-26 NOTE — Progress Notes (Signed)
Ordered BCI Index per Dr. Lindi Adie.  Faxed requisition to [facility].

## 2015-02-26 NOTE — Telephone Encounter (Signed)
Spoke with patient and she is aware of her one year appointment

## 2015-03-11 ENCOUNTER — Encounter: Payer: Self-pay | Admitting: *Deleted

## 2015-03-11 NOTE — Progress Notes (Unsigned)
Received BCI score of 4.2.  Copy given to Dr. Lindi Adie and copy to medical records to scan.

## 2015-03-12 ENCOUNTER — Encounter (HOSPITAL_COMMUNITY): Payer: Self-pay

## 2015-04-15 ENCOUNTER — Ambulatory Visit (INDEPENDENT_AMBULATORY_CARE_PROVIDER_SITE_OTHER): Payer: Medicare Other | Admitting: Ophthalmology

## 2015-04-15 DIAGNOSIS — D3132 Benign neoplasm of left choroid: Secondary | ICD-10-CM

## 2015-04-15 DIAGNOSIS — I10 Essential (primary) hypertension: Secondary | ICD-10-CM | POA: Diagnosis not present

## 2015-04-15 DIAGNOSIS — H35033 Hypertensive retinopathy, bilateral: Secondary | ICD-10-CM | POA: Diagnosis not present

## 2015-04-15 DIAGNOSIS — H43813 Vitreous degeneration, bilateral: Secondary | ICD-10-CM | POA: Diagnosis not present

## 2015-04-26 ENCOUNTER — Ambulatory Visit (INDEPENDENT_AMBULATORY_CARE_PROVIDER_SITE_OTHER): Payer: Medicare Other | Admitting: Family Medicine

## 2015-04-26 VITALS — BP 110/68 | HR 98 | Temp 98.6°F | Resp 18 | Ht 61.0 in | Wt 194.0 lb

## 2015-04-26 DIAGNOSIS — R350 Frequency of micturition: Secondary | ICD-10-CM | POA: Diagnosis not present

## 2015-04-26 DIAGNOSIS — J8489 Other specified interstitial pulmonary diseases: Secondary | ICD-10-CM

## 2015-04-26 DIAGNOSIS — E038 Other specified hypothyroidism: Secondary | ICD-10-CM | POA: Diagnosis not present

## 2015-04-26 DIAGNOSIS — I10 Essential (primary) hypertension: Secondary | ICD-10-CM

## 2015-04-26 DIAGNOSIS — M810 Age-related osteoporosis without current pathological fracture: Secondary | ICD-10-CM | POA: Diagnosis not present

## 2015-04-26 LAB — URINALYSIS, ROUTINE W REFLEX MICROSCOPIC
Glucose, UA: NEGATIVE mg/dL
HGB URINE DIPSTICK: NEGATIVE
KETONES UR: 15 mg/dL — AB
Leukocytes, UA: NEGATIVE
Nitrite: NEGATIVE
SPECIFIC GRAVITY, URINE: 1.025 (ref 1.005–1.030)
Urobilinogen, UA: 0.2 mg/dL (ref 0.0–1.0)
pH: 6 (ref 5.0–8.0)

## 2015-04-26 MED ORDER — METOPROLOL SUCCINATE ER 25 MG PO TB24: 25.0000 mg | ORAL_TABLET | Freq: Every day | ORAL | Status: DC

## 2015-04-26 MED ORDER — BECLOMETHASONE DIPROPIONATE 80 MCG/ACT IN AERS: 2.0000 | INHALATION_SPRAY | Freq: Two times a day (BID) | RESPIRATORY_TRACT | Status: DC

## 2015-04-27 ENCOUNTER — Encounter: Payer: Self-pay | Admitting: Family Medicine

## 2015-04-27 ENCOUNTER — Other Ambulatory Visit: Payer: Medicare Other

## 2015-04-27 DIAGNOSIS — I10 Essential (primary) hypertension: Secondary | ICD-10-CM

## 2015-04-27 LAB — COMPLETE METABOLIC PANEL WITH GFR
ALK PHOS: 50 U/L (ref 39–117)
ALT: 15 U/L (ref 0–35)
AST: 22 U/L (ref 0–37)
Albumin: 3.8 g/dL (ref 3.5–5.2)
BUN: 18 mg/dL (ref 6–23)
CALCIUM: 9.2 mg/dL (ref 8.4–10.5)
CHLORIDE: 101 meq/L (ref 96–112)
CO2: 26 mEq/L (ref 19–32)
Creat: 0.82 mg/dL (ref 0.50–1.10)
GFR, Est African American: 82 mL/min
GFR, Est Non African American: 71 mL/min
Glucose, Bld: 103 mg/dL — ABNORMAL HIGH (ref 70–99)
POTASSIUM: 4.2 meq/L (ref 3.5–5.3)
SODIUM: 139 meq/L (ref 135–145)
Total Bilirubin: 0.6 mg/dL (ref 0.2–1.2)
Total Protein: 6.8 g/dL (ref 6.0–8.3)

## 2015-04-27 LAB — CBC WITH DIFFERENTIAL/PLATELET
BASOS PCT: 1 % (ref 0–1)
Basophils Absolute: 0.1 10*3/uL (ref 0.0–0.1)
EOS ABS: 0.2 10*3/uL (ref 0.0–0.7)
Eosinophils Relative: 3 % (ref 0–5)
HCT: 41.6 % (ref 36.0–46.0)
Hemoglobin: 13.6 g/dL (ref 12.0–15.0)
Lymphocytes Relative: 36 % (ref 12–46)
Lymphs Abs: 2 10*3/uL (ref 0.7–4.0)
MCH: 28.7 pg (ref 26.0–34.0)
MCHC: 32.7 g/dL (ref 30.0–36.0)
MCV: 87.8 fL (ref 78.0–100.0)
MONO ABS: 0.7 10*3/uL (ref 0.1–1.0)
MONOS PCT: 12 % (ref 3–12)
MPV: 10.1 fL (ref 8.6–12.4)
Neutro Abs: 2.6 10*3/uL (ref 1.7–7.7)
Neutrophils Relative %: 48 % (ref 43–77)
PLATELETS: 235 10*3/uL (ref 150–400)
RBC: 4.74 MIL/uL (ref 3.87–5.11)
RDW: 14.5 % (ref 11.5–15.5)
WBC: 5.5 10*3/uL (ref 4.0–10.5)

## 2015-04-27 LAB — LIPID PANEL
Cholesterol: 186 mg/dL (ref 0–200)
HDL: 76 mg/dL (ref >=46)
LDL CALC: 87 mg/dL (ref 0–99)
Total CHOL/HDL Ratio: 2.4 Ratio
Triglycerides: 116 mg/dL (ref <150)
VLDL: 23 mg/dL (ref 0–40)

## 2015-04-27 LAB — TSH: TSH: 2.457 u[IU]/mL (ref 0.350–4.500)

## 2015-04-27 NOTE — Progress Notes (Signed)
Subjective:    Patient ID: Joan Weaver, female    DOB: January 23, 1941, 74 y.o.   MRN: [id number]  HPI Patient is here today at our request for a general follow-up. She has a history of hypertension. Her blood pressure at home has been averaging greater than 140/70. Frequently her blood pressures in the 725D to 664Q systolic. She also has episodes of tachycardia. Associated with this she feels dizzy. She is due for fasting lab work. She also reports dysuria 1 day. Urinalysis today is significant only for blood. There is no evidence of urinary tract infection. She does have a history of breast cancer status post radiation therapy. Patient has a past medical history of possible radiation pneumonitis versus BOOP. In the past patient did well on prednisone but she does not want to take that anymore because the side effects. She cannot tolerate Advair because of tachycardia that caused. Today she is complaining of severe fatigue. On examination the only abnormality is decreased breath sounds throughout with diffuse faint expiratory wheezes and increased tachypnea with minimal exercise such as walking from the bathroom to the exam room. I believe this could be contributing to her fatigue. Mammogram is being performed a week. Colonoscopy is up-to-date, patient is due for a bone density. Immunizations are up-to-date. Past Medical History  Diagnosis Date  . Thyroid disease   . Osteoporosis   . Hernia   . Cataract   . Ulcer   . Urinary incontinence   . Hypertension     no meds x 3 yrs  . Heart murmur     slight murmur -never had any problems  . Hypothyroidism   . 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 H/O bladder infections   . GERD (gastroesophageal reflux disease)     minor - tx with zantac  . Headache(784.0)     otc med prn  . Breast cancer August 2011    left - lumpectomy  . Arthritis     knees, hips   Past Surgical History  Procedure Laterality  Date  . Hiatal hernia repair      x 2  . Tonsillectomy    . Appendectomy    . Left cataract surgery    . Breast lumpectomy      left  . Knee surgery      Arthroscopic  . Eye surgery      left- cataract   . Hysteroscopy w/d&c N/A 07/07/2013    Procedure: DILATATION AND CURETTAGE /HYSTEROSCOPY;  Surgeon: Anastasio Auerbach, MD;  Location: [facility];  Service: Gynecology;  Laterality: N/A;   Current Outpatient Prescriptions on File Prior to Visit  Medication Sig Dispense Refill  . acetaminophen (TYLENOL) 500 MG tablet Take 500 mg by mouth every 6 (six) hours as needed for pain.     Marland Kitchen amLODipine (NORVASC) 10 MG tablet TAKE 1 TABLET BY MOUTH DAILY 30 tablet 10  . BIOTIN FORTE PO Take 1 tablet by mouth daily.    Marland Kitchen CALCIUM PO Take 1 tablet by mouth daily.     . Cholecalciferol (VITAMIN D) 1000 UNITS capsule Take 1,000 Units by mouth daily.     . Cranberry 500 MG CAPS Take 2 capsules by mouth daily.     Marland Kitchen etodolac (LODINE) 500 MG tablet TAKE 1 TABLET BY MOUTH TWICE A DAY 30 tablet 3  . fish oil-omega-3 fatty acids 1000 MG capsule Take 2 g by mouth daily.     Marland Kitchen  glucosamine-chondroitin 500-400 MG tablet Take 1 tablet by mouth daily.    . hydrochlorothiazide (HYDRODIURIL) 25 MG tablet Take 1 tablet (25 mg total) by mouth daily. 90 tablet 3  . levothyroxine (SYNTHROID, LEVOTHROID) 25 MCG tablet TAKE 1 TABLET (25 MCG TOTAL) BY MOUTH DAILY. 30 tablet 11  . loratadine (CLARITIN) 10 MG tablet Take 10 mg by mouth daily.      . Magnesium 100 MG CAPS Take 1 capsule by mouth 2 (two) times daily.     . meclizine (ANTIVERT) 25 MG tablet Take 25 mg by mouth 3 (three) times daily as needed for dizziness.     . Multiple Vitamin (MULTIVITAMIN) capsule Take 1 capsule by mouth daily.      . Polyvinyl Alcohol-Povidone (CLEAR EYES ALL SEASONS OP) Apply 1 drop to eye daily as needed (dry eyes).    . pyridOXINE (VITAMIN B-6) 100 MG tablet Take 100 mg by mouth daily.      . ranitidine (ZANTAC) 150 MG tablet Take 150 mg  by mouth daily.     . tamoxifen (NOLVADEX) 20 MG tablet TAKE 1 TABLET (20 MG TOTAL) BY MOUTH DAILY. 90 tablet 1  . vitamin E 400 UNIT capsule Take 400 Units by mouth daily.       No current facility-administered medications on file prior to visit.   Allergies  Allergen Reactions  . Advair Diskus [Fluticasone-Salmeterol]     Nervous and shakey  . Aspirin     If she takes too much, it burns her stomach  . Bactrim [Sulfamethoxazole-Trimethoprim] Nausea Only  . Demerol Nausea Only  . Meperidine Nausea Only    Sick on Stomach   History   Social History  . Marital Status: Widowed    Spouse Name: N/A  . Number of Children: 0  . Years of Education: N/A   Occupational History  . Retired      Personal assistant   Social History Main Topics  . Smoking status: Never Smoker   . Smokeless tobacco: Never Used  . Alcohol Use: No  . Drug Use: No  . Sexual Activity: Not Currently    Birth Control/ Protection: Post-menopausal   Other Topics Concern  . Not on file   Social History Narrative     Review of Systems  All other systems reviewed and are negative.      Objective:   Physical Exam  Constitutional: She appears well-developed and well-nourished. No distress.  HENT:  Right Ear: External ear normal.  Left Ear: External ear normal.  Nose: Nose normal.  Mouth/Throat: Oropharynx is clear and moist. No oropharyngeal exudate.  Eyes: Conjunctivae and EOM are normal. Pupils are equal, round, and reactive to light. No scleral icterus.  Neck: Neck supple. No thyromegaly present.  Cardiovascular: Normal rate, regular rhythm and normal heart sounds.   Pulmonary/Chest: She has decreased breath sounds. She has wheezes. She has no rhonchi. She has no rales.  Abdominal: Soft. Bowel sounds are normal.  Musculoskeletal: She exhibits no edema.  Lymphadenopathy:    She has no cervical adenopathy.  Skin: No rash noted. She is not diaphoretic.  Vitals reviewed.         Assessment & Plan:    Frequent urination - Plan: Urinalysis, Routine w reflex microscopic (not at [facility])  Essential hypertension - Plan: CBC with Differential/Platelet, COMPLETE METABOLIC PANEL WITH GFR, metoprolol succinate (TOPROL-XL) 25 MG 24 hr tablet  Other specified hypothyroidism - Plan: TSH  BOOP (bronchiolitis obliterans with organizing pneumonia) - Plan: beclomethasone (QVAR)  80 MCG/ACT inhaler regarding her blood pressure, I will add Toprol-XL 25 mg by mouth daily to try to address her elevated blood pressure at home as well as some of her tachycardia. We will need to monitor for any bronchospasm on the beta blocker. Urinalysis shows no evidence of urinary tract infection. Symptomatically the patient states she is better than she fell yesterday. We will monitor this clinically. Given her chronic fatigue, I will check a TSH. I will also screen her cholesterol and a CMP. Given the possible history of BOOP, I'll start the patient on inhaled sterilely to see if I can symptomatically improve her breathing which I'm hoping will help improve her fatigue. Begin Qvar 80 micrograms per actuation, 2 puffs inhaled twice a day recheck in one month

## 2015-05-03 LAB — HM MAMMOGRAPHY: HM MAMMO: NORMAL

## 2015-05-03 LAB — HM DEXA SCAN

## 2015-05-05 ENCOUNTER — Other Ambulatory Visit: Payer: Self-pay | Admitting: Family Medicine

## 2015-05-18 ENCOUNTER — Encounter: Payer: Self-pay | Admitting: Family Medicine

## 2015-05-21 ENCOUNTER — Telehealth: Payer: Self-pay | Admitting: Family Medicine

## 2015-05-21 MED ORDER — ALENDRONATE SODIUM 70 MG PO TABS: 70.0000 mg | ORAL_TABLET | ORAL | Status: DC

## 2015-05-21 NOTE — Telephone Encounter (Signed)
Per Dr. [name] pt has osteoporosis in right hip and recommends Fosamax 70 mg q wk and recheck in 2 years - Patient aware of results and med sent to pharm.

## 2015-06-15 ENCOUNTER — Other Ambulatory Visit: Payer: Self-pay | Admitting: Family Medicine

## 2015-06-22 ENCOUNTER — Encounter: Payer: Self-pay | Admitting: Family Medicine

## 2015-06-22 DIAGNOSIS — M810 Age-related osteoporosis without current pathological fracture: Secondary | ICD-10-CM | POA: Insufficient documentation

## 2015-07-16 ENCOUNTER — Telehealth: Payer: Self-pay | Admitting: Family Medicine

## 2015-07-16 MED ORDER — FLUCONAZOLE 150 MG PO TABS: 150.0000 mg | ORAL_TABLET | Freq: Once | ORAL | Status: DC

## 2015-07-16 NOTE — Telephone Encounter (Signed)
Medication called/sent to requested pharmacy  

## 2015-07-16 NOTE — Telephone Encounter (Signed)
Pt needs a diflucan RX for a yeast infection. [facility] uses the [facility] on [address].

## 2015-09-01 ENCOUNTER — Telehealth: Payer: Self-pay | Admitting: Family Medicine

## 2015-09-01 MED ORDER — FLUCONAZOLE 150 MG PO TABS: 150.0000 mg | ORAL_TABLET | Freq: Once | ORAL | Status: DC

## 2015-09-01 NOTE — Telephone Encounter (Signed)
Medication called/sent to requested pharmacy  

## 2015-09-01 NOTE — Telephone Encounter (Signed)
?   OK to Refill  

## 2015-09-01 NOTE — Telephone Encounter (Signed)
Pt called requesting a refill of diflucan [facility] [address] ph: [phone]

## 2015-09-01 NOTE — Telephone Encounter (Signed)
ok 

## 2015-09-22 ENCOUNTER — Ambulatory Visit: Payer: Medicare Other | Admitting: Adult Health

## 2015-09-24 ENCOUNTER — Other Ambulatory Visit (INDEPENDENT_AMBULATORY_CARE_PROVIDER_SITE_OTHER): Payer: Medicare Other

## 2015-09-24 ENCOUNTER — Encounter: Payer: Self-pay | Admitting: Adult Health

## 2015-09-24 ENCOUNTER — Ambulatory Visit (INDEPENDENT_AMBULATORY_CARE_PROVIDER_SITE_OTHER): Payer: Medicare Other | Admitting: Adult Health

## 2015-09-24 ENCOUNTER — Telehealth: Payer: Self-pay | Admitting: Pulmonary Disease

## 2015-09-24 ENCOUNTER — Ambulatory Visit (INDEPENDENT_AMBULATORY_CARE_PROVIDER_SITE_OTHER)
Admission: RE | Admit: 2015-09-24 | Discharge: 2015-09-24 | Disposition: A | Discharge status: Home / Self Care | Payer: Medicare Other | Source: Ambulatory Visit | Attending: Adult Health | Admitting: Adult Health

## 2015-09-24 VITALS — HR 77 | Ht 61.0 in | Wt 201.0 lb

## 2015-09-24 DIAGNOSIS — R05 Cough: Secondary | ICD-10-CM

## 2015-09-24 DIAGNOSIS — J209 Acute bronchitis, unspecified: Secondary | ICD-10-CM

## 2015-09-24 DIAGNOSIS — R059 Cough, unspecified: Secondary | ICD-10-CM

## 2015-09-24 DIAGNOSIS — Z23 Encounter for immunization: Secondary | ICD-10-CM

## 2015-09-24 LAB — BRAIN NATRIURETIC PEPTIDE: PRO B NATRI PEPTIDE: 109 pg/mL — AB (ref 0.0–100.0)

## 2015-09-24 LAB — BASIC METABOLIC PANEL
BUN: 17 mg/dL (ref 6–23)
CALCIUM: 9.3 mg/dL (ref 8.4–10.5)
CHLORIDE: 103 meq/L (ref 96–112)
CO2: 29 meq/L (ref 19–32)
CREATININE: 0.93 mg/dL (ref 0.40–1.20)
GFR: 62.45 mL/min (ref >60.00)
GLUCOSE: 96 mg/dL (ref 70–99)
Potassium: 4.1 mEq/L (ref 3.5–5.1)
Sodium: 138 mEq/L (ref 135–145)

## 2015-09-24 LAB — SEDIMENTATION RATE: Sed Rate: 20 mm/hr (ref 0–22)

## 2015-09-24 MED ORDER — AMOXICILLIN-POT CLAVULANATE 875-125 MG PO TABS: 1.0000 | ORAL_TABLET | Freq: Two times a day (BID) | ORAL | Status: AC

## 2015-09-24 MED ORDER — PREDNISONE 10 MG PO TABS: ORAL_TABLET | ORAL | Status: DC

## 2015-09-24 NOTE — Addendum Note (Signed)
Addended by: Levander Campion on: 09/24/2015 02:50 PM   Modules accepted: Orders

## 2015-09-24 NOTE — Progress Notes (Signed)
Subjective:    Patient ID: Joan Weaver, female    DOB: 1941-09-02, 74 y.o.   MRN: [id number]  HPI Oncologist : Truddie Coco, Dr Lisbeth Renshaw   [name], never smoker for FU of episodes of pneumonitis in different lung zones - favor aspiration due to esophageal dysmotility Diagnosed with breast CA in July '11, underwent lumpectomy ([facility]) followed by RT , last nov'11.  She presented with pneumonia since Feb '12 not responding to antibiotics.  Transbronchial biopsy 3/12 c/w with organizing pna -neg for BOOP, complicated by pneumothorax  On steroids on & off since 3/12 -off x 5 wks 4/13  Serial CT chest 3/12, 10/12 showed Multiple shifting areas of consolidation in both lungs. Areas  of consolidation seen on the prior chest CT have resolved and there are new, similar appearing areas of predominately peripheral consolidation. 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 Severe esophageal dysmotility. As a result, a large amount of the ingested liquid remained in the esophagus throughout the examination, possibly placing the patient at risk for aspiration. Intermittent prominence of the cricopharyngeus muscle in the pharynx. No evidence of laryngeal penetration or tracheal aspiration with thin barium liquid.  EGD - Tortuous in the distal esophagus without evidence for stricture. Empiric dilation done- Barrett's, possible at the gastroesophageal junction     09/24/2015 Follow up  Complains over last year with gradual worsening of DOE . Has an intermittent cough with white mucus.  Gets winded if she lies on her left side.  Has not been seen for last 2 year.  Says she has had no episodes of PNA for last 2 year.  Weight is up ~15lbs.  Feels her cough and congestion are getting worse.  She denies  any hemoptysis, chest pain, or increased leg swelling. No fever.   Previous Breast cancer on left s/p lumpectomy and XRT.  Last mammogram 02/2015 was nml .  Finished 5 yrs of tamoxifen    Past Medical History  Diagnosis Date  . Thyroid disease   . Osteoporosis   . Hernia   . Cataract   . Ulcer   . Urinary incontinence   . Hypertension     no meds x 3 yrs  . Heart murmur     slight murmur -never had any problems  . Hypothyroidism   . 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 H/O bladder infections   . GERD (gastroesophageal reflux disease)     minor - tx with zantac  . Headache(784.0)     otc med prn  . Breast cancer [facility]) August 2011    left - lumpectomy  . Arthritis     knees, hips   Current Outpatient Prescriptions on File Prior to Visit  Medication Sig Dispense Refill  . acetaminophen (TYLENOL) 500 MG tablet Take 500 mg by mouth every 6 (six) hours as needed for pain.     Marland Kitchen alendronate (FOSAMAX) 70 MG tablet Take 1 tablet (70 mg total) by mouth every 7 (seven) days. Take with a full glass of water on an empty stomach. 4 tablet 11  . BIOTIN FORTE PO Take 1 tablet by mouth daily.    Marland Kitchen CALCIUM PO Take 1 tablet by mouth daily.     . Cholecalciferol (VITAMIN  D) 1000 UNITS capsule Take 1,000 Units by mouth daily.     . Cranberry 500 MG CAPS Take 2 capsules by mouth daily.     Marland Kitchen etodolac (LODINE) 500 MG tablet TAKE 1 TABLET BY MOUTH TWICE A DAY 30 tablet 5  . fish oil-omega-3 fatty acids 1000 MG capsule Take 2 g by mouth daily.     . fluconazole (DIFLUCAN) 150 MG tablet Take 1 tablet (150 mg total) by mouth once. 1 tablet 0  . glucosamine-chondroitin 500-400 MG tablet Take 1 tablet by mouth daily.    Marland Kitchen levothyroxine (SYNTHROID, LEVOTHROID) 25 MCG tablet TAKE 1 TABLET EVERY DAY 30 tablet 10  . loratadine (CLARITIN) 10 MG tablet Take 10 mg by mouth daily.      . Magnesium 100 MG CAPS Take 1 capsule by mouth 2 (two) times daily.     .  meclizine (ANTIVERT) 25 MG tablet Take 25 mg by mouth 3 (three) times daily as needed for dizziness.     . metoprolol succinate (TOPROL-XL) 25 MG 24 hr tablet Take 1 tablet (25 mg total) by mouth daily. 90 tablet 3  . Multiple Vitamin (MULTIVITAMIN) capsule Take 1 capsule by mouth daily.      . Polyvinyl Alcohol-Povidone (CLEAR EYES ALL SEASONS OP) Apply 1 drop to eye daily as needed (dry eyes).    . pyridOXINE (VITAMIN B-6) 100 MG tablet Take 100 mg by mouth daily.      . ranitidine (ZANTAC) 150 MG tablet Take 150 mg by mouth daily.     . vitamin E 400 UNIT capsule Take 400 Units by mouth daily.       No current facility-administered medications on file prior to visit.       Review of Systemsneg for any significant sore throat, dysphagia, itching, sneezing, nasal congestion or excess/ purulent secretions, fever, chills, sweats, unintended wt loss, pleuritic or exertional cp, hempoptysis, orthopnea pnd or change in chronic leg swelling. Also denies presyncope, palpitations, heartburn, abdominal pain, nausea, vomiting, diarrhea or change in bowel or urinary habits, dysuria,hematuria, rash, arthralgias, visual complaints, headache, numbness weakness or ataxia.     Objective:   Physical Exam Filed Vitals:   09/24/15 1410  Pulse: 77  Height: 5\' 1"  (1.549 m)  Weight: 201 lb (91.173 kg)  SpO2: 97%    Gen. Pleasant, obese , barking cough  ENT - no lesions, no post nasal drip Neck: No JVD, no thyromegaly, no carotid bruits Lungs: no use of accessory muscles, no dullness to percussion, clear without rales or rhonchi  Cardiovascular: Rhythm regular, heart sounds  normal, no murmurs or gallops, no peripheral edema Musculoskeletal: No deformities, no cyanosis or clubbing         Assessment & Plan:

## 2015-09-24 NOTE — Patient Instructions (Addendum)
Augmentin 875mg  Twice daily  - to have on hold if symptoms worsen with discolored mucus.  Prednisone taper over next week.  Chest xray today  Flu shot today .  Mucinex Twice daily  As needed  Cough/congestion  Delsym tsp Twice daily  As needed  Cough  GERD diet.  Increase Zantac Twice daily  .  Labs today .  Follow up Dr. Elsworth Soho  In 6 weeks with PFT and As needed   Please contact office for sooner follow up if symptoms do not improve or worsen or seek emergency care

## 2015-09-24 NOTE — Assessment & Plan Note (Signed)
?  Flare with hx of pnuemonitis . Hx of possible aspiration from esophageal dysmotility .  Check cxr and labs with ESR/BNP  Check pft on return   Plan  Augmentin '875mg'$  Twice daily  - to have on hold if symptoms worsen with discolored mucus.  Prednisone taper over next week.  Chest xray today  Flu shot today .  Mucinex Twice daily  As needed  Cough/congestion  Delsym tsp Twice daily  As needed  Cough  GERD diet.  Increase Zantac Twice daily  .  Labs today .  Follow up Dr. Elsworth Soho  In 6 weeks with PFT and As needed   Please contact office for sooner follow up if symptoms do not improve or worsen or seek emergency care

## 2015-09-24 NOTE — Telephone Encounter (Signed)
Sharyn Lull please advise if this pt can be worked in somewhere in late Jan/early Feb.  Thanks!

## 2015-09-27 NOTE — Progress Notes (Signed)
Quick Note:  Called and spoke with patient. Reviewed results and recs. Patient voiced understanding and had no further questions. ______ 

## 2015-09-27 NOTE — Progress Notes (Signed)
Quick Note:  Called and spoke with patient. Reviewed results and recs. Pt voiced understanding and had no further questions. ______ 

## 2015-09-28 NOTE — Telephone Encounter (Signed)
Sharyn Lull please advise. Thanks.

## 2015-09-28 NOTE — Telephone Encounter (Signed)
Dr. Elsworth Soho, next available appt is March 27th.   Will need to doublebook to get patient on schedule in Jan/Feb.   Please advise.

## 2015-10-11 NOTE — Telephone Encounter (Signed)
Place patient on call list- I with open additional office times 

## 2015-10-12 NOTE — Telephone Encounter (Signed)
Placed on call list, will schedule when RA opens clinic. Nothing further needed.

## 2015-10-12 NOTE — Progress Notes (Signed)
Reviewed & agree with plan  

## 2015-10-28 ENCOUNTER — Ambulatory Visit (INDEPENDENT_AMBULATORY_CARE_PROVIDER_SITE_OTHER): Payer: Medicare Other | Admitting: Pulmonary Disease

## 2015-10-28 DIAGNOSIS — J209 Acute bronchitis, unspecified: Secondary | ICD-10-CM

## 2015-10-28 LAB — PULMONARY FUNCTION TEST
DL/VA % pred: 129 %
DL/VA: 5.59 ml/min/mmHg/L
DLCO unc % pred: 107 %
DLCO unc: 21.08 ml/min/mmHg
FEF 25-75 POST: 2.53 L/s
FEF 25-75 PRE: 2.41 L/s
FEF2575-%CHANGE-POST: 4 %
FEF2575-%PRED-POST: 174 %
FEF2575-%PRED-PRE: 166 %
FEV1-%Change-Post: 1 %
FEV1-%PRED-POST: 114 %
FEV1-%Pred-Pre: 112 %
FEV1-POST: 2.03 L
FEV1-Pre: 2 L
FEV1FVC-%Change-Post: 4 %
FEV1FVC-%PRED-PRE: 114 %
FEV6-%CHANGE-POST: -2 %
FEV6-%PRED-POST: 100 %
FEV6-%Pred-Pre: 103 %
FEV6-Post: 2.27 L
FEV6-Pre: 2.33 L
FEV6FVC-%Pred-Post: 105 %
FEV6FVC-%Pred-Pre: 105 %
FVC-%CHANGE-POST: -2 %
FVC-%Pred-Post: 94 %
FVC-%Pred-Pre: 97 %
FVC-Post: 2.27 L
FVC-Pre: 2.33 L
POST FEV1/FVC RATIO: 90 %
Post FEV6/FVC ratio: 100 %
Pre FEV1/FVC ratio: 86 %
Pre FEV6/FVC Ratio: 100 %

## 2015-10-28 NOTE — Progress Notes (Signed)
PFT done today. 

## 2015-11-01 ENCOUNTER — Ambulatory Visit (INDEPENDENT_AMBULATORY_CARE_PROVIDER_SITE_OTHER): Payer: Medicare Other | Admitting: Pulmonary Disease

## 2015-11-01 ENCOUNTER — Other Ambulatory Visit (INDEPENDENT_AMBULATORY_CARE_PROVIDER_SITE_OTHER): Payer: Medicare Other

## 2015-11-01 ENCOUNTER — Encounter: Payer: Self-pay | Admitting: Pulmonary Disease

## 2015-11-01 VITALS — BP 138/80 | HR 88 | Ht 60.5 in | Wt 200.6 lb

## 2015-11-01 DIAGNOSIS — J984 Other disorders of lung: Secondary | ICD-10-CM | POA: Diagnosis not present

## 2015-11-01 DIAGNOSIS — K224 Dyskinesia of esophagus: Secondary | ICD-10-CM

## 2015-11-01 LAB — SEDIMENTATION RATE: Sed Rate: 18 mm/hr (ref 0–22)

## 2015-11-01 LAB — RHEUMATOID FACTOR: Rhuematoid fact SerPl-aCnc: <10 IU/mL (ref <=14)

## 2015-11-01 NOTE — Assessment & Plan Note (Signed)
Lung function shows low TLC, no obs Pulmonary rehab referral Ambulatory satn Symptoms have been steroid responsive & fleeting infx may be recurrent aspiration vs BOOP Trial of Flovent 44 2 puffs daily- rinse mouth after use If no relief, call back & we will Rx low dose prednisone Blood work today

## 2015-11-01 NOTE — Patient Instructions (Signed)
Lung function seems OK Pulmonary rehab referral Ambulatory satn Trial of Flovent 44 2 puffs daily- rinse mouth after use If no relief, call back & we will Rx low dose prednisone Blood work today

## 2015-11-01 NOTE — Progress Notes (Signed)
Subjective:    Patient ID: Joan Weaver, female    DOB: [birth year], 75 y.o.   MRN: [id number]  HPI   75/F, never smoker for FU of episodes of pneumonitis in different lung zones - favor recurrent aspiration due to esophageal dysmotility rather than BOOP Diagnosed with breast CA in July '11, underwent lumpectomy ([facility]) followed by RT , last nov 2011. She attributes her problems to this.  She has a history of Nissen's fundoplication in [id number]     11/01/2015  Chief Complaint  Patient presents with  . Follow-up    6 wk f/u after PFT - SOB with any exertion (bathing, washing dishes) - occas wheezing - occas prod cough in am (white)   Symptoms returned after 2 years and she was seen in follow-up in 09/2015 C/o audible wheezing after walking Qvar gave her headache Thinks she has RA - sees ortho dr Erlinda Hong Prednisone provides quick relief, causes wt gain Reviewed PFTs  Previous Breast cancer on left s/p lumpectomy and XRT.  Last mammogram 02/2015 was nml .  Finished 5 yrs of tamoxifen   I personally reviewed  imaging studies and compared to old films   Significant tests/ events  She presented with pneumonia since Feb '12 not responding to antibiotics.  Transbronchial biopsy 12/2010 c/w with organizing pna -neg for BOOP, complicated by pneumothorax  On steroids on & off since 3/20 12 Serial CT chest 12/2010, 07/2011 showed Multiple shifting areas of consolidation in both lungs. Areas of consolidation seen on the prior chest CT have resolved and there are new, similar appearing areas of predominately peripheral consolidation. 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 Severe esophageal dysmotility.   EGD 03/2012 - Tortuous in the distal esophagus without evidence for stricture. Empiric dilation done- Barrett's, possible at the gastroesophageal  junction  Esophageal manometry 03/2013 ([facility]) - LES pressure normal, failed peristalsis ?  PFTs - 10/2015 - no obs, TLC 71%, DLCO nml  Past Medical History  Diagnosis Date  . Thyroid disease   . Osteoporosis   . Hernia   . Cataract   . Ulcer   . Urinary incontinence   . Hypertension     no meds x 3 yrs  . Heart murmur     slight murmur -never had any problems  . Hypothyroidism   . 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 H/O bladder infections   . GERD (gastroesophageal reflux disease)     minor - tx with zantac  . Headache(784.0)     otc med prn  . Breast cancer [facility]) August 2011    left - lumpectomy  . Arthritis     knees, hips    Review of Systems neg for any significant sore throat, dysphagia, itching, sneezing, nasal congestion or excess/ purulent secretions, fever, chills, sweats, unintended wt loss, pleuritic or exertional cp, hempoptysis, orthopnea pnd or change in chronic leg swelling.  Also denies presyncope, palpitations, heartburn, abdominal pain, nausea, vomiting, diarrhea or change in bowel or urinary habits, dysuria,hematuria, rash, arthralgias, visual complaints, headache, numbness weakness or ataxia.     Objective:   Physical Exam  Gen. Pleasant, obese, in no distress ENT - no lesions, no post nasal drip Neck: No JVD, no thyromegaly, no carotid bruits Lungs: no use of accessory muscles, no dullness  to percussion, decreased without rales or rhonchi  Cardiovascular: Rhythm regular, heart sounds  normal, no murmurs or gallops, no peripheral edema Musculoskeletal: No deformities, no cyanosis or clubbing , no tremors        Assessment & Plan:

## 2015-11-01 NOTE — Assessment & Plan Note (Addendum)
Ct ranitidine twice daily Aspiration precautions-has been seen by GI, not sure that there is a solution for this

## 2015-11-02 LAB — ANTI-NUCLEAR AB-TITER (ANA TITER): ANA Titer 1: 1:80 {titer} — ABNORMAL HIGH

## 2015-11-02 LAB — CYCLIC CITRUL PEPTIDE ANTIBODY, IGG: Cyclic Citrullin Peptide Ab: <16 Units

## 2015-11-02 LAB — ANA: ANA: POSITIVE — AB

## 2015-11-05 ENCOUNTER — Telehealth: Payer: Self-pay | Admitting: Family Medicine

## 2015-11-05 MED ORDER — ETODOLAC 500 MG PO TABS: 500.0000 mg | ORAL_TABLET | Freq: Two times a day (BID) | ORAL | Status: DC

## 2015-11-05 NOTE — Telephone Encounter (Signed)
Medication called/sent to requested pharmacy  

## 2015-11-05 NOTE — Telephone Encounter (Signed)
Patient switching pharmacies and needs rx sent to [facility] [address] for her etodolac if possible

## 2015-11-09 ENCOUNTER — Telehealth: Payer: Self-pay | Admitting: *Deleted

## 2015-11-09 DIAGNOSIS — R768 Other specified abnormal immunological findings in serum: Secondary | ICD-10-CM

## 2015-11-09 NOTE — Telephone Encounter (Signed)
-----   Message from Rigoberto Noel, MD sent at 11/03/2015  1:25 PM EST ----- Her tests for rheumatoid arthritis is negative She has positive ANA- we can refer her to a rheumatologist if she is willing

## 2015-11-22 ENCOUNTER — Telehealth: Payer: Self-pay | Admitting: Pulmonary Disease

## 2015-11-22 NOTE — Telephone Encounter (Signed)
Patient calling to check on referral to Dr. Kirke Corin office.   Reviewed notes on Referral:   Type Date User   General 11/16/2015 1:51 PM MCMICHAEL, SHERRY P        Note   Called Dr Arlean Hopping office & spoke to [place]. She states they received the referral from [place] & Dr Estanislado Pandy is reviewing it now. I told her I would check back next week to see if they will be able to see the pt     Judeen Hammans:  Please advise if you have heard back from Dr. Kirke Corin office yet?  Patient has not heard anything.

## 2015-11-23 NOTE — Telephone Encounter (Signed)
I called Dr Arlean Hopping office on 2/7 & was told they received our referral & Dr Estanislado Pandy is reviewing now.  I called back today & left vm requesting call back regarding referral.

## 2015-11-23 NOTE — Telephone Encounter (Signed)
Called Dr Arlean Hopping office & spoke to [place]. She states Dr Estanislado Pandy signed off on the referral but did not indicate if she would see the pt or not. They had to send it back to her to let them know.  Joan Weaver states she should know something tomorrow & will call me back. I call the pt & explained to her their process & how we still didn't know if they would be able to see her or not.  I told her I would let her know as soon as I hear back from them.

## 2015-11-25 NOTE — Telephone Encounter (Signed)
I spoke to [facility] at Dr Arlean Hopping office & they are going to see pt. She states they called pt & left her vm to call them back.  I called pt & she had not heard from them but I gave her their # & she is going to call them back to schedule appt.

## 2015-12-13 ENCOUNTER — Encounter: Payer: Self-pay | Admitting: Adult Health

## 2015-12-13 ENCOUNTER — Ambulatory Visit (INDEPENDENT_AMBULATORY_CARE_PROVIDER_SITE_OTHER): Payer: Medicare Other | Admitting: Adult Health

## 2015-12-13 VITALS — BP 138/80 | HR 67 | Temp 97.3°F | Ht 60.0 in | Wt 201.0 lb

## 2015-12-13 DIAGNOSIS — J984 Other disorders of lung: Secondary | ICD-10-CM

## 2015-12-13 NOTE — Assessment & Plan Note (Signed)
Recurrent aspiration vs BOOP , appears to be steroid responsive.  Recent flare now resolved Cont on current regimen

## 2015-12-13 NOTE — Progress Notes (Signed)
Subjective:    Patient ID: Joan Weaver, female    DOB: May 10, 1941, 75 y.o.   MRN: [id number]  HPI Oncologist : Joan Weaver, Dr Joan Weaver   [name], never smoker for FU of episodes of pneumonitis in different lung zones - favor aspiration due to esophageal dysmotility Diagnosed with breast CA in July '11, underwent lumpectomy ([facility]) followed by RT , last nov'11.  She presented with pneumonia since Feb '12 not responding to antibiotics.  Transbronchial biopsy 3/12 c/w with organizing pna -neg for BOOP, complicated by pneumothorax  On steroids on & off since 3/12 -off x 5 wks 4/13  Serial CT chest 3/12, 10/12 showed Multiple shifting areas of consolidation in both lungs. Areas  of consolidation seen on the prior chest CT have resolved and there are new, similar appearing areas of predominately peripheral consolidation. 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 Severe esophageal dysmotility. As a result, a large amount of the ingested liquid remained in the esophagus throughout the examination, possibly placing the patient at risk for aspiration. Intermittent prominence of the cricopharyngeus muscle in the pharynx. No evidence of laryngeal penetration or tracheal aspiration with thin barium liquid.  EGD - Tortuous in the distal esophagus without evidence for stricture. Empiric dilation done- Barrett's, possible at the gastroesophageal junction  PFTs - 10/2015 - no obs, TLC 71%, DLCO nml    12/13/2015 Follow up : Pneumonitis Steroid responsive  Pt returns for 6 week follow up .  She had recent bronchitic flare , tx w/ steroids and abx. She is feeling better with less cough .  PFT last month showed no obstruction. nml DLCO .  Labs showed neg RA , but positive ANA . Has ov with Rheumatology  next week.  She says she is feeling better . Has baseline mild intermittent cough. Gets winded with activity.  Denies chest pain, orthopnea, edema or fever.   Previous Breast cancer on left s/p lumpectomy and XRT.  Last mammogram 02/2015 was nml .  Finished 5 yrs of tamoxifen     Past Medical History  Diagnosis Date  . Thyroid disease   . Osteoporosis   . Hernia   . Cataract   . Ulcer   . Urinary incontinence   . Hypertension     no meds x 3 yrs  . Heart murmur     slight murmur -never had any problems  . Hypothyroidism   . 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 H/O bladder infections   . GERD (gastroesophageal reflux disease)     minor - tx with zantac  . Headache(784.0)     otc med prn  . Breast cancer [facility]) August 2011    left - lumpectomy  . Arthritis     knees, hips   Current Outpatient Prescriptions on File Prior to Visit  Medication Sig Dispense Refill  . acetaminophen (TYLENOL) 500 MG tablet Take 500 mg by mouth every 6 (six) hours as needed for pain.     Marland Kitchen BIOTIN FORTE PO Take 1 tablet by mouth daily.    Marland Kitchen CALCIUM PO Take 1 tablet by mouth daily.     . Cholecalciferol (VITAMIN D) 1000 UNITS capsule Take 1,000 Units by mouth daily.     Marland Kitchen  Cranberry 500 MG CAPS Take 2 capsules by mouth daily.     Marland Kitchen etodolac (LODINE) 500 MG tablet Take 1 tablet (500 mg total) by mouth 2 (two) times daily. 30 tablet 5  . fish oil-omega-3 fatty acids 1000 MG capsule Take 2 g by mouth daily.     Marland Kitchen glucosamine-chondroitin 500-400 MG tablet Take 1 tablet by mouth daily.    Marland Kitchen levothyroxine (SYNTHROID, LEVOTHROID) 25 MCG tablet TAKE 1 TABLET EVERY DAY 30 tablet 10  . loratadine (CLARITIN) 10 MG tablet Take 10 mg by mouth daily.      . Magnesium 100 MG CAPS Take 1 capsule by mouth 2 (two) times daily.     . meclizine (ANTIVERT) 25 MG tablet Take 25 mg by mouth 3 (three) times daily as needed for dizziness.     . metoprolol succinate (TOPROL-XL) 25  MG 24 hr tablet Take 1 tablet (25 mg total) by mouth daily. 90 tablet 3  . Multiple Vitamin (MULTIVITAMIN) capsule Take 1 capsule by mouth daily.      . Polyvinyl Alcohol-Povidone (CLEAR EYES ALL SEASONS OP) Apply 1 drop to eye daily as needed (dry eyes).    . pyridOXINE (VITAMIN B-6) 100 MG tablet Take 100 mg by mouth daily.      . ranitidine (ZANTAC) 150 MG tablet Take 150 mg by mouth daily.     . vitamin E 400 UNIT capsule Take 400 Units by mouth daily.       No current facility-administered medications on file prior to visit.       Review of Systemsneg for any significant sore throat, dysphagia, itching, sneezing, nasal congestion or excess/ purulent secretions, fever, chills, sweats, unintended wt loss, pleuritic or exertional cp, hempoptysis, orthopnea pnd or change in chronic leg swelling. Also denies presyncope, palpitations, heartburn, abdominal pain, nausea, vomiting, diarrhea or change in bowel or urinary habits, dysuria,hematuria, rash, arthralgias, visual complaints, headache, numbness weakness or ataxia.     Objective:   Physical Exam Filed Vitals:   12/13/15 1429  BP: 138/80  Pulse: 67  Temp: 97.3 F (36.3 C)  TempSrc: Oral  Height: 5' (1.524 m)  Weight: 201 lb (91.173 kg)  SpO2: 97%    Gen. Pleasant, obese  Nad.  ENT - no lesions, no post nasal drip Neck: No JVD, no thyromegaly, no carotid bruits Lungs: no use of accessory muscles, no dullness to percussion, clear without rales or rhonchi  Cardiovascular: Rhythm regular, heart sounds  normal, no murmurs or gallops, no peripheral edema Musculoskeletal: No deformities, no cyanosis or clubbing   Joan Taite NP-C  [facility]  12/13/2015       Assessment & Plan:

## 2015-12-13 NOTE — Patient Instructions (Addendum)
Continue on current regimen .  Follow up with Rheumatology as planned  Follow up with Dr. Elsworth Soho  In 4 months and As needed

## 2015-12-14 NOTE — Progress Notes (Signed)
Reviewed & agree with plan  

## 2015-12-20 ENCOUNTER — Ambulatory Visit (INDEPENDENT_AMBULATORY_CARE_PROVIDER_SITE_OTHER): Payer: Medicare Other | Admitting: Family Medicine

## 2015-12-20 ENCOUNTER — Encounter: Payer: Self-pay | Admitting: Family Medicine

## 2015-12-20 VITALS — BP 145/93 | HR 84 | Temp 97.9°F | Resp 22 | Wt 202.0 lb

## 2015-12-20 DIAGNOSIS — I10 Essential (primary) hypertension: Secondary | ICD-10-CM

## 2015-12-20 DIAGNOSIS — R300 Dysuria: Secondary | ICD-10-CM

## 2015-12-20 LAB — URINALYSIS, ROUTINE W REFLEX MICROSCOPIC
Glucose, UA: NEGATIVE
Hgb urine dipstick: NEGATIVE
Nitrite: POSITIVE — AB
Specific Gravity, Urine: 1.025 (ref 1.001–1.035)
pH: 5.5 (ref 5.0–8.0)

## 2015-12-20 LAB — URINALYSIS, MICROSCOPIC ONLY
Casts: NONE SEEN [LPF]
Crystals: NONE SEEN [HPF]
Yeast: NONE SEEN [HPF]

## 2015-12-20 MED ORDER — AMLODIPINE BESYLATE 10 MG PO TABS: 10.0000 mg | ORAL_TABLET | Freq: Every day | ORAL | Status: DC

## 2015-12-20 MED ORDER — CEPHALEXIN 500 MG PO CAPS: 500.0000 mg | ORAL_CAPSULE | Freq: Three times a day (TID) | ORAL | Status: DC

## 2015-12-20 NOTE — Progress Notes (Signed)
Subjective:    Patient ID: Joan Weaver, female    DOB: Aug 20, 1941, 75 y.o.   MRN: [id number]  HPI  Patient reports a one-week history of dysuria, urgency, frequency, and hesitancy urinalysis today shows leukocyte esterase, as well as white blood cells and bacteria in the urine sample. Her blood pressure at home has been averaging between 140 and 160/80-90. I personally checked her blood pressure here today and found it to match her machine. Therefore blood pressure is elevated.. She denies any chest pain shortness of breath or dyspnea on exertion Past Medical History  Diagnosis Date  . Thyroid disease   . Osteoporosis   . Hernia   . Cataract   . Ulcer   . Urinary incontinence   . Hypertension     no meds x 3 yrs  . Heart murmur     slight murmur -never had any problems  . Hypothyroidism   . 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 H/O bladder infections   . GERD (gastroesophageal reflux disease)     minor - tx with zantac  . Headache(784.0)     otc med prn  . Breast cancer [facility]) August 2011    left - lumpectomy  . Arthritis     knees, hips   Past Surgical History  Procedure Laterality Date  . Hiatal hernia repair      x 2  . Tonsillectomy    . Appendectomy    . Left cataract surgery    . Breast lumpectomy      left  . Knee surgery      Arthroscopic  . Eye surgery      left- cataract   . Hysteroscopy w/d&c N/A 07/07/2013    Procedure: DILATATION AND CURETTAGE /HYSTEROSCOPY;  Surgeon: Anastasio Auerbach, MD;  Location: [facility];  Service: Gynecology;  Laterality: N/A;   Current Outpatient Prescriptions on File Prior to Visit  Medication Sig Dispense Refill  . acetaminophen (TYLENOL) 500 MG tablet Take 500 mg by mouth every 6 (six) hours as needed for pain.     Marland Kitchen BIOTIN FORTE PO Take 1 tablet by mouth daily.    Marland Kitchen CALCIUM PO Take 1 tablet by mouth daily.     . Cholecalciferol (VITAMIN D) 1000 UNITS capsule Take 1,000 Units by  mouth daily.     . Cranberry 500 MG CAPS Take 2 capsules by mouth daily.     Marland Kitchen etodolac (LODINE) 500 MG tablet Take 1 tablet (500 mg total) by mouth 2 (two) times daily. 30 tablet 5  . fish oil-omega-3 fatty acids 1000 MG capsule Take 2 g by mouth daily.     Marland Kitchen glucosamine-chondroitin 500-400 MG tablet Take 1 tablet by mouth daily.    Marland Kitchen levothyroxine (SYNTHROID, LEVOTHROID) 25 MCG tablet TAKE 1 TABLET EVERY DAY 30 tablet 10  . loratadine (CLARITIN) 10 MG tablet Take 10 mg by mouth daily.      . Magnesium 100 MG CAPS Take 1 capsule by mouth 2 (two) times daily.     . meclizine (ANTIVERT) 25 MG tablet Take 25 mg by mouth 3 (three) times daily as needed for dizziness.     . metoprolol succinate (TOPROL-XL) 25 MG 24 hr tablet Take 1 tablet (25 mg total) by mouth daily. 90 tablet 3  . Multiple Vitamin (MULTIVITAMIN) capsule Take 1 capsule by mouth daily.      . Polyvinyl Alcohol-Povidone (CLEAR EYES ALL  SEASONS OP) Apply 1 drop to eye daily as needed (dry eyes).    . pyridOXINE (VITAMIN B-6) 100 MG tablet Take 100 mg by mouth daily.      . ranitidine (ZANTAC) 150 MG tablet Take 150 mg by mouth daily.     . vitamin E 400 UNIT capsule Take 400 Units by mouth daily.       No current facility-administered medications on file prior to visit.   Allergies  Allergen Reactions  . Advair Diskus [Fluticasone-Salmeterol]     Nervous and shakey  . Aspirin     If she takes too much, it burns her stomach  . Bactrim [Sulfamethoxazole-Trimethoprim] Nausea Only  . Demerol Nausea Only  . Meperidine Nausea Only    Sick on Stomach  . Qvar [Beclomethasone] Other (See Comments)    Headache   Social History   Social History  . Marital Status: Widowed    Spouse Name: N/A  . Number of Children: 0  . Years of Education: N/A   Occupational History  . Retired      Personal assistant   Social History Main Topics  . Smoking status: Never Smoker   . Smokeless tobacco: Never Used  . Alcohol Use: No  . Drug Use: No    . Sexual Activity: Not Currently    Birth Control/ Protection: Post-menopausal   Other Topics Concern  . Not on file   Social History Narrative     Review of Systems  All other systems reviewed and are negative.      Objective:   Physical Exam  Constitutional: She appears well-developed and well-nourished.  Cardiovascular: Normal rate, regular rhythm and normal heart sounds.   Pulmonary/Chest: Effort normal and breath sounds normal. No respiratory distress. She has no wheezes. She has no rales.  Abdominal: Soft. Bowel sounds are normal.  Vitals reviewed.         Assessment & Plan:  Burning with urination - Plan: Urinalysis, Routine w reflex microscopic (not at [facility]), cephALEXin (KEFLEX) 500 MG capsule  Benign essential HTN - Plan: amLODipine (NORVASC) 10 MG tablet  Patient appears to have urinary tract infection. Begin Keflex 500 mg by mouth 3 times a day for 7 days. Add Norvasc 10 mg by mouth daily for hypertension and recheck her blood pressure in one month

## 2015-12-27 DIAGNOSIS — J8489 Other specified interstitial pulmonary diseases: Secondary | ICD-10-CM | POA: Diagnosis not present

## 2015-12-27 DIAGNOSIS — M25551 Pain in right hip: Secondary | ICD-10-CM | POA: Diagnosis not present

## 2015-12-27 DIAGNOSIS — M79642 Pain in left hand: Secondary | ICD-10-CM | POA: Diagnosis not present

## 2015-12-27 DIAGNOSIS — M2550 Pain in unspecified joint: Secondary | ICD-10-CM | POA: Diagnosis not present

## 2015-12-27 DIAGNOSIS — M170 Bilateral primary osteoarthritis of knee: Secondary | ICD-10-CM | POA: Diagnosis not present

## 2015-12-31 ENCOUNTER — Telehealth: Payer: Self-pay | Admitting: Family Medicine

## 2015-12-31 NOTE — Telephone Encounter (Signed)
Repeat urine cx on Monday

## 2015-12-31 NOTE — Telephone Encounter (Signed)
Pt has finished the abx prescribed by Dr. [name]. Her UTI is better but not completely gone. Please advise pt if she will need to be re-evaluated [phone]

## 2016-01-03 ENCOUNTER — Other Ambulatory Visit: Payer: Medicare Other

## 2016-01-03 ENCOUNTER — Other Ambulatory Visit: Payer: Self-pay | Admitting: Family Medicine

## 2016-01-03 DIAGNOSIS — N390 Urinary tract infection, site not specified: Secondary | ICD-10-CM

## 2016-01-03 NOTE — Telephone Encounter (Signed)
Pt aware of recommendation °

## 2016-01-05 LAB — URINE CULTURE: Colony Count: 25000

## 2016-01-06 ENCOUNTER — Other Ambulatory Visit: Payer: Self-pay | Admitting: Family Medicine

## 2016-01-06 MED ORDER — FLUCONAZOLE 150 MG PO TABS: 150.0000 mg | ORAL_TABLET | Freq: Once | ORAL | Status: DC

## 2016-01-19 ENCOUNTER — Telehealth: Payer: Self-pay | Admitting: Family Medicine

## 2016-01-19 MED ORDER — MECLIZINE HCL 25 MG PO TABS: 25.0000 mg | ORAL_TABLET | Freq: Three times a day (TID) | ORAL | Status: DC | PRN

## 2016-01-19 NOTE — Telephone Encounter (Signed)
Medication called/sent to requested pharmacy  

## 2016-01-19 NOTE — Telephone Encounter (Signed)
Patient calling requesting a refill called into [facility] at [facility] for [place] for inner ear dizziness.  CB# [phone]

## 2016-02-28 ENCOUNTER — Encounter: Payer: Self-pay | Admitting: Hematology and Oncology

## 2016-02-28 ENCOUNTER — Ambulatory Visit (HOSPITAL_BASED_OUTPATIENT_CLINIC_OR_DEPARTMENT_OTHER): Payer: Medicare Other

## 2016-02-28 ENCOUNTER — Ambulatory Visit (HOSPITAL_BASED_OUTPATIENT_CLINIC_OR_DEPARTMENT_OTHER): Payer: Medicare Other | Admitting: Hematology and Oncology

## 2016-02-28 ENCOUNTER — Telehealth: Payer: Self-pay | Admitting: Hematology and Oncology

## 2016-02-28 VITALS — BP 137/57 | HR 68 | Temp 98.0°F | Resp 18 | Wt 202.7 lb

## 2016-02-28 DIAGNOSIS — N390 Urinary tract infection, site not specified: Secondary | ICD-10-CM | POA: Insufficient documentation

## 2016-02-28 DIAGNOSIS — Z853 Personal history of malignant neoplasm of breast: Secondary | ICD-10-CM | POA: Diagnosis not present

## 2016-02-28 DIAGNOSIS — C50512 Malignant neoplasm of lower-outer quadrant of left female breast: Secondary | ICD-10-CM

## 2016-02-28 LAB — URINALYSIS, MICROSCOPIC - CHCC
Blood: NEGATIVE
Glucose: NEGATIVE mg/dL
KETONES: NEGATIVE mg/dL
NITRITE: POSITIVE
Protein: <30 mg/dL
Specific Gravity, Urine: 1.015 (ref 1.003–1.035)
Urobilinogen, UR: 0.2 mg/dL (ref 0.2–1)
pH: 6 (ref 4.6–8.0)

## 2016-02-28 NOTE — Telephone Encounter (Signed)
Mailed pt appt sched and letter for 37yr

## 2016-02-28 NOTE — Progress Notes (Signed)
Patient Care Team: [name], MD as PCP - General (Family Medicine) Eston Esters, MD as Consulting Physician (Hematology and Oncology)  SUMMARY OF ONCOLOGIC HISTORY:   Breast cancer, left ([place])   05/11/2010 Surgery Left breast lumpectomy: Invasive ductal carcinoma 6 mm, grade 2, margins negative, 0/2 sentinel nodes, ER 98%, PR 97%, HER-2 negative, Ki-67 18%, T1b N0 M0 stage IA   05/30/2010 - 07/06/2010 Radiation Therapy Adjuvant radiation by Dr. Lisbeth Renshaw   07/08/2010 - 12/29/2015 Anti-estrogen oral therapy Tamoxifen 20 mg daily    CHIEF COMPLIANT: Follow-up after completion of tamoxifen therapy  INTERVAL HISTORY: Joan Weaver is a 75 year old with above-mentioned history of left breast cancer currently on surveillance after completing tamoxifen therapy. Lately she has had multiple urinary infections. Today she feels like she is a burning sensation and thinks that she may have a UTI. She denies any lumps or nodules in the breasts.  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  I have reviewed the past medical history, past surgical history, social history and family history with the patient and they are unchanged from previous note.  ALLERGIES:  is allergic to advair diskus; aspirin; bactrim; demerol;  meperidine; and qvar.  MEDICATIONS:  Current Outpatient Prescriptions  Medication Sig Dispense Refill  . acetaminophen (TYLENOL) 500 MG tablet Take 500 mg by mouth every 6 (six) hours as needed for pain.     Marland Kitchen amLODipine (NORVASC) 10 MG tablet Take 1 tablet (10 mg total) by mouth daily. 90 tablet 3  . BIOTIN FORTE PO Take 1 tablet by mouth daily.    Marland Kitchen CALCIUM PO Take 1 tablet by mouth daily.     . cephALEXin (KEFLEX) 500 MG capsule Take 1 capsule (500 mg total) by mouth 3 (three) times daily. 21 capsule 0  . Cholecalciferol (VITAMIN D) 1000 UNITS capsule Take 1,000 Units by mouth daily.     . Cranberry 500 MG CAPS Take 2 capsules by mouth daily.     Marland Kitchen etodolac (LODINE) 500 MG tablet Take 1 tablet (500 mg total) by mouth 2 (two) times daily. 30 tablet 5  . fish oil-omega-3 fatty acids 1000 MG capsule Take 2 g by mouth daily.     . fluconazole (DIFLUCAN) 150 MG tablet Take 1 tablet (150 mg total) by mouth once. 1 tablet 0  . glucosamine-chondroitin 500-400 MG tablet Take 1 tablet by mouth daily.    Marland Kitchen levothyroxine (SYNTHROID, LEVOTHROID) 25 MCG tablet TAKE 1 TABLET EVERY DAY 30 tablet 10  . loratadine (CLARITIN) 10 MG tablet Take 10 mg by mouth daily.      . Magnesium 100 MG CAPS Take 1 capsule by mouth 2 (two) times daily.     . meclizine (ANTIVERT) 25 MG tablet Take 1 tablet (25 mg total) by mouth 3 (three) times daily as needed for dizziness. 30 tablet 1  . metoprolol succinate (TOPROL-XL) 25 MG 24 hr tablet Take 1  tablet (25 mg total) by mouth daily. 90 tablet 3  . Multiple Vitamin (MULTIVITAMIN) capsule Take 1 capsule by mouth daily.      . Polyvinyl Alcohol-Povidone (CLEAR EYES ALL SEASONS OP) Apply 1 drop to eye daily as needed (dry eyes).    . pyridOXINE (VITAMIN B-6) 100 MG tablet Take 100 mg by mouth daily.      . ranitidine (ZANTAC) 150 MG tablet Take 150 mg by mouth daily.     . vitamin E 400 UNIT capsule Take 400 Units by mouth daily.       No current facility-administered  medications for this visit.    PHYSICAL EXAMINATION: ECOG PERFORMANCE STATUS: 1 - Symptomatic but completely ambulatory  Filed Vitals:   02/28/16 1440  BP: 137/57  Pulse: 68  Temp: 98 F (36.7 C)  Resp: 18   Filed Weights   02/28/16 1440  Weight: 202 lb 11.2 oz (91.944 kg)    GENERAL:alert, no distress and comfortable SKIN: skin color, texture, turgor are normal, no rashes or significant lesions EYES: normal, Conjunctiva are pink and non-injected, sclera clear OROPHARYNX:no exudate, no erythema and lips, buccal mucosa, and tongue normal  NECK: supple, thyroid normal size, non-tender, without nodularity LYMPH:  no palpable lymphadenopathy in the cervical, axillary or inguinal LUNGS: clear to auscultation and percussion with normal breathing effort HEART: regular rate & rhythm and no murmurs and no lower extremity edema ABDOMEN:abdomen soft, non-tender and normal bowel sounds MUSCULOSKELETAL:no cyanosis of digits and no clubbing  NEURO: alert & oriented x 3 with fluent speech, no focal motor/sensory deficits EXTREMITIES: No lower extremity edema  LABORATORY DATA:  I have reviewed the data as listed   Chemistry      Component Value Date/Time   NA 138 09/24/2015 1510   NA 140 08/20/2014 1340   K 4.1 09/24/2015 1510   K 3.6 08/20/2014 1340   CL 103 09/24/2015 1510   CL 108* 07/30/2012 1318   CO2 29 09/24/2015 1510   CO2 30* 08/20/2014 1340   BUN 17 09/24/2015 1510   BUN 25.8 08/20/2014 1340   CREATININE 0.93 09/24/2015 1510   CREATININE 0.82 04/27/2015 1042   CREATININE 1.1 08/20/2014 1340      Component Value Date/Time   CALCIUM 9.3 09/24/2015 1510   CALCIUM 9.4 08/20/2014 1340   ALKPHOS 50 04/27/2015 1042   ALKPHOS 55 08/20/2014 1340   AST 22 04/27/2015 1042   AST 21 08/20/2014 1340   ALT 15 04/27/2015 1042   ALT 16 08/20/2014 1340   BILITOT 0.6 04/27/2015 1042   BILITOT 0.37 08/20/2014 1340       Lab Results  Component Value Date   WBC 5.5 04/27/2015     HGB 13.6 04/27/2015   HCT 41.6 04/27/2015   MCV 87.8 04/27/2015   PLT 235 04/27/2015   NEUTROABS 2.6 04/27/2015     ASSESSMENT & PLAN:  Breast cancer, 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  Breast Cancer Surveillance: 1. Breast exam 02/28/2016: Normal 2. Mammogram 05/03/2015 No abnormalities. Postsurgical changes. Breast Density Category B I recommended that she get 3-D mammograms for surveillance. Discussed the differences between different breast density categories.  Recurrent UTIs: Patient is having symptoms of urinary discomfort: I would like to send for UA CNS.  I provided her with information regarding the weight loss program. Return to clinic in 1 year with survivorship clinic       Orders Placed This Encounter  Procedures  . UA with Microscopic    Standing Status: Future     Number of Occurrences: 1     Standing Expiration Date: 02/27/2017  . Amb Referral to Survivorship Long term    Referral Priority:  Routine    Referral Type:  Consultation    Number of Visits Requested:  1   The patient has a good understanding of the overall plan. she agrees with it. she will call with any problems that may develop before the next visit here.   Rulon Eisenmenger, MD 02/28/2016

## 2016-02-28 NOTE — Assessment & Plan Note (Addendum)
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  Breast Cancer Surveillance: 1. Breast exam 02/28/2016: Normal 2. Mammogram 05/03/2015 No abnormalities. Postsurgical changes. Breast Density Category B I recommended that she get 3-D mammograms for surveillance. Discussed the differences between different breast density categories.  Recurrent UTIs: Patient is having symptoms of urinary discomfort: I would like to send for UA CNS. I provided her with information regarding the weight loss program. Return to clinic in 1 year with survivorship clinic

## 2016-02-29 ENCOUNTER — Telehealth: Payer: Self-pay | Admitting: *Deleted

## 2016-02-29 ENCOUNTER — Other Ambulatory Visit: Payer: Self-pay | Admitting: *Deleted

## 2016-02-29 DIAGNOSIS — C50512 Malignant neoplasm of lower-outer quadrant of left female breast: Secondary | ICD-10-CM

## 2016-02-29 MED ORDER — CIPROFLOXACIN HCL 500 MG PO TABS: 500.0000 mg | ORAL_TABLET | Freq: Two times a day (BID) | ORAL | Status: DC

## 2016-02-29 NOTE — Telephone Encounter (Signed)
Called patient to let her know that urinalysis was positive for infection. Cipro called in to pharmacy.

## 2016-03-16 ENCOUNTER — Other Ambulatory Visit: Payer: Self-pay | Admitting: Family Medicine

## 2016-03-16 MED ORDER — LEVOTHYROXINE SODIUM 25 MCG PO TABS: 25.0000 ug | ORAL_TABLET | Freq: Every day | ORAL | Status: DC

## 2016-03-16 NOTE — Telephone Encounter (Signed)
Pt is requesting a 90 day refill of Levothyroxine. The pharmacy has been giving her 30 day prescriptions, so she currently only has one pill left.  [employer]

## 2016-03-16 NOTE — Telephone Encounter (Signed)
Levothyroxine sent to [facility].  Patient aware.

## 2016-04-13 ENCOUNTER — Encounter: Payer: Self-pay | Admitting: Family Medicine

## 2016-04-13 ENCOUNTER — Ambulatory Visit (INDEPENDENT_AMBULATORY_CARE_PROVIDER_SITE_OTHER): Payer: Medicare Other | Admitting: Family Medicine

## 2016-04-13 VITALS — BP 130/60 | HR 72 | Temp 98.1°F | Resp 18 | Ht 61.0 in | Wt 204.0 lb

## 2016-04-13 DIAGNOSIS — E038 Other specified hypothyroidism: Secondary | ICD-10-CM

## 2016-04-13 DIAGNOSIS — N390 Urinary tract infection, site not specified: Secondary | ICD-10-CM

## 2016-04-13 DIAGNOSIS — I10 Essential (primary) hypertension: Secondary | ICD-10-CM | POA: Diagnosis not present

## 2016-04-13 DIAGNOSIS — R0609 Other forms of dyspnea: Secondary | ICD-10-CM

## 2016-04-13 LAB — URINALYSIS, MICROSCOPIC ONLY
Crystals: NONE SEEN [HPF]
Yeast: NONE SEEN [HPF]

## 2016-04-13 LAB — URINALYSIS, ROUTINE W REFLEX MICROSCOPIC
GLUCOSE, UA: NEGATIVE
Hgb urine dipstick: NEGATIVE
Nitrite: NEGATIVE
SPECIFIC GRAVITY, URINE: 1.02 (ref 1.001–1.035)
pH: 6 (ref 5.0–8.0)

## 2016-04-14 LAB — CBC WITH DIFFERENTIAL/PLATELET
BASOS PCT: 1 %
Basophils Absolute: 59 cells/uL (ref 0–200)
EOS ABS: 118 {cells}/uL (ref 15–500)
EOS PCT: 2 %
HEMATOCRIT: 43.4 % (ref 35.0–45.0)
HEMOGLOBIN: 14.2 g/dL (ref 12.0–15.0)
Lymphocytes Relative: 34 %
Lymphs Abs: 2006 cells/uL (ref 850–3900)
MCH: 28.6 pg (ref 27.0–33.0)
MCHC: 32.7 g/dL (ref 32.0–36.0)
MCV: 87.5 fL (ref 80.0–100.0)
MONO ABS: 708 {cells}/uL (ref 200–950)
MPV: 10.2 fL (ref 7.5–12.5)
Monocytes Relative: 12 %
NEUTROS PCT: 51 %
Neutro Abs: 3009 cells/uL (ref 1500–7800)
Platelets: 238 10*3/uL (ref 140–400)
RBC: 4.96 MIL/uL (ref 3.80–5.10)
RDW: 14.6 % (ref 11.0–15.0)
WBC: 5.9 10*3/uL (ref 3.8–10.8)

## 2016-04-14 LAB — COMPLETE METABOLIC PANEL WITH GFR
ALBUMIN: 4.1 g/dL (ref 3.6–5.1)
ALT: 16 U/L (ref 6–29)
AST: 22 U/L (ref 10–35)
Alkaline Phosphatase: 78 U/L (ref 33–130)
BUN: 20 mg/dL (ref 7–25)
CALCIUM: 9.3 mg/dL (ref 8.6–10.4)
CHLORIDE: 102 mmol/L (ref 98–110)
CO2: 25 mmol/L (ref 20–31)
CREATININE: 0.96 mg/dL — AB (ref 0.60–0.93)
GFR, Est African American: 67 mL/min (ref >=60)
GFR, Est Non African American: 58 mL/min — ABNORMAL LOW (ref >=60)
GLUCOSE: 96 mg/dL (ref 70–99)
POTASSIUM: 4.2 mmol/L (ref 3.5–5.3)
SODIUM: 137 mmol/L (ref 135–146)
Total Bilirubin: 0.6 mg/dL (ref 0.2–1.2)
Total Protein: 6.7 g/dL (ref 6.1–8.1)

## 2016-04-14 LAB — TSH: TSH: 2.32 m[IU]/L

## 2016-04-14 NOTE — Progress Notes (Signed)
Subjective:    Patient ID: Joan Weaver, female    DOB: 09-30-1941, 75 y.o.   MRN: [id number]  HPI  Was recently diagnosed and treated for UTI at [facility].  Denies any symptoms of UTI however she would like to check to ensure complete resolution. Urinalysis today however shows leukocyte esterase blood and white blood cells maybe possible contaminant. She is also considering knee replacement. She denies any chest pain. However she has significant dyspnea on exertion.  She has a history of chronic respiratory problems stemming from possible pneumonitis from her previous treatment of breast cancer versus obstructive lung disease versus restrictive lung disease. She is followed by pulmonology. Her breathing is currently stable. However the dyspnea on exertion is profound and occurs with minimal activity. She denies any orthopnea or paroxysmal my internal dyspnea. She also has a history of hypertension but her blood pressures well controlled today. She is due to recheck her TSH for hypothyroidism for which she takes levothyroxine Past Medical History  Diagnosis Date  . Thyroid disease   . Osteoporosis   . Hernia   . Cataract   . Ulcer   . Urinary incontinence   . Hypertension     no meds x 3 yrs  . Heart murmur     slight murmur -never had any problems  . Hypothyroidism   . 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 H/O bladder infections   . GERD (gastroesophageal reflux disease)     minor - tx with zantac  . Headache(784.0)     otc med prn  . Breast cancer [facility]) August 2011    left - lumpectomy  . Arthritis     knees, hips   Past Surgical History  Procedure Laterality Date  . Hiatal hernia repair      x 2  . Tonsillectomy    . Appendectomy    . Left cataract surgery    . Breast lumpectomy      left  . Knee surgery      Arthroscopic  . Eye surgery      left- cataract   . Hysteroscopy w/d&c N/A 07/07/2013    Procedure: DILATATION  AND CURETTAGE /HYSTEROSCOPY;  Surgeon: Anastasio Auerbach, MD;  Location: [facility];  Service: Gynecology;  Laterality: N/A;   Current Outpatient Prescriptions on File Prior to Visit  Medication Sig Dispense Refill  . acetaminophen (TYLENOL) 500 MG tablet Take 500 mg by mouth every 6 (six) hours as needed for pain.     Marland Kitchen amLODipine (NORVASC) 10 MG tablet Take 1 tablet (10 mg total) by mouth daily. 90 tablet 3  . BIOTIN FORTE PO Take 1 tablet by mouth daily.    Marland Kitchen CALCIUM PO Take 1 tablet by mouth daily.     . Cholecalciferol (VITAMIN D) 1000 UNITS capsule Take 1,000 Units by mouth daily.     . Cranberry 500 MG CAPS Take 2 capsules by mouth daily.     Marland Kitchen etodolac (LODINE) 500 MG tablet Take 1 tablet (500 mg total) by mouth 2 (two) times daily. 30 tablet 5  . fish oil-omega-3 fatty acids 1000 MG capsule Take 2 g by mouth daily.     . fluconazole (DIFLUCAN) 150 MG tablet Take 1 tablet (150 mg total) by mouth once. 1 tablet 0  . glucosamine-chondroitin 500-400 MG tablet Take 1 tablet by mouth daily.    Marland Kitchen levothyroxine (SYNTHROID, LEVOTHROID) 25 MCG tablet  Take 1 tablet (25 mcg total) by mouth daily. 90 tablet 0  . loratadine (CLARITIN) 10 MG tablet Take 10 mg by mouth daily.      . Magnesium 100 MG CAPS Take 1 capsule by mouth 2 (two) times daily.     . meclizine (ANTIVERT) 25 MG tablet Take 1 tablet (25 mg total) by mouth 3 (three) times daily as needed for dizziness. 30 tablet 1  . metoprolol succinate (TOPROL-XL) 25 MG 24 hr tablet Take 1 tablet (25 mg total) by mouth daily. 90 tablet 3  . Multiple Vitamin (MULTIVITAMIN) capsule Take 1 capsule by mouth daily.      . Polyvinyl Alcohol-Povidone (CLEAR EYES ALL SEASONS OP) Apply 1 drop to eye daily as needed (dry eyes).    . pyridOXINE (VITAMIN B-6) 100 MG tablet Take 100 mg by mouth daily.      . ranitidine (ZANTAC) 150 MG tablet Take 150 mg by mouth daily.     . vitamin E 400 UNIT capsule Take 400 Units by mouth daily.       No current  facility-administered medications on file prior to visit.   Allergies  Allergen Reactions  . Advair Diskus [Fluticasone-Salmeterol]     Nervous and shakey  . Aspirin     If she takes too much, it burns her stomach  . Bactrim [Sulfamethoxazole-Trimethoprim] Nausea Only  . Demerol Nausea Only  . Meperidine Nausea Only    Sick on Stomach  . Qvar [Beclomethasone] Other (See Comments)    Headache   Social History   Social History  . Marital Status: Widowed    Spouse Name: N/A  . Number of Children: 0  . Years of Education: N/A   Occupational History  . Retired      Personal assistant   Social History Main Topics  . Smoking status: Never Smoker   . Smokeless tobacco: Never Used  . Alcohol Use: No  . Drug Use: No  . Sexual Activity: Not Currently    Birth Control/ Protection: Post-menopausal   Other Topics Concern  . Not on file   Social History Narrative     Review of Systems  All other systems reviewed and are negative.      Objective:   Physical Exam  Constitutional: She appears well-developed and well-nourished. No distress.  HENT:  Head: Normocephalic and atraumatic.  Nose: Nose normal.  Mouth/Throat: Oropharynx is clear and moist. No oropharyngeal exudate.  Eyes: Conjunctivae are normal. No scleral icterus.  Neck: Neck supple. No JVD present. No thyromegaly present.  Cardiovascular: Normal rate, regular rhythm and normal heart sounds.   No murmur heard. Pulmonary/Chest: Effort normal. She has decreased breath sounds. She has no wheezes. She has no rales. She exhibits no tenderness.  Abdominal: Soft. Bowel sounds are normal.  Musculoskeletal: She exhibits no edema.  Lymphadenopathy:    She has no cervical adenopathy.  Skin: She is not diaphoretic.  Vitals reviewed.         Assessment & Plan:  Urinary tract infection, site not specified - Plan: Urinalysis, Routine w reflex microscopic (not at [facility]), Urine culture  Other specified hypothyroidism - Plan:  CBC with Differential/Platelet, COMPLETE METABOLIC PANEL WITH GFR, TSH  Dyspnea on exertion - Plan: ECHOCARDIOGRAM COMPLETE  Benign essential HTN  I believe the urinalysis likely reflects vaginal contaminant. Therefore I'll check a urine culture and treat only if culture positive. Her blood pressures well controlled today. I will recheck a CBC CMP and a TSH regarding her hypothyroidism  and her dyspnea on exertion. I believe her dyspnea on exertion is related to her chronic pulmonary issues. However to complete a workup prior to any elective surgery, I would proceed with an echocardiogram of the heart to rule out cardiomyopathy. If echocardiogram is normal, I believe the patient is medically cleared to proceed with any upcoming surgery.

## 2016-04-15 LAB — URINE CULTURE

## 2016-04-19 ENCOUNTER — Ambulatory Visit (INDEPENDENT_AMBULATORY_CARE_PROVIDER_SITE_OTHER): Payer: Medicare Other | Admitting: Ophthalmology

## 2016-04-19 DIAGNOSIS — H33302 Unspecified retinal break, left eye: Secondary | ICD-10-CM

## 2016-04-19 DIAGNOSIS — I10 Essential (primary) hypertension: Secondary | ICD-10-CM | POA: Diagnosis not present

## 2016-04-19 DIAGNOSIS — H35033 Hypertensive retinopathy, bilateral: Secondary | ICD-10-CM

## 2016-04-19 DIAGNOSIS — D3132 Benign neoplasm of left choroid: Secondary | ICD-10-CM

## 2016-04-19 DIAGNOSIS — H43813 Vitreous degeneration, bilateral: Secondary | ICD-10-CM

## 2016-05-01 ENCOUNTER — Other Ambulatory Visit: Payer: Self-pay | Admitting: Family Medicine

## 2016-05-01 DIAGNOSIS — I10 Essential (primary) hypertension: Secondary | ICD-10-CM

## 2016-05-02 ENCOUNTER — Other Ambulatory Visit (HOSPITAL_COMMUNITY): Payer: Self-pay

## 2016-05-02 ENCOUNTER — Ambulatory Visit (HOSPITAL_COMMUNITY): Payer: Medicare Other | Attending: Cardiology

## 2016-05-02 DIAGNOSIS — R0609 Other forms of dyspnea: Secondary | ICD-10-CM | POA: Diagnosis not present

## 2016-05-02 DIAGNOSIS — I340 Nonrheumatic mitral (valve) insufficiency: Secondary | ICD-10-CM | POA: Insufficient documentation

## 2016-05-02 DIAGNOSIS — I119 Hypertensive heart disease without heart failure: Secondary | ICD-10-CM | POA: Diagnosis not present

## 2016-05-02 DIAGNOSIS — R0600 Dyspnea, unspecified: Secondary | ICD-10-CM | POA: Diagnosis present

## 2016-05-03 ENCOUNTER — Other Ambulatory Visit: Payer: Self-pay | Admitting: Family Medicine

## 2016-05-03 NOTE — Telephone Encounter (Signed)
Refill appropriate and filled per protocol. 

## 2016-05-04 DIAGNOSIS — N6489 Other specified disorders of breast: Secondary | ICD-10-CM | POA: Diagnosis not present

## 2016-05-04 LAB — HM MAMMOGRAPHY

## 2016-05-11 ENCOUNTER — Encounter: Payer: Self-pay | Admitting: Family Medicine

## 2016-06-07 ENCOUNTER — Other Ambulatory Visit: Payer: Self-pay | Admitting: Family Medicine

## 2016-06-23 ENCOUNTER — Other Ambulatory Visit: Payer: Self-pay | Admitting: Family Medicine

## 2016-07-06 ENCOUNTER — Encounter: Payer: Self-pay | Admitting: Family Medicine

## 2016-07-06 ENCOUNTER — Other Ambulatory Visit: Payer: Self-pay | Admitting: Family Medicine

## 2016-07-06 ENCOUNTER — Ambulatory Visit (INDEPENDENT_AMBULATORY_CARE_PROVIDER_SITE_OTHER): Payer: Medicare Other | Admitting: Family Medicine

## 2016-07-06 VITALS — BP 100/76 | HR 98 | Temp 98.1°F | Resp 22 | Ht 61.0 in | Wt 204.0 lb

## 2016-07-06 DIAGNOSIS — R350 Frequency of micturition: Secondary | ICD-10-CM

## 2016-07-06 DIAGNOSIS — K589 Irritable bowel syndrome without diarrhea: Secondary | ICD-10-CM

## 2016-07-06 DIAGNOSIS — R300 Dysuria: Secondary | ICD-10-CM | POA: Diagnosis not present

## 2016-07-06 DIAGNOSIS — Z23 Encounter for immunization: Secondary | ICD-10-CM | POA: Diagnosis not present

## 2016-07-06 LAB — URINALYSIS, ROUTINE W REFLEX MICROSCOPIC
Bilirubin Urine: NEGATIVE
Glucose, UA: NEGATIVE
HGB URINE DIPSTICK: NEGATIVE
Ketones, ur: NEGATIVE
LEUKOCYTES UA: NEGATIVE
NITRITE: NEGATIVE
PH: 5.5 (ref 5.0–8.0)
Protein, ur: NEGATIVE
SPECIFIC GRAVITY, URINE: 1.015 (ref 1.001–1.035)

## 2016-07-06 NOTE — Progress Notes (Signed)
Subjective:    Patient ID: Joan Weaver, female    DOB: May 02, 1941, 75 y.o.   MRN: [id number]  HPI Patient is here today mainly for diarrhea.  She states that for several months, she has been having loose stools and nausea every morning.  Will occasionally have fecal incontinence.  Usually runny or "mushy".  Occasionally "stringy."  Denies fevers, chills, weight loss, melena, or hematochezia.  Symptoms improve through out hte day.  On no fiber.  On magnesium.    Also continues to complain of dysuria.  Mild but frequent.  Please see my last few OV.  UA today shows no sign of UTI.  I have recommended urology consultation of urodynamics and cystoscopy.  However, the patient has not scheduled yet.   Past Medical History:  Diagnosis Date  . Arthritis    knees, hips  . Breast cancer [facility]) August 2011   left - lumpectomy  . Cataract   . GERD (gastroesophageal reflux disease)    minor - tx with zantac  . H/O bladder infections   . Headache(784.0)    otc med prn  . Heart murmur    slight murmur -never had any problems  . Hernia   . Hypertension    no meds x 3 yrs  . Hypothyroidism   . Osteoporosis   . 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 Thyroid disease   . Ulcer   . Urinary incontinence    Past Surgical History:  Procedure Laterality Date  . APPENDECTOMY    . BREAST LUMPECTOMY     left  . EYE SURGERY     left- cataract   . HIATAL HERNIA REPAIR     x 2  . HYSTEROSCOPY W/D&C N/A 07/07/2013   Procedure: DILATATION AND CURETTAGE /HYSTEROSCOPY;  Surgeon: Anastasio Auerbach, MD;  Location: [place] [facility];  Service: Gynecology;  Laterality: N/A;  . KNEE SURGERY     Arthroscopic  . left cataract surgery    . TONSILLECTOMY     Current Outpatient Prescriptions on File Prior to Visit  Medication Sig Dispense Refill  . acetaminophen (TYLENOL) 500 MG tablet Take 500 mg by mouth every 6 (six) hours as needed for pain.     Marland Kitchen amLODipine (NORVASC) 10 MG  tablet Take 1 tablet (10 mg total) by mouth daily. 90 tablet 3  . BIOTIN FORTE PO Take 1 tablet by mouth daily.    Marland Kitchen CALCIUM PO Take 1 tablet by mouth daily.     . Cholecalciferol (VITAMIN D) 1000 UNITS capsule Take 1,000 Units by mouth daily.     . Cranberry 500 MG CAPS Take 2 capsules by mouth daily.     Marland Kitchen etodolac (LODINE) 500 MG tablet TAKE ONE TABLET BY MOUTH TWICE DAILY 30 tablet 0  . fish oil-omega-3 fatty acids 1000 MG capsule Take 2 g by mouth daily.     . fluconazole (DIFLUCAN) 150 MG tablet Take 1 tablet (150 mg total) by mouth once. 1 tablet 0  . glucosamine-chondroitin 500-400 MG tablet Take 1 tablet by mouth daily.    Marland Kitchen levothyroxine (SYNTHROID, LEVOTHROID) 25 MCG tablet TAKE ONE TABLET BY MOUTH ONCE DAILY 90 tablet 3  . loratadine (CLARITIN) 10 MG tablet Take 10 mg by mouth daily.      . Magnesium 100 MG CAPS Take 1 capsule by mouth 2 (two) times daily.     . meclizine (ANTIVERT) 25 MG tablet Take 1  tablet (25 mg total) by mouth 3 (three) times daily as needed for dizziness. 30 tablet 1  . metoprolol succinate (TOPROL-XL) 25 MG 24 hr tablet TAKE ONE TABLET BY MOUTH ONCE DAILY 90 tablet 3  . Multiple Vitamin (MULTIVITAMIN) capsule Take 1 capsule by mouth daily.      . Polyvinyl Alcohol-Povidone (CLEAR EYES ALL SEASONS OP) Apply 1 drop to eye daily as needed (dry eyes).    . pyridOXINE (VITAMIN B-6) 100 MG tablet Take 100 mg by mouth daily.      . ranitidine (ZANTAC) 150 MG tablet Take 150 mg by mouth daily.     . vitamin E 400 UNIT capsule Take 400 Units by mouth daily.       No current facility-administered medications on file prior to visit.    Allergies  Allergen Reactions  . Advair Diskus [Fluticasone-Salmeterol]     Nervous and shakey  . Aspirin     If she takes too much, it burns her stomach  . Bactrim [Sulfamethoxazole-Trimethoprim] Nausea Only  . Demerol Nausea Only  . Meperidine Nausea Only    Sick on Stomach  . Qvar [Beclomethasone] Other (See Comments)     Headache   Social History   Social History  . Marital status: Widowed    Spouse name: N/A  . Number of children: 0  . Years of education: N/A   Occupational History  . Retired      Personal assistant   Social History Main Topics  . Smoking status: Never Smoker  . Smokeless tobacco: Never Used  . Alcohol use No  . Drug use: No  . Sexual activity: Not Currently    Birth control/ protection: Post-menopausal   Other Topics Concern  . Not on file   Social History Narrative  . No narrative on file     Review of Systems  All other systems reviewed and are negative.      Objective:   Physical Exam  Constitutional: She appears well-developed and well-nourished. No distress.  HENT:  Head: Normocephalic and atraumatic.  Nose: Nose normal.  Mouth/Throat: Oropharynx is clear and moist. No oropharyngeal exudate.  Eyes: Conjunctivae are normal. No scleral icterus.  Neck: Neck supple. No JVD present. No thyromegaly present.  Cardiovascular: Normal rate, regular rhythm and normal heart sounds.   No murmur heard. Pulmonary/Chest: Effort normal. She has decreased breath sounds. She has no wheezes. She has no rales. She exhibits no tenderness.  Abdominal: Soft. Bowel sounds are normal.  Musculoskeletal: She exhibits no edema.  Lymphadenopathy:    She has no cervical adenopathy.  Skin: She is not diaphoretic.  Vitals reviewed.         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  Last colonoscopy is 7 years ago.  Malignancy and IBD is also on the differential.  We will try welchol 3 tabs pobid, a daily fiber supplement, and d/c magnesium for 2 weeks and then recheck.  If persistent, consult GI for colonoscopy.  Consider viberzi if colonoscopy is nml.   I suspect IC.  Recommend urology  consult again vs a trial of estrace vaginal cream. Patient defers for now.

## 2016-07-06 NOTE — Addendum Note (Signed)
Addended by: Shary Decamp B on: 07/06/2016 11:34 AM   Modules accepted: Orders

## 2016-07-09 LAB — URINE CULTURE

## 2016-07-10 ENCOUNTER — Telehealth: Payer: Self-pay | Admitting: Family Medicine

## 2016-07-10 DIAGNOSIS — N390 Urinary tract infection, site not specified: Secondary | ICD-10-CM

## 2016-07-10 MED ORDER — SULFAMETHOXAZOLE-TRIMETHOPRIM 800-160 MG PO TABS: 1.0000 | ORAL_TABLET | Freq: Two times a day (BID) | ORAL | 0 refills | Status: DC

## 2016-07-10 NOTE — Telephone Encounter (Signed)
Medication called/sent to requested pharmacy - f/u UC order placed and pt aware.

## 2016-07-10 NOTE — Telephone Encounter (Signed)
-----   Message from [name], MD sent at 07/10/2016  6:43 AM EDT ----- Urine cx shows klebsiellea UTI.  UA was wrong initially.  Begin bactrim ds 1 pobid for 1 week.  Repeat urine cx in 2 weeks.

## 2016-07-12 ENCOUNTER — Telehealth: Payer: Self-pay | Admitting: Family Medicine

## 2016-07-12 DIAGNOSIS — R300 Dysuria: Secondary | ICD-10-CM

## 2016-07-12 NOTE — Telephone Encounter (Signed)
-----   Message from [name], MD sent at 07/07/2016  7:14 AM EDT ----- The more I consider it, I would like her to see urologist given her persistent dysura even though repeated UA's have been normal.  Can we schedule for her

## 2016-07-31 ENCOUNTER — Telehealth: Payer: Self-pay | Admitting: Family Medicine

## 2016-07-31 MED ORDER — COLESEVELAM HCL 625 MG PO TABS: 1875.0000 mg | ORAL_TABLET | Freq: Two times a day (BID) | ORAL | 5 refills | Status: DC

## 2016-07-31 NOTE — Telephone Encounter (Signed)
Pt called and states that the samples [facility] gave at [facility] has helped and wanted a prescription for it sent to pharmacy.   Per WTP ok to send in RX  Medication called/sent to requested pharmacy and pt aware

## 2016-08-04 ENCOUNTER — Other Ambulatory Visit: Payer: Medicare Other

## 2016-08-04 DIAGNOSIS — N390 Urinary tract infection, site not specified: Secondary | ICD-10-CM | POA: Diagnosis not present

## 2016-08-06 LAB — URINE CULTURE

## 2016-08-10 ENCOUNTER — Telehealth: Payer: Self-pay | Admitting: Family Medicine

## 2016-08-10 NOTE — Telephone Encounter (Signed)
LMTRC

## 2016-08-10 NOTE — Telephone Encounter (Signed)
Patient calling about urine culture results, says she is starting to hurt more [phone]

## 2016-08-16 ENCOUNTER — Other Ambulatory Visit: Payer: Self-pay | Admitting: Family Medicine

## 2016-08-16 DIAGNOSIS — R300 Dysuria: Secondary | ICD-10-CM

## 2016-08-21 NOTE — Telephone Encounter (Signed)
Pt spoke to [facility] see lab note

## 2016-09-07 DIAGNOSIS — N393 Stress incontinence (female) (male): Secondary | ICD-10-CM | POA: Diagnosis not present

## 2016-09-07 DIAGNOSIS — R350 Frequency of micturition: Secondary | ICD-10-CM | POA: Diagnosis not present

## 2016-09-07 DIAGNOSIS — R351 Nocturia: Secondary | ICD-10-CM | POA: Diagnosis not present

## 2016-09-20 ENCOUNTER — Encounter (HOSPITAL_COMMUNITY): Payer: Self-pay | Admitting: *Deleted

## 2016-09-20 ENCOUNTER — Emergency Department (HOSPITAL_COMMUNITY): Payer: Medicare Other

## 2016-09-20 ENCOUNTER — Emergency Department (HOSPITAL_COMMUNITY)
Admission: EM | Admit: 2016-09-20 | Discharge: 2016-09-20 | Disposition: A | Discharge status: Home / Self Care | Payer: Medicare Other | Attending: Emergency Medicine | Admitting: Emergency Medicine

## 2016-09-20 DIAGNOSIS — Y999 Unspecified external cause status: Secondary | ICD-10-CM | POA: Insufficient documentation

## 2016-09-20 DIAGNOSIS — W19XXXA Unspecified fall, initial encounter: Secondary | ICD-10-CM

## 2016-09-20 DIAGNOSIS — E039 Hypothyroidism, unspecified: Secondary | ICD-10-CM | POA: Insufficient documentation

## 2016-09-20 DIAGNOSIS — Z79899 Other long term (current) drug therapy: Secondary | ICD-10-CM | POA: Diagnosis not present

## 2016-09-20 DIAGNOSIS — I10 Essential (primary) hypertension: Secondary | ICD-10-CM | POA: Diagnosis not present

## 2016-09-20 DIAGNOSIS — Y929 Unspecified place or not applicable: Secondary | ICD-10-CM | POA: Diagnosis not present

## 2016-09-20 DIAGNOSIS — R0781 Pleurodynia: Secondary | ICD-10-CM | POA: Insufficient documentation

## 2016-09-20 DIAGNOSIS — S8012XA Contusion of left lower leg, initial encounter: Secondary | ICD-10-CM | POA: Diagnosis not present

## 2016-09-20 DIAGNOSIS — S299XXA Unspecified injury of thorax, initial encounter: Secondary | ICD-10-CM | POA: Diagnosis not present

## 2016-09-20 DIAGNOSIS — T148XXA Other injury of unspecified body region, initial encounter: Secondary | ICD-10-CM

## 2016-09-20 DIAGNOSIS — W108XXA Fall (on) (from) other stairs and steps, initial encounter: Secondary | ICD-10-CM | POA: Diagnosis not present

## 2016-09-20 DIAGNOSIS — M7989 Other specified soft tissue disorders: Secondary | ICD-10-CM | POA: Diagnosis not present

## 2016-09-20 DIAGNOSIS — Y939 Activity, unspecified: Secondary | ICD-10-CM | POA: Insufficient documentation

## 2016-09-20 DIAGNOSIS — S8992XA Unspecified injury of left lower leg, initial encounter: Secondary | ICD-10-CM | POA: Diagnosis not present

## 2016-09-20 DIAGNOSIS — S8002XA Contusion of left knee, initial encounter: Secondary | ICD-10-CM | POA: Diagnosis not present

## 2016-09-20 DIAGNOSIS — Z853 Personal history of malignant neoplasm of breast: Secondary | ICD-10-CM | POA: Insufficient documentation

## 2016-09-20 MED ORDER — ACETAMINOPHEN 500 MG PO TABS
1000.0000 mg | ORAL_TABLET | Freq: Once | ORAL | Status: AC
  Administered 2016-09-20: 1000 mg via ORAL
  Filled 2016-09-20: qty 2

## 2016-09-20 NOTE — ED Provider Notes (Signed)
[facility] [facility] DEPT Provider Note   CSN: [id number] Arrival date & time: 09/20/16  1709  By signing my name below, I, Arianna Nassar, attest that this documentation has been prepared under the direction and in the presence of Fatima Blank, MD.  Electronically Signed: Julien Nordmann, ED Scribe. 09/20/16. 6:54 PM.   History   Chief Complaint Chief Complaint  Patient presents with  . Fall   The history is provided by the patient. No language interpreter was used.   HPI Comments: Joan Weaver is a 75 y.o. female who has a PMHx of HTN, osteoporosis, and thyroid disease presents to the Emergency Department complaining of gradual worsening, moderate, left leg pain and swelling s/p a fall that occurred last night. Pt has associated ecchymosis to the area. She also reports right rib pain secondary to hitting the steps when she fell. Pt reports she was going up the steps last night when she tried to open the door, lost her balance, and fell down 3 cement steps. She did not hit her head or lose consciousness. Pt has been applying ice to the area and notes taking tylenol to relieve the swelling and pain with relief. Pt has been ambulatory when needed. She is not on any anticoagulants. She denies headache.  Past Medical History:  Diagnosis Date  . Arthritis    knees, hips  . Breast cancer [facility]) August 2011   left - lumpectomy  . Cataract   . GERD (gastroesophageal reflux disease)    minor - tx with zantac  . H/O bladder infections   . Headache(784.0)    otc med prn  . Heart murmur    slight murmur -never had any problems  . Hernia   . Hypertension    no meds x 3 yrs  . Hypothyroidism   . Osteoporosis   . 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 Thyroid disease   . Ulcer ([place])   . Urinary incontinence     Patient Active Problem List   Diagnosis Date Noted  . UTI (lower urinary tract infection) 02/28/2016  . Restrictive lung disease  11/01/2015  . Osteoporosis 06/22/2015  . Breast cancer, left ([place]) 02/19/2014  . Acute bronchitis 09/09/2012  . Esophageal dysmotility 04/19/2012  . Benign esophageal stricture 04/06/2012  . History of hiatal hernia 03/06/2012  . Dysphagia 03/06/2012  . GERD (gastroesophageal reflux disease) 03/06/2012  . Pneumonia 12/29/2010    Past Surgical History:  Procedure Laterality Date  . APPENDECTOMY    . BREAST LUMPECTOMY     left  . EYE SURGERY     left- cataract   . HIATAL HERNIA REPAIR     x 2  . HYSTEROSCOPY W/D&C N/A 07/07/2013   Procedure: DILATATION AND CURETTAGE /HYSTEROSCOPY;  Surgeon: Anastasio Auerbach, MD;  Location: [facility];  Service: Gynecology;  Laterality: N/A;  . KNEE SURGERY     Arthroscopic  . left cataract surgery    . TONSILLECTOMY      OB History    Gravida Para Term Preterm AB Living   0             SAB TAB Ectopic Multiple Live Births                   Home Medications    Prior to Admission medications   Medication Sig Start Date End Date Taking? Authorizing Provider  acetaminophen (TYLENOL) 500 MG tablet Take 500 mg  by mouth every 6 (six) hours as needed for pain.     Historical Provider, MD  amLODipine (NORVASC) 10 MG tablet Take 1 tablet (10 mg total) by mouth daily. 12/20/15   [name], MD  BIOTIN FORTE PO Take 1 tablet by mouth daily.    Historical Provider, MD  CALCIUM PO Take 1 tablet by mouth daily.     Historical Provider, MD  Cholecalciferol (VITAMIN D) 1000 UNITS capsule Take 1,000 Units by mouth daily.     Historical Provider, MD  colesevelam [facility]) 625 MG tablet Take 3 tablets (1,875 mg total) by mouth 2 (two) times daily with a meal. 07/31/16   [name], MD  Cranberry 500 MG CAPS Take 2 capsules by mouth daily.     Historical Provider, MD  etodolac (LODINE) 500 MG tablet TAKE ONE TABLET BY MOUTH TWICE DAILY 07/06/16   [name], MD  fish oil-omega-3 fatty acids 1000 MG capsule Take 2 g by mouth daily.      Historical Provider, MD  fluconazole (DIFLUCAN) 150 MG tablet Take 1 tablet (150 mg total) by mouth once. 01/06/16   [name], MD  glucosamine-chondroitin 500-400 MG tablet Take 1 tablet by mouth daily.    Historical Provider, MD  levothyroxine (SYNTHROID, LEVOTHROID) 25 MCG tablet TAKE ONE TABLET BY MOUTH ONCE DAILY 06/26/16   [name], MD  loratadine (CLARITIN) 10 MG tablet Take 10 mg by mouth daily.      Historical Provider, MD  Magnesium 100 MG CAPS Take 1 capsule by mouth 2 (two) times daily.     Historical Provider, MD  meclizine (ANTIVERT) 25 MG tablet Take 1 tablet (25 mg total) by mouth 3 (three) times daily as needed for dizziness. 01/19/16   [name], MD  metoprolol succinate (TOPROL-XL) 25 MG 24 hr tablet TAKE ONE TABLET BY MOUTH ONCE DAILY 05/01/16   [name], MD  Multiple Vitamin (MULTIVITAMIN) capsule Take 1 capsule by mouth daily.      Historical Provider, MD  Polyvinyl Alcohol-Povidone (CLEAR EYES ALL SEASONS OP) Apply 1 drop to eye daily as needed (dry eyes).    Historical Provider, MD  pyridOXINE (VITAMIN B-6) 100 MG tablet Take 100 mg by mouth daily.      Historical Provider, MD  ranitidine (ZANTAC) 150 MG tablet Take 150 mg by mouth daily.     Historical Provider, MD  sulfamethoxazole-trimethoprim (BACTRIM DS,SEPTRA DS) 800-160 MG tablet Take 1 tablet by mouth 2 (two) times daily. 07/10/16   [name], MD  vitamin E 400 UNIT capsule Take 400 Units by mouth daily.      Historical Provider, MD    Family History Family History  Problem Relation Age of Onset  . Prostate cancer Maternal Grandfather   . Cancer Paternal Grandfather     Stomach cancer  . Rheum arthritis Mother   . Heart disease Maternal Grandmother   . Breast cancer Maternal Aunt 70    Social History Social History  Substance Use Topics  . Smoking status: Never Smoker  . Smokeless tobacco: Never Used  . Alcohol use No     Allergies   Advair diskus  [fluticasone-salmeterol]; Aspirin; Bactrim [sulfamethoxazole-trimethoprim]; Demerol; Meperidine; and Qvar [beclomethasone]   Review of Systems Review of Systems  A complete 10 system review of systems was obtained and all systems are negative except as noted in the HPI and PMH.    Physical Exam Updated Vital Signs BP 153/73 (BP Location:  Right Arm)   Pulse 83   Temp 98.5 F (36.9 C) (Oral)   Resp 17   SpO2 94%   Physical Exam  Constitutional: She is oriented to person, place, and time. She appears well-developed and well-nourished. No distress.  HENT:  Head: Normocephalic and atraumatic.  Nose: Nose normal.  Eyes: Conjunctivae and EOM are normal. Pupils are equal, round, and reactive to light. Right eye exhibits no discharge. Left eye exhibits no discharge. No scleral icterus.  Neck: Normal range of motion. Neck supple.  Cardiovascular: Normal rate and regular rhythm.  Exam reveals no gallop and no friction rub.   No murmur heard. Pulmonary/Chest: Effort normal and breath sounds normal. No stridor. No respiratory distress. She has no rales. She exhibits tenderness (mild right rib pain).    Abdominal: Soft. She exhibits no distension. There is no tenderness.  Musculoskeletal: She exhibits edema and tenderness.  Large hematoma on anterior proximal aspect to left lower leg. Neurovascularly intact distally.  Neurological: She is alert and oriented to person, place, and time.  Skin: Skin is warm and dry. No rash noted. She is not diaphoretic. No erythema.  Psychiatric: She has a normal mood and affect.  Vitals reviewed.    ED Treatments / Results  DIAGNOSTIC STUDIES: Oxygen Saturation is 94% on RA, low by my interpretation.  COORDINATION OF CARE:  6:40 PM Discussed treatment plan with pt at bedside and pt agreed to plan.  Labs (all labs ordered are listed, but only abnormal results are displayed) Labs Reviewed - No data to display  EKG  EKG Interpretation None        Radiology Dg Ribs Unilateral W/chest Right  Result Date: 09/20/2016 CLINICAL DATA:  75 year old female with a history of fall. Right-sided pain EXAM: RIGHT RIBS AND CHEST - 3+ VIEW COMPARISON:  10/16/2013, 09/24/2015 FINDINGS: Cardiomediastinal silhouette unchanged. Calcifications of the aortic arch. Unchanged nodular density of the left mid lung. No confluent airspace disease. No pleural effusion or pneumothorax. No displaced fracture. Fiducial marker of the lower right anterior chest. IMPRESSION: No radiographic evidence of acute cardiopulmonary disease. No displaced fracture, with attention to the fiducial marker of the right lower chest. Aortic atherosclerosis. Signed, Dulcy Fanny. Earleen Newport, DO [facility] Electronically Signed   By: Corrie Mckusick D.O.   On: 09/20/2016 18:03   Dg Tibia/fibula Left  Result Date: 09/20/2016 CLINICAL DATA:  Fall on concrete steps EXAM: LEFT KNEE - COMPLETE 4+ VIEW; LEFT TIBIA AND FIBULA - 2 VIEW COMPARISON:  None. FINDINGS: There is marked soft tissue swelling along the lateral aspect of the left leg. There is no underlying osseous injury. The ankle mortise is approximated. There is mild osteoarthrosis of the left knee. IMPRESSION: Lateral left leg swelling and hematoma without acute osseous injury. Electronically Signed   By: Ulyses Jarred M.D.   On: 09/20/2016 19:20   Dg Knee Complete 4 Views Left  Result Date: 09/20/2016 CLINICAL DATA:  Fall on concrete steps EXAM: LEFT KNEE - COMPLETE 4+ VIEW; LEFT TIBIA AND FIBULA - 2 VIEW COMPARISON:  None. FINDINGS: There is marked soft tissue swelling along the lateral aspect of the left leg. There is no underlying osseous injury. The ankle mortise is approximated. There is mild osteoarthrosis of the left knee. IMPRESSION: Lateral left leg swelling and hematoma without acute osseous injury. Electronically Signed   By: Ulyses Jarred M.D.   On: 09/20/2016 19:20     Procedures Procedures (including critical care time)  Medications  Ordered in ED Medications  acetaminophen (TYLENOL) tablet 1,000 mg (1,000 mg Oral Given 09/20/16 1844)     Initial Impression / Assessment and Plan / ED Course  I have reviewed the triage vital signs and the nursing notes.  Pertinent labs & imaging results that were available during my care of the patient were reviewed by me and considered in my medical decision making (see chart for details).  Clinical Course     Mechanical fall from standing down a few steps. Complaining of right rib and left lower leg pain. Denied any head trauma, LOC, amnesia. Denies any current headache, blurry vision, hearing loss, dizziness. Denies any neck or back pain. Exam with right rib tenderness and left lower extremity hematoma. Plain films without evidence of acute fractures of the right ribs sore left lower extremity. Since this occurred yesterday and patient is not complaining of any headache, feel that CT head at this time not indicated.  The patient is safe for discharge with strict return precautions.   Final Clinical Impressions(s) / ED Diagnoses   Final diagnoses:  Fall, initial encounter  Hematoma  Rib pain on right side   Disposition: Discharge  Condition: Good  I have discussed the results, Dx and Tx plan with the patient who expressed understanding and agree(s) with the plan. Discharge instructions discussed at great length. The patient was given strict return precautions who verbalized understanding of the instructions. No further questions at time of discharge.    Current Discharge Medication List      Follow Up: [name], MD [address] [facility] [place] [id number] [phone]  Schedule an appointment as soon as possible for a visit  in 5-7 days, If symptoms do not improve or  worsen   I personally performed the services described in this documentation, which was scribed in my presence.  The recorded information has been reviewed and is accurate.        Fatima Blank, MD 09/20/16 2005

## 2016-09-20 NOTE — ED Notes (Signed)
Patient transported to X-ray 

## 2016-09-20 NOTE — ED Triage Notes (Signed)
Pt reports falling last night, fell down approx 3 cement steps. Did not hit her head and denies loc. Pt is not on blood thinners. Has left lower leg bruising and swelling, also has right rib pain.

## 2016-09-26 ENCOUNTER — Encounter: Payer: Self-pay | Admitting: Family Medicine

## 2016-09-26 ENCOUNTER — Ambulatory Visit (INDEPENDENT_AMBULATORY_CARE_PROVIDER_SITE_OTHER): Payer: Medicare Other | Admitting: Family Medicine

## 2016-09-26 VITALS — BP 144/80 | HR 68 | Temp 99.3°F | Resp 18 | Ht 61.0 in | Wt 202.0 lb

## 2016-09-26 DIAGNOSIS — L03116 Cellulitis of left lower limb: Secondary | ICD-10-CM

## 2016-09-26 DIAGNOSIS — S8012XA Contusion of left lower leg, initial encounter: Secondary | ICD-10-CM | POA: Diagnosis not present

## 2016-09-26 MED ORDER — CEPHALEXIN 500 MG PO CAPS: 500.0000 mg | ORAL_CAPSULE | Freq: Three times a day (TID) | ORAL | 0 refills | Status: DC

## 2016-09-26 NOTE — Progress Notes (Signed)
Subjective:    Patient ID: Joan Weaver, female    DOB: 1940-11-08, 75 y.o.   MRN: [id number]  HPI Patient suffered a fall on December the her to go to the emergency room. She suffered a contusion inferior and lateral to the left knee. There is a large hematoma in this area larger than the size of my hand. This is slowly turning a light yellowish brown indicating old clotted blood. There is also significant ecchymosis on the anterior surface of the shin as well as the dorsum of the left foot. The patient has full range of motion in the ankle and the knee without significant pain. She is exquisitely tender to touch over the hematoma. Furthermore there is an area approximately 6 cm x 4 cm in the center of the hematoma has become erythematous over the last 2 days, hot to the touch, and extremely tender Past Medical History:  Diagnosis Date  . Arthritis    knees, hips  . Breast cancer [facility]) August 2011   left - lumpectomy  . Cataract   . GERD (gastroesophageal reflux disease)    minor - tx with zantac  . H/O bladder infections   . Headache(784.0)    otc med prn  . Heart murmur    slight murmur -never had any problems  . Hernia   . Hypertension    no meds x 3 yrs  . Hypothyroidism   . Osteoporosis   . 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 Thyroid disease   . Ulcer ([place])   . Urinary incontinence    Past Surgical History:  Procedure Laterality Date  . APPENDECTOMY    . BREAST LUMPECTOMY     left  . EYE SURGERY     left- cataract   . HIATAL HERNIA REPAIR     x 2  . HYSTEROSCOPY W/D&C N/A 07/07/2013   Procedure: DILATATION AND CURETTAGE /HYSTEROSCOPY;  Surgeon: Anastasio Auerbach, MD;  Location: [place] [facility];  Service: Gynecology;  Laterality: N/A;  . KNEE SURGERY     Arthroscopic  . left cataract surgery    . TONSILLECTOMY     Current Outpatient Prescriptions on File Prior to Visit  Medication Sig Dispense Refill  . acetaminophen  (TYLENOL) 500 MG tablet Take 500 mg by mouth every 6 (six) hours as needed for pain.     Marland Kitchen amLODipine (NORVASC) 10 MG tablet Take 1 tablet (10 mg total) by mouth daily. 90 tablet 3  . BIOTIN FORTE PO Take 1 tablet by mouth daily.    Marland Kitchen CALCIUM PO Take 1 tablet by mouth daily.     . Cholecalciferol (VITAMIN D) 1000 UNITS capsule Take 1,000 Units by mouth daily.     . colesevelam (WELCHOL) 625 MG tablet Take 3 tablets (1,875 mg total) by mouth 2 (two) times daily with a meal. 180 tablet 5  . Cranberry 500 MG CAPS Take 2 capsules by mouth daily.     Marland Kitchen etodolac (LODINE) 500 MG tablet TAKE ONE TABLET BY MOUTH TWICE DAILY 90 tablet 3  . fish oil-omega-3 fatty acids 1000 MG capsule Take 2 g by mouth daily.     Marland Kitchen glucosamine-chondroitin 500-400 MG tablet Take 1 tablet by mouth daily.    Marland Kitchen levothyroxine (SYNTHROID, LEVOTHROID) 25 MCG tablet TAKE ONE TABLET BY MOUTH ONCE DAILY 90 tablet 3  . loratadine (CLARITIN) 10 MG tablet Take 10 mg by mouth daily.      Marland Kitchen  Magnesium 100 MG CAPS Take 1 capsule by mouth 2 (two) times daily.     . meclizine (ANTIVERT) 25 MG tablet Take 1 tablet (25 mg total) by mouth 3 (three) times daily as needed for dizziness. 30 tablet 1  . metoprolol succinate (TOPROL-XL) 25 MG 24 hr tablet TAKE ONE TABLET BY MOUTH ONCE DAILY 90 tablet 3  . Multiple Vitamin (MULTIVITAMIN) capsule Take 1 capsule by mouth daily.      . Polyvinyl Alcohol-Povidone (CLEAR EYES ALL SEASONS OP) Apply 1 drop to eye daily as needed (dry eyes).    . pyridOXINE (VITAMIN B-6) 100 MG tablet Take 100 mg by mouth daily.      . ranitidine (ZANTAC) 150 MG tablet Take 150 mg by mouth daily.     . vitamin E 400 UNIT capsule Take 400 Units by mouth daily.       No current facility-administered medications on file prior to visit.    Allergies  Allergen Reactions  . Advair Diskus [Fluticasone-Salmeterol]     Nervous and shakey  . Aspirin     If she takes too much, it burns her stomach  . Bactrim  [Sulfamethoxazole-Trimethoprim] Nausea Only  . Demerol Nausea Only  . Meperidine Nausea Only    Sick on Stomach  . Qvar [Beclomethasone] Other (See Comments)    Headache   Social History   Social History  . Marital status: Widowed    Spouse name: N/A  . Number of children: 0  . Years of education: N/A   Occupational History  . Retired      Personal assistant   Social History Main Topics  . Smoking status: Never Smoker  . Smokeless tobacco: Never Used  . Alcohol use No  . Drug use: No  . Sexual activity: Not Currently    Birth control/ protection: Post-menopausal   Other Topics Concern  . Not on file   Social History Narrative  . No narrative on file      Review of Systems  All other systems reviewed and are negative.      Objective:   Physical Exam  Cardiovascular: Normal rate, regular rhythm and normal heart sounds.   Pulmonary/Chest: Effort normal and breath sounds normal. No respiratory distress. She has no wheezes. She has no rales.  Musculoskeletal: She exhibits edema.       Left lower leg: She exhibits tenderness, swelling and edema.  Skin: Skin is warm. There is erythema.  Vitals reviewed.         Assessment & Plan:  Hematoma of leg, left, initial encounter  Cellulitis of leg, left  Hematoma is becoming secondarily infected and the patient is developing cellulitis. Begin Keflex 500 mg by mouth 3 times a day for 10 days. Recheck in one week or sooner if worse

## 2016-10-04 ENCOUNTER — Encounter: Payer: Self-pay | Admitting: Family Medicine

## 2016-10-04 ENCOUNTER — Ambulatory Visit (INDEPENDENT_AMBULATORY_CARE_PROVIDER_SITE_OTHER): Payer: Medicare Other | Admitting: Family Medicine

## 2016-10-04 VITALS — BP 100/64 | HR 72 | Temp 98.6°F | Resp 16 | Ht 61.0 in | Wt 202.0 lb

## 2016-10-04 DIAGNOSIS — L03116 Cellulitis of left lower limb: Secondary | ICD-10-CM

## 2016-10-04 DIAGNOSIS — S8012XA Contusion of left lower leg, initial encounter: Secondary | ICD-10-CM | POA: Diagnosis not present

## 2016-10-04 NOTE — Progress Notes (Signed)
Subjective:    Patient ID: Joan Weaver, female    DOB: 20-Mar-1941, 75 y.o.   MRN: [id number]  HPI   09/26/16 Patient suffered a fall on December the her to go to the emergency room. She suffered a contusion inferior and lateral to the left knee. There is a large hematoma in this area larger than the size of my hand. This is slowly turning a light yellowish brown indicating old clotted blood. There is also significant ecchymosis on the anterior surface of the shin as well as the dorsum of the left foot. The patient has full range of motion in the ankle and the knee without significant pain. She is exquisitely tender to touch over the hematoma. Furthermore there is an area approximately 6 cm x 4 cm in the center of the hematoma has become erythematous over the last 2 days, hot to the touch, and extremely tender.  At that time, my plan was: Hematoma is becoming secondarily infected and the patient is developing cellulitis. Begin Keflex 500 mg by mouth 3 times a day for 10 days. Recheck in one week or sooner if worse  10/04/16 Erythema looks much better. Once this subsided. Pain has subsided. Hematoma has started to resorb. There is still swelling. However the bright purple brown hematoma is no longer visible. The skin is again skin colored with slight yellowish green tinge. Past Medical History:  Diagnosis Date  . Arthritis    knees, hips  . Breast cancer [facility]) August 2011   left - lumpectomy  . Cataract   . GERD (gastroesophageal reflux disease)    minor - tx with zantac  . H/O bladder infections   . Headache(784.0)    otc med prn  . Heart murmur    slight murmur -never had any problems  . Hernia   . Hypertension    no meds x 3 yrs  . Hypothyroidism   . Osteoporosis   . 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 Thyroid disease   . Ulcer ([place])   . Urinary incontinence    Past Surgical History:  Procedure Laterality Date  . APPENDECTOMY      . BREAST LUMPECTOMY     left  . EYE SURGERY     left- cataract   . HIATAL HERNIA REPAIR     x 2  . HYSTEROSCOPY W/D&C N/A 07/07/2013   Procedure: DILATATION AND CURETTAGE /HYSTEROSCOPY;  Surgeon: Anastasio Auerbach, MD;  Location: [place] [facility];  Service: Gynecology;  Laterality: N/A;  . KNEE SURGERY     Arthroscopic  . left cataract surgery    . TONSILLECTOMY     Current Outpatient Prescriptions on File Prior to Visit  Medication Sig Dispense Refill  . acetaminophen (TYLENOL) 500 MG tablet Take 500 mg by mouth every 6 (six) hours as needed for pain.     Marland Kitchen amLODipine (NORVASC) 10 MG tablet Take 1 tablet (10 mg total) by mouth daily. 90 tablet 3  . BIOTIN FORTE PO Take 1 tablet by mouth daily.    Marland Kitchen CALCIUM PO Take 1 tablet by mouth daily.     . cephALEXin (KEFLEX) 500 MG capsule Take 1 capsule (500 mg total) by mouth 3 (three) times daily. 30 capsule 0  . Cholecalciferol (VITAMIN D) 1000 UNITS capsule Take 1,000 Units by mouth daily.     . colesevelam (WELCHOL) 625 MG tablet Take 3 tablets (1,875 mg total) by mouth 2 (  two) times daily with a meal. 180 tablet 5  . Cranberry 500 MG CAPS Take 2 capsules by mouth daily.     Marland Kitchen etodolac (LODINE) 500 MG tablet TAKE ONE TABLET BY MOUTH TWICE DAILY 90 tablet 3  . fish oil-omega-3 fatty acids 1000 MG capsule Take 2 g by mouth daily.     Marland Kitchen glucosamine-chondroitin 500-400 MG tablet Take 1 tablet by mouth daily.    Marland Kitchen levothyroxine (SYNTHROID, LEVOTHROID) 25 MCG tablet TAKE ONE TABLET BY MOUTH ONCE DAILY 90 tablet 3  . loratadine (CLARITIN) 10 MG tablet Take 10 mg by mouth daily.      . Magnesium 100 MG CAPS Take 1 capsule by mouth 2 (two) times daily.     . meclizine (ANTIVERT) 25 MG tablet Take 1 tablet (25 mg total) by mouth 3 (three) times daily as needed for dizziness. 30 tablet 1  . metoprolol succinate (TOPROL-XL) 25 MG 24 hr tablet TAKE ONE TABLET BY MOUTH ONCE DAILY 90 tablet 3  . Multiple Vitamin (MULTIVITAMIN) capsule Take 1 capsule by mouth  daily.      . Polyvinyl Alcohol-Povidone (CLEAR EYES ALL SEASONS OP) Apply 1 drop to eye daily as needed (dry eyes).    . pyridOXINE (VITAMIN B-6) 100 MG tablet Take 100 mg by mouth daily.      . ranitidine (ZANTAC) 150 MG tablet Take 150 mg by mouth daily.     . vitamin E 400 UNIT capsule Take 400 Units by mouth daily.       No current facility-administered medications on file prior to visit.    Allergies  Allergen Reactions  . Advair Diskus [Fluticasone-Salmeterol]     Nervous and shakey  . Aspirin     If she takes too much, it burns her stomach  . Bactrim [Sulfamethoxazole-Trimethoprim] Nausea Only  . Demerol Nausea Only  . Meperidine Nausea Only    Sick on Stomach  . Qvar [Beclomethasone] Other (See Comments)    Headache   Social History   Social History  . Marital status: Widowed    Spouse name: N/A  . Number of children: 0  . Years of education: N/A   Occupational History  . Retired      Personal assistant   Social History Main Topics  . Smoking status: Never Smoker  . Smokeless tobacco: Never Used  . Alcohol use No  . Drug use: No  . Sexual activity: Not Currently    Birth control/ protection: Post-menopausal   Other Topics Concern  . Not on file   Social History Narrative  . No narrative on file      Review of Systems  All other systems reviewed and are negative.      Objective:   Physical Exam  Cardiovascular: Normal rate, regular rhythm and normal heart sounds.   Pulmonary/Chest: Effort normal and breath sounds normal. No respiratory distress. She has no wheezes. She has no rales.  Musculoskeletal: She exhibits edema.       Left lower leg: She exhibits tenderness, swelling and edema.  Skin: Skin is warm. No erythema.  Vitals reviewed.         Assessment & Plan:  Cellulitis of leg, left  Hematoma of leg, left, initial encounter  Cellulitis is resolving. Complete antibiotics. Hematoma should gradually reabsorb and resolve over the next 2-3  weeks.

## 2016-11-02 DIAGNOSIS — N281 Cyst of kidney, acquired: Secondary | ICD-10-CM | POA: Diagnosis not present

## 2016-11-27 ENCOUNTER — Encounter: Payer: Self-pay | Admitting: Physician Assistant

## 2016-11-27 ENCOUNTER — Ambulatory Visit (INDEPENDENT_AMBULATORY_CARE_PROVIDER_SITE_OTHER): Payer: Medicare Other | Admitting: Physician Assistant

## 2016-11-27 VITALS — BP 118/82 | HR 67 | Temp 98.2°F | Resp 18 | Wt 200.2 lb

## 2016-11-27 DIAGNOSIS — B029 Zoster without complications: Secondary | ICD-10-CM | POA: Diagnosis not present

## 2016-11-27 MED ORDER — HYDROCODONE-ACETAMINOPHEN 5-325 MG PO TABS: 1.0000 | ORAL_TABLET | Freq: Four times a day (QID) | ORAL | 0 refills | Status: DC | PRN

## 2016-11-27 MED ORDER — GABAPENTIN 300 MG PO CAPS: ORAL_CAPSULE | ORAL | 3 refills | Status: DC

## 2016-11-27 MED ORDER — VALACYCLOVIR HCL 1 G PO TABS: 1000.0000 mg | ORAL_TABLET | Freq: Three times a day (TID) | ORAL | 0 refills | Status: DC

## 2016-11-28 NOTE — Progress Notes (Signed)
Patient ID: MARRANDA EMMERLING MRN: [id number], DOB: 03/23/1941, 76 y.o. Date of Encounter: 11/28/2016, 3:56 PM    Chief Complaint:  Chief Complaint  Patient presents with  . rash on side of face and neck     HPI: 76 y.o. year old female presents with above.   Says that Tuesday, Wednesday she noticed "sting"ing sensation on right earlobe.  The next couple days, she noticed "knots" on right posterior head.  Saturday night she felt "a cluster" on right side of neck.  The above areas are very painful.  Only got 2 hours of sleep last night secondary to pain.     Home Meds:   Outpatient Medications Prior to Visit  Medication Sig Dispense Refill  . acetaminophen (TYLENOL) 500 MG tablet Take 500 mg by mouth every 6 (six) hours as needed for pain.     Marland Kitchen amLODipine (NORVASC) 10 MG tablet Take 1 tablet (10 mg total) by mouth daily. 90 tablet 3  . BIOTIN FORTE PO Take 1 tablet by mouth daily.    Marland Kitchen CALCIUM PO Take 1 tablet by mouth daily.     . Cholecalciferol (VITAMIN D) 1000 UNITS capsule Take 1,000 Units by mouth daily.     . colesevelam (WELCHOL) 625 MG tablet Take 3 tablets (1,875 mg total) by mouth 2 (two) times daily with a meal. 180 tablet 5  . Cranberry 500 MG CAPS Take 2 capsules by mouth daily.     Marland Kitchen etodolac (LODINE) 500 MG tablet TAKE ONE TABLET BY MOUTH TWICE DAILY 90 tablet 3  . fish oil-omega-3 fatty acids 1000 MG capsule Take 2 g by mouth daily.     Marland Kitchen glucosamine-chondroitin 500-400 MG tablet Take 1 tablet by mouth daily.    Marland Kitchen levothyroxine (SYNTHROID, LEVOTHROID) 25 MCG tablet TAKE ONE TABLET BY MOUTH ONCE DAILY 90 tablet 3  . loratadine (CLARITIN) 10 MG tablet Take 10 mg by mouth daily.      . Magnesium 100 MG CAPS Take 1 capsule by mouth 2 (two) times daily.     . meclizine (ANTIVERT) 25 MG tablet Take 1 tablet (25 mg total) by mouth 3 (three) times daily as needed for dizziness. 30 tablet 1  . metoprolol succinate (TOPROL-XL) 25 MG 24 hr tablet TAKE ONE TABLET BY MOUTH  ONCE DAILY 90 tablet 3  . Multiple Vitamin (MULTIVITAMIN) capsule Take 1 capsule by mouth daily.      . Polyvinyl Alcohol-Povidone (CLEAR EYES ALL SEASONS OP) Apply 1 drop to eye daily as needed (dry eyes).    . pyridOXINE (VITAMIN B-6) 100 MG tablet Take 100 mg by mouth daily.      . ranitidine (ZANTAC) 150 MG tablet Take 150 mg by mouth daily.     . vitamin E 400 UNIT capsule Take 400 Units by mouth daily.      . cephALEXin (KEFLEX) 500 MG capsule Take 1 capsule (500 mg total) by mouth 3 (three) times daily. (Patient not taking: Reported on 11/27/2016) 30 capsule 0   No facility-administered medications prior to visit.     Allergies:  Allergies  Allergen Reactions  . Advair Diskus [Fluticasone-Salmeterol]     Nervous and shakey  . Aspirin     If she takes too much, it burns her stomach  . Bactrim [Sulfamethoxazole-Trimethoprim] Nausea Only  . Demerol Nausea Only  . Meperidine Nausea Only    Sick on Stomach  . Qvar [Beclomethasone] Other (See Comments)    Headache  Review of Systems: See HPI for pertinent ROS. All other ROS negative.    Physical Exam: Blood pressure 118/82, pulse 67, temperature 98.2 F (36.8 C), temperature source Oral, resp. rate 18, weight 200 lb 3.2 oz (90.8 kg), SpO2 98 %., Body mass index is 37.83 kg/m. General:  WF. Appears in no acute distress. Neck: Supple. No thyromegaly. No lymphadenopathy. Lungs: Clear bilaterally to auscultation without wheezes, rales, or rhonchi. Breathing is unlabored. Heart: Regular rhythm. No murmurs, rubs, or gallops. Msk:  Strength and tone normal for age. Skin:  Right cheek--just in front of right ear--- there is verticle distribution of erythematous papulovessicular lesions. Right ear has area of erythema/swelling. Right neck has cluster of erythematous papulo-vessicular lesions. Scalp has multiple raised erythematous areas--- just anterior--posterior to right ear and down toward right occipital region.   All of these  are within C3 nerve distribution.  Neuro: Alert and oriented X 3. Moves all extremities spontaneously. Gait is normal. CNII-XII grossly in tact. Psych:  Responds to questions appropriately with a normal affect.     ASSESSMENT AND PLAN:  76 y.o. year old female with  1. Herpes zoster without complication She is to start the Valtrex and Gabapentin as soon as possible. Take these as directed. Can use Hydrocodone prn breakthrough pain.  Discussed that severity and duration of pain vary greatly from one person to next. If she gets up to taking one TID and still with discomfort, call me for further dosing instructions. Also discussed that after several weeks, she can try tapering down this med and see if pain is resolved or not.  She voices understanding and agrees.  A female friend is with her who says she lives close to pt and will check on pt etc.Pt lives alone but this friend aware of above.   - valACYclovir (VALTREX) 1000 MG tablet; Take 1 tablet (1,000 mg total) by mouth 3 (three) times daily.  Dispense: 21 tablet; Refill: 0 - gabapentin (NEURONTIN) 300 MG capsule; Day 1: Take one. Day 2: Take one twice a day. Day 3 and thereafter: Take one 3 times a day  Dispense: 90 capsule; Refill: 3 - HYDROcodone-acetaminophen (NORCO/VICODIN) 5-325 MG tablet; Take 1 tablet by mouth every 6 (six) hours as needed.  Dispense: 30 tablet; Refill: 0   Signed, [address] [place], [place], [facility] 11/28/2016 3:56 PM

## 2016-12-07 DIAGNOSIS — R935 Abnormal findings on diagnostic imaging of other abdominal regions, including retroperitoneum: Secondary | ICD-10-CM | POA: Diagnosis not present

## 2016-12-07 DIAGNOSIS — N281 Cyst of kidney, acquired: Secondary | ICD-10-CM | POA: Diagnosis not present

## 2016-12-15 DIAGNOSIS — R350 Frequency of micturition: Secondary | ICD-10-CM | POA: Diagnosis not present

## 2016-12-15 DIAGNOSIS — N390 Urinary tract infection, site not specified: Secondary | ICD-10-CM | POA: Diagnosis not present

## 2016-12-15 DIAGNOSIS — R351 Nocturia: Secondary | ICD-10-CM | POA: Diagnosis not present

## 2017-01-04 ENCOUNTER — Other Ambulatory Visit: Payer: Self-pay | Admitting: Family Medicine

## 2017-01-04 DIAGNOSIS — I10 Essential (primary) hypertension: Secondary | ICD-10-CM

## 2017-03-01 ENCOUNTER — Encounter: Payer: Medicare Other | Admitting: Nurse Practitioner

## 2017-03-12 ENCOUNTER — Encounter: Payer: Medicare Other | Admitting: Adult Health

## 2017-03-27 ENCOUNTER — Ambulatory Visit (HOSPITAL_BASED_OUTPATIENT_CLINIC_OR_DEPARTMENT_OTHER): Payer: Medicare Other | Admitting: Adult Health

## 2017-03-27 VITALS — BP 127/55 | HR 65 | Temp 98.0°F | Resp 18 | Ht 61.0 in | Wt 200.0 lb

## 2017-03-27 DIAGNOSIS — Z170 Estrogen receptor positive status [ER+]: Secondary | ICD-10-CM | POA: Diagnosis not present

## 2017-03-27 DIAGNOSIS — M810 Age-related osteoporosis without current pathological fracture: Secondary | ICD-10-CM

## 2017-03-27 DIAGNOSIS — M818 Other osteoporosis without current pathological fracture: Secondary | ICD-10-CM | POA: Diagnosis not present

## 2017-03-27 DIAGNOSIS — C50512 Malignant neoplasm of lower-outer quadrant of left female breast: Secondary | ICD-10-CM

## 2017-03-27 DIAGNOSIS — Z853 Personal history of malignant neoplasm of breast: Secondary | ICD-10-CM | POA: Diagnosis not present

## 2017-03-27 NOTE — Progress Notes (Signed)
CLINIC:  Survivorship   REASON FOR VISIT:  Routine follow-up for history of breast cancer.   BRIEF ONCOLOGIC HISTORY:    Breast cancer, left ([place])   05/11/2010 Surgery    Left breast lumpectomy: Invasive ductal carcinoma 6 mm, grade 2, margins negative, 0/2 sentinel nodes, ER 98%, PR 97%, HER-2 negative, Ki-67 18%, T1b N0 M0 stage IA      05/30/2010 - 07/06/2010 Radiation Therapy    Adjuvant radiation by Dr. Lisbeth Renshaw      07/08/2010 - 12/29/2015 Anti-estrogen oral therapy    Tamoxifen 20 mg daily        INTERVAL HISTORY:  Joan Weaver presents to the [facility] today for routine follow-up for her history of breast cancer.  Overall, she reports feeling quite well. Joan Weaver has several health concerns and issues regarding her bowels and nausea.  These are being managed by her PCP.  She denies any breast issues.  She sees her PCP regularly and is up to date with all of her cancer screenings.  She denies any breast related concerns at this point.      REVIEW OF SYSTEMS:  Review of Systems  Constitutional: Negative for appetite change, chills, diaphoresis, fatigue, fever and unexpected weight change.  HENT:   Negative for hearing loss and lump/mass.   Eyes: Negative for eye problems and icterus.  Respiratory: Negative for chest tightness and cough.   Cardiovascular: Negative for chest pain, leg swelling and palpitations.  Gastrointestinal: Negative for abdominal distention and abdominal pain.  Endocrine: Negative for hot flashes.  Genitourinary: Negative for difficulty urinating.   Musculoskeletal: Negative for arthralgias.  Skin: Negative for itching and rash.  Neurological: Negative for dizziness, extremity weakness, headaches and numbness.  Hematological: Negative for adenopathy. Does not bruise/bleed easily.  Psychiatric/Behavioral: Negative for depression. The patient is not nervous/anxious.   Breast: Denies any new nodularity, masses, tenderness, nipple changes, or nipple  discharge.       PAST MEDICAL/SURGICAL HISTORY:  Past Medical History:  Diagnosis Date  . Arthritis    knees, hips  . Breast cancer [facility]) August 2011   left - lumpectomy  . Cataract   . GERD (gastroesophageal reflux disease)    minor - tx with zantac  . H/O bladder infections   . Headache(784.0)    otc med prn  . Heart murmur    slight murmur -never had any problems  . Hernia   . Hypertension    no meds x 3 yrs  . Hypothyroidism   . Osteoporosis   . 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 Thyroid disease   . Ulcer ([place])   . Urinary incontinence    Past Surgical History:  Procedure Laterality Date  . APPENDECTOMY    . BREAST LUMPECTOMY     left  . EYE SURGERY     left- cataract   . HIATAL HERNIA REPAIR     x 2  . HYSTEROSCOPY W/D&C N/A 07/07/2013   Procedure: DILATATION AND CURETTAGE /HYSTEROSCOPY;  Surgeon: Anastasio Auerbach, MD;  Location: [facility];  Service: Gynecology;  Laterality: N/A;  . KNEE SURGERY     Arthroscopic  . left cataract surgery    . TONSILLECTOMY       ALLERGIES:  Allergies  Allergen Reactions  . Advair Diskus [Fluticasone-Salmeterol]     Nervous and shakey  . Aspirin     If she takes too much, it burns her stomach  .  Bactrim [Sulfamethoxazole-Trimethoprim] Nausea Only  . Demerol Nausea Only  . Meperidine Nausea Only    Sick on Stomach  . Qvar [Beclomethasone] Other (See Comments)    Headache     CURRENT MEDICATIONS:  Outpatient Encounter Prescriptions as of 03/27/2017  Medication Sig  . acetaminophen (TYLENOL) 500 MG tablet Take 500 mg by mouth every 6 (six) hours as needed for pain.   Marland Kitchen amLODipine (NORVASC) 10 MG tablet TAKE ONE TABLET BY MOUTH ONCE DAILY  . BIOTIN FORTE PO Take 1 tablet by mouth daily.  Marland Kitchen CALCIUM PO Take 1 tablet by mouth daily.   . Cholecalciferol (VITAMIN D) 1000 UNITS capsule Take 1,000 Units by mouth daily.   . colesevelam (WELCHOL) 625 MG tablet Take 3 tablets  (1,875 mg total) by mouth 2 (two) times daily with a meal.  . Cranberry 500 MG CAPS Take 2 capsules by mouth daily.   Marland Kitchen etodolac (LODINE) 500 MG tablet TAKE ONE TABLET BY MOUTH TWICE DAILY  . fish oil-omega-3 fatty acids 1000 MG capsule Take 2 g by mouth daily.   Marland Kitchen glucosamine-chondroitin 500-400 MG tablet Take 1 tablet by mouth daily.  Marland Kitchen levothyroxine (SYNTHROID, LEVOTHROID) 25 MCG tablet TAKE ONE TABLET BY MOUTH ONCE DAILY  . loratadine (CLARITIN) 10 MG tablet Take 10 mg by mouth daily.    . metoprolol succinate (TOPROL-XL) 25 MG 24 hr tablet TAKE ONE TABLET BY MOUTH ONCE DAILY  . Multiple Vitamin (MULTIVITAMIN) capsule Take 1 capsule by mouth daily.    . Polyvinyl Alcohol-Povidone (CLEAR EYES ALL SEASONS OP) Apply 1 drop to eye daily as needed (dry eyes).  . pyridOXINE (VITAMIN B-6) 100 MG tablet Take 100 mg by mouth daily.    . ranitidine (ZANTAC) 150 MG tablet Take 150 mg by mouth daily.   Marland Kitchen trimethoprim (TRIMPEX) 100 MG tablet Take 100 mg by mouth daily.  . vitamin E 400 UNIT capsule Take 400 Units by mouth daily.    . meclizine (ANTIVERT) 25 MG tablet Take 1 tablet (25 mg total) by mouth 3 (three) times daily as needed for dizziness. (Patient not taking: Reported on 03/27/2017)  . [DISCONTINUED] cephALEXin (KEFLEX) 500 MG capsule Take 1 capsule (500 mg total) by mouth 3 (three) times daily. (Patient not taking: Reported on 11/27/2016)  . [DISCONTINUED] gabapentin (NEURONTIN) 300 MG capsule Day 1: Take one. Day 2: Take one twice a day. Day 3 and thereafter: Take one 3 times a day (Patient not taking: Reported on 03/27/2017)  . [DISCONTINUED] HYDROcodone-acetaminophen (NORCO/VICODIN) 5-325 MG tablet Take 1 tablet by mouth every 6 (six) hours as needed. (Patient not taking: Reported on 03/27/2017)  . [DISCONTINUED] Magnesium 100 MG CAPS Take 1 capsule by mouth 2 (two) times daily.   . [DISCONTINUED] valACYclovir (VALTREX) 1000 MG tablet Take 1 tablet (1,000 mg total) by mouth 3 (three) times  daily. (Patient not taking: Reported on 03/27/2017)   No facility-administered encounter medications on file as of 03/27/2017.      ONCOLOGIC FAMILY HISTORY:  Family History  Problem Relation Age of Onset  . Prostate cancer Maternal Grandfather   . Cancer Paternal Grandfather        Stomach cancer  . Rheum arthritis Mother   . Heart disease Maternal Grandmother   . Breast cancer Maternal Aunt 70     SOCIAL HISTORY:  Joan Weaver is single and lives alone in [place], [place].   Joan Weaver is currently retired.  She denies any current or history of tobacco, alcohol, or  illicit drug use.     PHYSICAL EXAMINATION:  Vital Signs: Vitals:   03/27/17 1401  BP: (!) 127/55  Pulse: 65  Resp: 18  Temp: 98 F (36.7 C)   Filed Weights   03/27/17 1401  Weight: 200 lb (90.7 kg)   General: Well-nourished, well-appearing female in no acute distress.  Accompanied by a friend today.   HEENT: Head is normocephalic.  Pupils equal and reactive to light. Conjunctivae clear without exudate.  Sclerae anicteric. Oral mucosa is pink, moist.  Oropharynx is pink without lesions or erythema.  Lymph: No cervical, supraclavicular, or infraclavicular lymphadenopathy noted on palpation.  Cardiovascular: Regular rate and rhythm.Marland Kitchen Respiratory: Clear to auscultation bilaterally. Chest expansion symmetric; breathing non-labored.  Breast Exam:  -Left breast: No appreciable masses on palpation. No skin redness, thickening, or peau d'orange appearance; no nipple retraction or nipple discharge; mild distortion in symmetry at previous lumpectomy site well healed scar without erythema or nodularity.  -Right breast: No appreciable masses on palpation. No skin redness, thickening, or peau d'orange appearance; no nipple retraction or nipple discharge;  -Axilla: No axillary adenopathy bilaterally.  GI: Abdomen soft and round; non-tender, non-distended. Bowel sounds normoactive. No hepatosplenomegaly.   GU:  Deferred.  Neuro: No focal deficits. Steady gait.  Psych: Mood and affect normal and appropriate for situation.  MSK: No focal spinal tenderness to palpation, full range of motion in bilateral upper extremities Extremities: No edema. Skin: Warm and dry.  LABORATORY DATA:  None for this visit   DIAGNOSTIC IMAGING:  Most recent mammogram:     ASSESSMENT AND PLAN:  Joan Weaver is a pleasant 76 y.o. female with history of Stage IA left breast invasive ductal carcinoma, ER+/PR+/HER2-, diagnosed in 05/2010, treated with lumpectomy, adjuvant radiation therapy, and anti-estrogen therapy with Tamoxifen x 6 years.  She presents to the [facility] for surveillance and routine follow-up.   1. History of breast cancer:  Joan Weaver is currently clinically and radiographically without evidence of disease or recurrence of breast cancer. She will be due for mammogram in July, 2018; orders placed today.   I encouraged her to call me with any questions or concerns before her next visit at the cancer center, and I would be happy to see her sooner, if needed.    2. Pulmonary issues, GI Issues: I encouraged her to follow up with her PCP.  I explained to her what an important role her PCP has in her care and keeping them the anchor of her care team.    3. Bone health:  Given Joan Weaver's age, history of breast cancer,she is at risk for bone demineralization.  I went ahead and ordered a bone density as it is due for the same time when she has her mammogram done.  She was given education on specific food and activities to promote bone health.  4. Cancer screening:  Due to Joan Weaver's history and her age, she should receive screening for skin cancers, colon cancer. She was encouraged to follow-up with her PCP for appropriate cancer screenings.   5. Health maintenance and wellness promotion: Joan Weaver was encouraged to consume 5-7 servings of fruits and vegetables per day. She was also encouraged to engage in  moderate to vigorous exercise for 30 minutes per day most days of the week. She was instructed to limit her alcohol consumption and continue to abstain from tobacco use.    Dispo:  -Return to cancer center in one year for LTS follow up -Mammogram and bone  density in July, 2018.   A total of (30) minutes of face-to-face time was spent with this patient with greater than 50% of that time in counseling and care-coordination.   Gardenia Phlegm, NP Survivorship Program [facility] [phone]   Note: PRIMARY CARE PROVIDER [name], [place] [phone]

## 2017-03-28 ENCOUNTER — Encounter: Payer: Self-pay | Admitting: Adult Health

## 2017-04-20 DIAGNOSIS — R350 Frequency of micturition: Secondary | ICD-10-CM | POA: Diagnosis not present

## 2017-04-20 DIAGNOSIS — R300 Dysuria: Secondary | ICD-10-CM | POA: Diagnosis not present

## 2017-05-03 ENCOUNTER — Other Ambulatory Visit: Payer: Self-pay | Admitting: Family Medicine

## 2017-05-03 DIAGNOSIS — I10 Essential (primary) hypertension: Secondary | ICD-10-CM

## 2017-05-08 DIAGNOSIS — M8589 Other specified disorders of bone density and structure, multiple sites: Secondary | ICD-10-CM | POA: Diagnosis not present

## 2017-05-08 DIAGNOSIS — Z853 Personal history of malignant neoplasm of breast: Secondary | ICD-10-CM | POA: Diagnosis not present

## 2017-05-08 DIAGNOSIS — Z1231 Encounter for screening mammogram for malignant neoplasm of breast: Secondary | ICD-10-CM | POA: Diagnosis not present

## 2017-05-08 DIAGNOSIS — M810 Age-related osteoporosis without current pathological fracture: Secondary | ICD-10-CM | POA: Diagnosis not present

## 2017-05-08 LAB — HM MAMMOGRAPHY

## 2017-05-08 LAB — HM DEXA SCAN

## 2017-05-16 ENCOUNTER — Telehealth: Payer: Self-pay | Admitting: Emergency Medicine

## 2017-05-16 NOTE — Telephone Encounter (Signed)
Left patient VM in regards to bone density scan. Requested patient to call this nurse back to discuss.

## 2017-05-17 ENCOUNTER — Telehealth: Payer: Self-pay | Admitting: Emergency Medicine

## 2017-05-17 NOTE — Telephone Encounter (Signed)
Spoke with patient. Explained to her that her osteoporosis has slightly worsened per Jamelle Haring NP. NP reccommended patient to go see PCP. Patient verbalized understanding.

## 2017-05-28 ENCOUNTER — Encounter: Payer: Self-pay | Admitting: Family Medicine

## 2017-05-28 ENCOUNTER — Telehealth: Payer: Self-pay | Admitting: Family Medicine

## 2017-05-28 MED ORDER — ALENDRONATE SODIUM 70 MG PO TABS: 70.0000 mg | ORAL_TABLET | ORAL | 11 refills | Status: DC

## 2017-05-28 NOTE — Addendum Note (Signed)
Addended by: Shary Decamp B on: 05/28/2017 09:00 AM   Modules accepted: Orders

## 2017-05-28 NOTE — Telephone Encounter (Signed)
Per Dr. [name] dexa scan shows worsening of osteoporosis in right hip and would like pt to restart Fosamax 70mg  weekly. Will send pt letter with results and recommendations and prescription for medication.

## 2017-06-07 ENCOUNTER — Encounter: Payer: Self-pay | Admitting: Family Medicine

## 2017-06-13 ENCOUNTER — Encounter: Payer: Self-pay | Admitting: Family Medicine

## 2017-07-04 ENCOUNTER — Other Ambulatory Visit: Payer: Self-pay | Admitting: Family Medicine

## 2017-07-26 DIAGNOSIS — R350 Frequency of micturition: Secondary | ICD-10-CM | POA: Diagnosis not present

## 2017-07-26 DIAGNOSIS — R300 Dysuria: Secondary | ICD-10-CM | POA: Diagnosis not present

## 2017-09-27 ENCOUNTER — Ambulatory Visit: Payer: Medicare Other | Admitting: Family Medicine

## 2017-09-27 ENCOUNTER — Encounter: Payer: Self-pay | Admitting: Family Medicine

## 2017-09-27 ENCOUNTER — Other Ambulatory Visit: Payer: Self-pay

## 2017-09-27 VITALS — BP 118/64 | HR 83 | Temp 97.9°F | Resp 18 | Ht 61.0 in | Wt 199.0 lb

## 2017-09-27 DIAGNOSIS — J209 Acute bronchitis, unspecified: Secondary | ICD-10-CM | POA: Diagnosis not present

## 2017-09-27 MED ORDER — PREDNISONE 10 MG PO TABS: ORAL_TABLET | ORAL | 0 refills | Status: DC

## 2017-09-27 MED ORDER — LEVOFLOXACIN 500 MG PO TABS: 500.0000 mg | ORAL_TABLET | Freq: Every day | ORAL | 0 refills | Status: DC

## 2017-09-27 NOTE — Assessment & Plan Note (Signed)
Based on history, treat for presumptive bacterial bronchitis at this point, prednisone, levaquin to also cover her bladder since holding Trimethoprim. Coricidan for cough CXR if not improved after a week Offered albuterol but she does not tolerate

## 2017-09-27 NOTE — Progress Notes (Signed)
    Subjective:    Patient ID: Joan Weaver, female    DOB: 1941/05/17, 76 y.o.   MRN: [id number]  Patient presents for Illness (x1 week- productive cough with thick white sputum, postnasal drip, sore throat, chest congestion, chest pain from coughing)   Pt here with cough with production, intermittant wheeze for past few weeks. No fever. Had more post nasal drop and sore throat in beginning that has improved. She does get sorness beneath her shoulder blade and around ribs from coughing so much. 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 and PNA On Trimethoprim due to recurrent UTI    Review Of Systems:  GEN- denies fatigue, fever, weight loss,weakness, recent illness HEENT- denies eye drainage, change in vision, nasal discharge, CVS- denies chest pain, palpitations RESP- denies SOB,+ cough, +wheeze ABD- denies N/V, change in stools, abd pain GU- denies dysuria, hematuria, dribbling, incontinence MSK- denies joint pain, +muscle aches, injury Neuro- denies headache, dizziness, syncope, seizure activity       Objective:    BP 118/64   Pulse 83   Temp 97.9 F (36.6 C) (Oral)   Resp 18   Ht 5\' 1"  (1.549 m)   Wt 199 lb (90.3 kg)   SpO2 96%   BMI 37.60 kg/m  GEN- NAD, alert and oriented x3 HEENT- PERRL, EOMI, non injected sclera, pink conjunctiva, MMM, oropharynx clear, nares clear rhinorrhea, TM clear bilat, no maxillary sinus tenderness  Neck- Supple, no LAD CVS- RRR, no murmur RESP rhocnhi bilat, few scattered wheeze, harsh cough, normal WOB at rest  ABD-NABS,soft,NT,ND, no CVA tenderness  EXT- No edema Pulses- Radial 2+        Assessment & Plan:      Problem List Items Addressed This Visit      Unprioritized   Acute bronchitis - Primary    Based on history, treat for presumptive bacterial bronchitis at this point, prednisone, levaquin to also cover her bladder since holding Trimethoprim. Coricidan for cough CXR if  not improved after a week Offered albuterol but she does not tolerate          Note: This dictation was prepared with Dragon dictation along with smaller phrase technology. Any transcriptional errors that result from this process are unintentional.

## 2017-09-27 NOTE — Patient Instructions (Signed)
Take prednisone Take antibiotics Use RObitussim DM or Coricidan  F/U as needed

## 2017-10-25 DIAGNOSIS — R300 Dysuria: Secondary | ICD-10-CM | POA: Diagnosis not present

## 2017-10-25 DIAGNOSIS — R351 Nocturia: Secondary | ICD-10-CM | POA: Diagnosis not present

## 2017-12-29 ENCOUNTER — Other Ambulatory Visit: Payer: Self-pay | Admitting: Family Medicine

## 2017-12-29 DIAGNOSIS — I10 Essential (primary) hypertension: Secondary | ICD-10-CM

## 2018-02-05 DIAGNOSIS — R350 Frequency of micturition: Secondary | ICD-10-CM | POA: Diagnosis not present

## 2018-02-05 DIAGNOSIS — R300 Dysuria: Secondary | ICD-10-CM | POA: Diagnosis not present

## 2018-02-21 ENCOUNTER — Other Ambulatory Visit: Payer: Self-pay | Admitting: Family Medicine

## 2018-02-26 ENCOUNTER — Telehealth: Payer: Self-pay | Admitting: Family Medicine

## 2018-02-26 NOTE — Telephone Encounter (Signed)
Patient requires PA for [facility].

## 2018-02-26 NOTE — Telephone Encounter (Signed)
Pt requesting refill on welchol to [facility] pts pharmacy states that they are denying her refill because of [place].

## 2018-02-28 NOTE — Telephone Encounter (Signed)
Received call from patient.   Requested MD to advise if there is a comparable medication that she can take.

## 2018-03-01 ENCOUNTER — Other Ambulatory Visit: Payer: Self-pay | Admitting: *Deleted

## 2018-03-01 DIAGNOSIS — E039 Hypothyroidism, unspecified: Secondary | ICD-10-CM

## 2018-03-01 DIAGNOSIS — Z13220 Encounter for screening for lipoid disorders: Secondary | ICD-10-CM

## 2018-03-01 DIAGNOSIS — I10 Essential (primary) hypertension: Secondary | ICD-10-CM

## 2018-03-01 DIAGNOSIS — E559 Vitamin D deficiency, unspecified: Secondary | ICD-10-CM

## 2018-03-01 MED ORDER — CHOLESTYRAMINE 4 G PO PACK: 4.0000 g | PACK | Freq: Two times a day (BID) | ORAL | 12 refills | Status: DC

## 2018-03-01 NOTE — Telephone Encounter (Signed)
I haven't seen her since 2017.  I am not sure why she is on it????  However, she could try cholestyramine 4g pobid which is similar.  If she is taking it for cholesterol or her sugars, I haven't check lab work in >12 months.  Due for ov.

## 2018-03-01 NOTE — Telephone Encounter (Signed)
Call placed to patient.   States that she is taking Welchol for loose stools.   Appointment scheduled.

## 2018-03-05 ENCOUNTER — Other Ambulatory Visit: Payer: Medicare Other

## 2018-03-05 DIAGNOSIS — E559 Vitamin D deficiency, unspecified: Secondary | ICD-10-CM

## 2018-03-05 DIAGNOSIS — I10 Essential (primary) hypertension: Secondary | ICD-10-CM | POA: Diagnosis not present

## 2018-03-05 DIAGNOSIS — E039 Hypothyroidism, unspecified: Secondary | ICD-10-CM

## 2018-03-05 DIAGNOSIS — Z13220 Encounter for screening for lipoid disorders: Secondary | ICD-10-CM

## 2018-03-06 LAB — CBC WITH DIFFERENTIAL/PLATELET
BASOS ABS: 79 {cells}/uL (ref 0–200)
Basophils Relative: 1.2 %
EOS ABS: 231 {cells}/uL (ref 15–500)
EOS PCT: 3.5 %
HEMATOCRIT: 42.8 % (ref 35.0–45.0)
HEMOGLOBIN: 14.6 g/dL (ref 11.7–15.5)
Lymphs Abs: 2039 cells/uL (ref 850–3900)
MCH: 29.3 pg (ref 27.0–33.0)
MCHC: 34.1 g/dL (ref 32.0–36.0)
MCV: 85.9 fL (ref 80.0–100.0)
MPV: 10.7 fL (ref 7.5–12.5)
Monocytes Relative: 9.9 %
NEUTROS ABS: 3597 {cells}/uL (ref 1500–7800)
NEUTROS PCT: 54.5 %
Platelets: 264 10*3/uL (ref 140–400)
RBC: 4.98 10*6/uL (ref 3.80–5.10)
RDW: 13 % (ref 11.0–15.0)
Total Lymphocyte: 30.9 %
WBC mixed population: 653 cells/uL (ref 200–950)
WBC: 6.6 10*3/uL (ref 3.8–10.8)

## 2018-03-06 LAB — COMPLETE METABOLIC PANEL WITH GFR
AG RATIO: 1.6 (calc) (ref 1.0–2.5)
ALBUMIN MSPROF: 4.2 g/dL (ref 3.6–5.1)
ALKALINE PHOSPHATASE (APISO): 71 U/L (ref 33–130)
ALT: 16 U/L (ref 6–29)
AST: 20 U/L (ref 10–35)
BUN: 12 mg/dL (ref 7–25)
CO2: 28 mmol/L (ref 20–32)
CREATININE: 0.86 mg/dL (ref 0.60–0.93)
Calcium: 9.3 mg/dL (ref 8.6–10.4)
Chloride: 104 mmol/L (ref 98–110)
GFR, EST NON AFRICAN AMERICAN: 65 mL/min/{1.73_m2} (ref >=60)
GFR, Est African American: 76 mL/min/{1.73_m2} (ref >=60)
GLOBULIN: 2.6 g/dL (ref 1.9–3.7)
Glucose, Bld: 97 mg/dL (ref 65–99)
POTASSIUM: 4.5 mmol/L (ref 3.5–5.3)
SODIUM: 141 mmol/L (ref 135–146)
Total Bilirubin: 0.6 mg/dL (ref 0.2–1.2)
Total Protein: 6.8 g/dL (ref 6.1–8.1)

## 2018-03-06 LAB — LIPID PANEL
CHOL/HDL RATIO: 2.6 (calc) (ref <5.0)
CHOLESTEROL: 194 mg/dL (ref <200)
HDL: 75 mg/dL (ref >50)
LDL Cholesterol (Calc): 94 mg/dL (calc)
NON-HDL CHOLESTEROL (CALC): 119 mg/dL (ref <130)
Triglycerides: 145 mg/dL (ref <150)

## 2018-03-06 LAB — TSH: TSH: 2.57 m[IU]/L (ref 0.40–4.50)

## 2018-03-06 LAB — VITAMIN D 25 HYDROXY (VIT D DEFICIENCY, FRACTURES): VIT D 25 HYDROXY: 34 ng/mL (ref 30–100)

## 2018-03-07 ENCOUNTER — Telehealth: Payer: Self-pay | Admitting: *Deleted

## 2018-03-07 ENCOUNTER — Encounter: Payer: Self-pay | Admitting: Family Medicine

## 2018-03-07 ENCOUNTER — Ambulatory Visit: Payer: Medicare Other | Admitting: Family Medicine

## 2018-03-07 VITALS — BP 118/68 | HR 83 | Temp 98.1°F | Resp 20 | Ht 61.0 in | Wt 202.0 lb

## 2018-03-07 DIAGNOSIS — M810 Age-related osteoporosis without current pathological fracture: Secondary | ICD-10-CM | POA: Diagnosis not present

## 2018-03-07 DIAGNOSIS — E039 Hypothyroidism, unspecified: Secondary | ICD-10-CM

## 2018-03-07 DIAGNOSIS — I10 Essential (primary) hypertension: Secondary | ICD-10-CM | POA: Diagnosis not present

## 2018-03-07 DIAGNOSIS — R29898 Other symptoms and signs involving the musculoskeletal system: Secondary | ICD-10-CM | POA: Diagnosis not present

## 2018-03-07 DIAGNOSIS — E559 Vitamin D deficiency, unspecified: Secondary | ICD-10-CM

## 2018-03-07 MED ORDER — ONDANSETRON HCL 4 MG PO TABS: 4.0000 mg | ORAL_TABLET | Freq: Three times a day (TID) | ORAL | 0 refills | Status: DC | PRN

## 2018-03-07 MED ORDER — RALOXIFENE HCL 60 MG PO TABS: 60.0000 mg | ORAL_TABLET | Freq: Every day | ORAL | 5 refills | Status: DC

## 2018-03-07 NOTE — Telephone Encounter (Signed)
Your information has been submitted to [employer] [place]. [employer] [place] will review the request and notify you of the determination decision directly, typically within 3 business days of your submission and once all necessary information is received.  You will also receive your request decision electronically. To check for an update later, open the request again from your dashboard.  If [employer] [place] has not responded within the specified timeframe or if you have any questions about your PA submission, contact [employer] [place] directly at (MAPD) [phone] or (PDP) [phone]. 

## 2018-03-07 NOTE — Progress Notes (Signed)
Subjective:    Patient ID: Joan Weaver, female    DOB: 1940/11/16, 77 y.o.   MRN: [id number]  HPI  Patient is here today for follow-up of her chronic medical problems.  She has a history of essential hypertension however her blood pressure remains well controlled at [place].  Unfortunately she is been having significant dental problems recently.  She is lost several teeth.  She is not taking Fosamax due to potential concerns over upcoming dental work that needs to be performed.  However she is on no treatment then for her osteoporosis.  Bone density test was performed last year and was significant for a T score less than -3 in the hip.  She is on calcium and vitamin D but otherwise is not treating her osteoporosis.  She is treating her hypothyroidism with levothyroxine and her most recent TSH is within therapeutic limits.  Immunizations are up-to-date.  Her mammogram is due in July of this year.  Most recent lab work as listed below: Appointment on 03/05/2018  Component Date Value Ref Range Status  . WBC 03/05/2018 6.6  3.8 - 10.8 Thousand/uL Final  . RBC 03/05/2018 4.98  3.80 - 5.10 Million/uL Final  . Hemoglobin 03/05/2018 14.6  11.7 - 15.5 g/dL Final  . HCT 03/05/2018 42.8  35.0 - 45.0 % Final  . MCV 03/05/2018 85.9  80.0 - 100.0 fL Final  . MCH 03/05/2018 29.3  27.0 - 33.0 pg Final  . MCHC 03/05/2018 34.1  32.0 - 36.0 g/dL Final  . RDW 03/05/2018 13.0  11.0 - 15.0 % Final  . Platelets 03/05/2018 264  140 - 400 Thousand/uL Final  . MPV 03/05/2018 10.7  7.5 - 12.5 fL Final  . Neutro Abs 03/05/2018 3,597  1,500 - 7,800 cells/uL Final  . Lymphs Abs 03/05/2018 2,039  850 - 3,900 cells/uL Final  . WBC mixed population 03/05/2018 653  200 - 950 cells/uL Final  . Eosinophils Absolute 03/05/2018 231  15 - 500 cells/uL Final  . Basophils Absolute 03/05/2018 79  0 - 200 cells/uL Final  . Neutrophils Relative % 03/05/2018 54.5  % Final  . Total Lymphocyte 03/05/2018 30.9  % Final  . Monocytes  Relative 03/05/2018 9.9  % Final  . Eosinophils Relative 03/05/2018 3.5  % Final  . Basophils Relative 03/05/2018 1.2  % Final  . Glucose, Bld 03/05/2018 97  65 - 99 mg/dL Final   Comment: .            Fasting reference interval .   . BUN 03/05/2018 12  7 - 25 mg/dL Final  . Creat 03/05/2018 0.86  0.60 - 0.93 mg/dL Final   Comment: For patients >86 years of age, the reference limit for Creatinine is approximately 13% higher for people identified as African-American. .   . GFR, Est Non African American 03/05/2018 65  > OR = 60 mL/min/1.32m2 Final  . GFR, Est African American 03/05/2018 76  > OR = 60 mL/min/1.85m2 Final  . BUN/Creatinine Ratio 80/99/8338 NOT APPLICABLE  6 - 22 (calc) Final  . Sodium 03/05/2018 141  135 - 146 mmol/L Final  . Potassium 03/05/2018 4.5  3.5 - 5.3 mmol/L Final  . Chloride 03/05/2018 104  98 - 110 mmol/L Final  . CO2 03/05/2018 28  20 - 32 mmol/L Final  . Calcium 03/05/2018 9.3  8.6 - 10.4 mg/dL Final  . Total Protein 03/05/2018 6.8  6.1 - 8.1 g/dL Final  . Albumin 03/05/2018 4.2  3.6 -  5.1 g/dL Final  . Globulin 03/05/2018 2.6  1.9 - 3.7 g/dL (calc) Final  . AG Ratio 03/05/2018 1.6  1.0 - 2.5 (calc) Final  . Total Bilirubin 03/05/2018 0.6  0.2 - 1.2 mg/dL Final  . Alkaline phosphatase (APISO) 03/05/2018 71  33 - 130 U/L Final  . AST 03/05/2018 20  10 - 35 U/L Final  . ALT 03/05/2018 16  6 - 29 U/L Final  . Cholesterol 03/05/2018 194  <200 mg/dL Final  . HDL 03/05/2018 75  >50 mg/dL Final  . Triglycerides 03/05/2018 145  <150 mg/dL Final  . LDL Cholesterol (Calc) 03/05/2018 94  mg/dL (calc) Final   Comment: Reference range: <100 . Desirable range <100 mg/dL for primary prevention;   <70 mg/dL for patients with CHD or diabetic patients  with > or = 2 CHD risk factors. Marland Kitchen LDL-C is now calculated using the Martin-Hopkins  calculation, which is a validated novel method providing  better accuracy than the Friedewald equation in the  estimation of LDL-C.   Cresenciano Genre et al. Annamaria Helling. 5643;329(51): 2061-2068  (http://education.[facility].com/faq/FAQ164)   . Total CHOL/HDL Ratio 03/05/2018 2.6  <5.0 (calc) Final  . Non-HDL Cholesterol (Calc) 03/05/2018 119  <130 mg/dL (calc) Final   Comment: For patients with diabetes plus 1 major ASCVD risk  factor, treating to a non-HDL-C goal of <100 mg/dL  (LDL-C of <70 mg/dL) is considered a therapeutic  option.   . TSH 03/05/2018 2.57  0.40 - 4.50 mIU/L Final  . Vit D, 25-Hydroxy 03/05/2018 34  30 - 100 ng/mL Final   Comment: Vitamin D Status         25-OH Vitamin D: . Deficiency:                    <20 ng/mL Insufficiency:             20 - 29 ng/mL Optimal:                 > or = 30 ng/mL . For 25-OH Vitamin D testing on patients on  D2-supplementation and patients for whom quantitation  of D2 and D3 fractions is required, the QuestAssureD(TM) 25-OH VIT D, (D2,D3), LC/MS/MS is recommended: order  code [phone] (patients >90yrs). . For more information on this test, go to: http://education.quest[facility].com/faq/FAQ163 (This link is being provided for  informational/educational purposes only.)    Past Medical History:  Diagnosis Date  . Arthritis    knees, hips  . Breast cancer [facility]) August 2011   left - lumpectomy  . Cataract   . GERD (gastroesophageal reflux disease)    minor - tx with zantac  . H/O bladder infections   . Headache(784.0)    otc med prn  . Heart murmur    slight murmur -never had any problems  . Hernia   . Hypertension    no meds x 3 yrs  . Hypothyroidism   . Osteoporosis   . 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 Thyroid disease   . Ulcer   . Urinary incontinence    Past Surgical History:  Procedure Laterality Date  . APPENDECTOMY    . BREAST LUMPECTOMY     left  . EYE SURGERY     left- cataract   . HIATAL HERNIA REPAIR     x 2  . HYSTEROSCOPY W/D&C N/A 07/07/2013   Procedure: DILATATION AND CURETTAGE  /HYSTEROSCOPY;  Surgeon: Anastasio Auerbach,  MD;  Location: [facility];  Service: Gynecology;  Laterality: N/A;  . KNEE SURGERY     Arthroscopic  . left cataract surgery    . TONSILLECTOMY     Current Outpatient Medications on File Prior to Visit  Medication Sig Dispense Refill  . acetaminophen (TYLENOL) 500 MG tablet Take 500 mg by mouth every 6 (six) hours as needed for pain.     Marland Kitchen amLODipine (NORVASC) 10 MG tablet TAKE 1 TABLET BY MOUTH ONCE DAILY 90 tablet 3  . BIOTIN FORTE PO Take 1 tablet by mouth daily.    Marland Kitchen CALCIUM PO Take 1 tablet by mouth daily.     . Cholecalciferol (VITAMIN D) 1000 UNITS capsule Take 1,000 Units by mouth daily.     . cholestyramine (QUESTRAN) 4 g packet Take 1 packet (4 g total) by mouth 2 (two) times daily. 60 each 12  . Cranberry 500 MG CAPS Take 2 capsules by mouth daily.     Marland Kitchen etodolac (LODINE) 500 MG tablet TAKE 1 TABLET BY MOUTH TWICE DAILY 90 tablet 3  . fish oil-omega-3 fatty acids 1000 MG capsule Take 2 g by mouth daily.     Marland Kitchen glucosamine-chondroitin 500-400 MG tablet Take 1 tablet by mouth daily.    Marland Kitchen levothyroxine (SYNTHROID, LEVOTHROID) 25 MCG tablet TAKE ONE TABLET BY MOUTH ONCE DAILY 90 tablet 3  . loratadine (CLARITIN) 10 MG tablet Take 10 mg by mouth daily.      . meclizine (ANTIVERT) 25 MG tablet Take 1 tablet (25 mg total) by mouth 3 (three) times daily as needed for dizziness. 30 tablet 1  . metoprolol succinate (TOPROL-XL) 25 MG 24 hr tablet TAKE ONE TABLET BY MOUTH ONCE DAILY 90 tablet 3  . Multiple Vitamin (MULTIVITAMIN) capsule Take 1 capsule by mouth daily.      . nitrofurantoin (MACRODANTIN) 100 MG capsule Take 100 mg by mouth daily.  11  . Polyvinyl Alcohol-Povidone (CLEAR EYES ALL SEASONS OP) Apply 1 drop to eye daily as needed (dry eyes).    . Probiotic Product (ALIGN PO) Take by mouth.    . pyridOXINE (VITAMIN B-6) 100 MG tablet Take 100 mg by mouth daily.      . ranitidine (ZANTAC) 150 MG tablet Take 150 mg by mouth daily.      No  current facility-administered medications on file prior to visit.    Allergies  Allergen Reactions  . Advair Diskus [Fluticasone-Salmeterol]     Nervous and shakey  . Aspirin     If she takes too much, it burns her stomach  . Bactrim [Sulfamethoxazole-Trimethoprim] Nausea Only  . Demerol Nausea Only  . Meperidine Nausea Only    Sick on Stomach  . Qvar [Beclomethasone] Other (See Comments)    Headache   Social History   Socioeconomic History  . Marital status: Widowed    Spouse name: Not on file  . Number of children: 0  . Years of education: Not on file  . Highest education level: Not on file  Occupational History  . Occupation: Retired     Comment: Producer, television/film/video  . Financial resource strain: Not on file  . Food insecurity:    Worry: Not on file    Inability: Not on file  . Transportation needs:    Medical: Not on file    Non-medical: Not on file  Tobacco Use  . Smoking status: Never Smoker  . Smokeless tobacco: Never Used  Substance and Sexual Activity  . Alcohol  use: No  . Drug use: No  . Sexual activity: Not Currently    Birth control/protection: Post-menopausal  Lifestyle  . Physical activity:    Days per week: Not on file    Minutes per session: Not on file  . Stress: Not on file  Relationships  . Social connections:    Talks on phone: Not on file    Gets together: Not on file    Attends religious service: Not on file    Active member of club or organization: Not on file    Attends meetings of clubs or organizations: Not on file    Relationship status: Not on file  . Intimate partner violence:    Fear of current or ex partner: Not on file    Emotionally abused: Not on file    Physically abused: Not on file    Forced sexual activity: Not on file  Other Topics Concern  . Not on file  Social History Narrative  . Not on file     Review of Systems  All other systems reviewed and are negative.      Objective:   Physical Exam    Constitutional: She appears well-developed and well-nourished. No distress.  Neck: No JVD present. No thyromegaly present.  Cardiovascular: Normal rate, regular rhythm and normal heart sounds.  No murmur heard. Pulmonary/Chest: Effort normal and breath sounds normal. No stridor. No respiratory distress. She has no wheezes.  Abdominal: Soft. Bowel sounds are normal. She exhibits no distension. There is no tenderness. There is no guarding.  Lymphadenopathy:    She has no cervical adenopathy.  Skin: She is not diaphoretic.  Vitals reviewed.         Assessment & Plan:  Benign essential HTN  Hypothyroidism, unspecified type  Vitamin D deficiency  Age-related osteoporosis without current pathological fracture  Weakness of both lower extremities - Plan: Ambulatory referral to Physical Therapy  Patient is noticeably weak on exam today particularly in her legs.  She is having to ambulate with a walker and a cane to keep her balance.  She is very deconditioned and is not doing much in the way of exercise due to her fear of falling.  I have recommended physical therapy to help with this.  Her blood pressure is well controlled.  Her cholesterol is outstanding.  Her levothyroxine dose is appropriate.  We had a long discussion today about treatment of osteoporosis and in addition to calcium and vitamin D, I have recommended beginning Evista 60 mg a day and discontinuing Fosamax.

## 2018-03-07 NOTE — Telephone Encounter (Signed)
Received request from pharmacy for PA on zofran.   PA submitted.   Dx: R11.2- N/V

## 2018-03-18 NOTE — Telephone Encounter (Signed)
Received PA determination.   PA denied.  

## 2018-03-28 ENCOUNTER — Inpatient Hospital Stay: Payer: Medicare Other | Attending: Adult Health | Admitting: Adult Health

## 2018-03-28 ENCOUNTER — Telehealth: Payer: Self-pay | Admitting: Adult Health

## 2018-03-28 ENCOUNTER — Encounter: Payer: Self-pay | Admitting: Adult Health

## 2018-03-28 VITALS — BP 133/68 | HR 66 | Temp 98.0°F | Resp 18 | Ht 61.0 in | Wt 200.0 lb

## 2018-03-28 DIAGNOSIS — Z79899 Other long term (current) drug therapy: Secondary | ICD-10-CM | POA: Insufficient documentation

## 2018-03-28 DIAGNOSIS — Z923 Personal history of irradiation: Secondary | ICD-10-CM | POA: Diagnosis not present

## 2018-03-28 DIAGNOSIS — Z800 Family history of malignant neoplasm of digestive organs: Secondary | ICD-10-CM | POA: Diagnosis not present

## 2018-03-28 DIAGNOSIS — M1990 Unspecified osteoarthritis, unspecified site: Secondary | ICD-10-CM | POA: Diagnosis not present

## 2018-03-28 DIAGNOSIS — Z79810 Long term (current) use of selective estrogen receptor modulators (SERMs): Secondary | ICD-10-CM | POA: Insufficient documentation

## 2018-03-28 DIAGNOSIS — Z803 Family history of malignant neoplasm of breast: Secondary | ICD-10-CM | POA: Insufficient documentation

## 2018-03-28 DIAGNOSIS — Z8042 Family history of malignant neoplasm of prostate: Secondary | ICD-10-CM | POA: Diagnosis not present

## 2018-03-28 DIAGNOSIS — M810 Age-related osteoporosis without current pathological fracture: Secondary | ICD-10-CM | POA: Insufficient documentation

## 2018-03-28 DIAGNOSIS — E039 Hypothyroidism, unspecified: Secondary | ICD-10-CM | POA: Insufficient documentation

## 2018-03-28 DIAGNOSIS — I10 Essential (primary) hypertension: Secondary | ICD-10-CM | POA: Diagnosis not present

## 2018-03-28 DIAGNOSIS — Z170 Estrogen receptor positive status [ER+]: Secondary | ICD-10-CM | POA: Diagnosis not present

## 2018-03-28 DIAGNOSIS — C50912 Malignant neoplasm of unspecified site of left female breast: Secondary | ICD-10-CM | POA: Diagnosis not present

## 2018-03-28 DIAGNOSIS — K219 Gastro-esophageal reflux disease without esophagitis: Secondary | ICD-10-CM | POA: Diagnosis not present

## 2018-03-28 DIAGNOSIS — C50512 Malignant neoplasm of lower-outer quadrant of left female breast: Secondary | ICD-10-CM

## 2018-03-28 NOTE — Telephone Encounter (Signed)
Gave avs and calendar ° °

## 2018-03-28 NOTE — Progress Notes (Signed)
CLINIC:  Survivorship   REASON FOR VISIT:  Routine follow-up for history of breast cancer.   BRIEF ONCOLOGIC HISTORY:    Breast cancer, left ([place])   05/11/2010 Surgery    Left breast lumpectomy: Invasive ductal carcinoma 6 mm, grade 2, margins negative, 0/2 sentinel nodes, ER 98%, PR 97%, HER-2 negative, Ki-67 18%, T1b N0 M0 stage IA      05/30/2010 - 07/06/2010 Radiation Therapy    Adjuvant radiation by Dr. Lisbeth Renshaw      07/08/2010 - 12/29/2015 Anti-estrogen oral therapy    Tamoxifen 20 mg daily        INTERVAL HISTORY:  Ms. Kiehn presents to the [facility] today for routine follow-up for her history of breast cancer.  Overall, she reports feeling quite well. She sees her PCP regularly.  She is up to date with cancer screenings, except skin cancer screening.  She is in the process of being referred for physical therapy to work on strengthening her muscles.      REVIEW OF SYSTEMS:  Review of Systems  Constitutional: Negative for appetite change, chills, fatigue, fever and unexpected weight change.  HENT:   Negative for hearing loss, lump/mass, sore throat and trouble swallowing.   Eyes: Negative for eye problems and icterus.  Respiratory: Negative for chest tightness, cough and shortness of breath.   Cardiovascular: Negative for chest pain, leg swelling and palpitations.  Gastrointestinal: Negative for abdominal distention, abdominal pain, constipation, diarrhea, nausea and vomiting.  Endocrine: Negative for hot flashes.  Musculoskeletal: Positive for arthralgias.  Skin: Negative for itching and rash.  Neurological: Negative for dizziness, extremity weakness, headaches and numbness.  Hematological: Negative for adenopathy. Does not bruise/bleed easily.  Psychiatric/Behavioral: Negative for depression. The patient is not nervous/anxious.   Breast: Denies any new nodularity, masses, tenderness, nipple changes, or nipple discharge.       PAST MEDICAL/SURGICAL  HISTORY:  Past Medical History:  Diagnosis Date  . Arthritis    knees, hips  . Breast cancer [facility]) August 2011   left - lumpectomy  . Cataract   . GERD (gastroesophageal reflux disease)    minor - tx with zantac  . H/O bladder infections   . Headache(784.0)    otc med prn  . Heart murmur    slight murmur -never had any problems  . Hernia   . Hypertension    no meds x 3 yrs  . Hypothyroidism   . Osteoporosis   . 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 Thyroid disease   . Ulcer   . Urinary incontinence    Past Surgical History:  Procedure Laterality Date  . APPENDECTOMY    . BREAST LUMPECTOMY     left  . EYE SURGERY     left- cataract   . HIATAL HERNIA REPAIR     x 2  . HYSTEROSCOPY W/D&C N/A 07/07/2013   Procedure: DILATATION AND CURETTAGE /HYSTEROSCOPY;  Surgeon: Anastasio Auerbach, MD;  Location: [facility];  Service: Gynecology;  Laterality: N/A;  . KNEE SURGERY     Arthroscopic  . left cataract surgery    . TONSILLECTOMY       ALLERGIES:  Allergies  Allergen Reactions  . Advair Diskus [Fluticasone-Salmeterol]     Nervous and shakey  . Aspirin     If she takes too much, it burns her stomach  . Bactrim [Sulfamethoxazole-Trimethoprim] Nausea Only  . Demerol Nausea Only  . Meperidine Nausea Only  Sick on Stomach  . Qvar [Beclomethasone] Other (See Comments)    Headache     CURRENT MEDICATIONS:  Outpatient Encounter Medications as of 03/28/2018  Medication Sig  . acetaminophen (TYLENOL) 500 MG tablet Take 500 mg by mouth every 6 (six) hours as needed for pain.   Marland Kitchen amLODipine (NORVASC) 10 MG tablet TAKE 1 TABLET BY MOUTH ONCE DAILY  . BIOTIN FORTE PO Take 1 tablet by mouth daily.  Marland Kitchen CALCIUM PO Take 1 tablet by mouth daily.   . Cholecalciferol (VITAMIN D) 1000 UNITS capsule Take 1,000 Units by mouth daily.   . cholestyramine (QUESTRAN) 4 g packet Take 1 packet (4 g total) by mouth 2 (two) times daily.  . Cranberry 500  MG CAPS Take 2 capsules by mouth daily.   Marland Kitchen etodolac (LODINE) 500 MG tablet TAKE 1 TABLET BY MOUTH TWICE DAILY  . fish oil-omega-3 fatty acids 1000 MG capsule Take 2 g by mouth daily.   Marland Kitchen glucosamine-chondroitin 500-400 MG tablet Take 1 tablet by mouth daily.  Marland Kitchen levothyroxine (SYNTHROID, LEVOTHROID) 25 MCG tablet TAKE ONE TABLET BY MOUTH ONCE DAILY  . loratadine (CLARITIN) 10 MG tablet Take 10 mg by mouth daily.    . meclizine (ANTIVERT) 25 MG tablet Take 1 tablet (25 mg total) by mouth 3 (three) times daily as needed for dizziness.  . metoprolol succinate (TOPROL-XL) 25 MG 24 hr tablet TAKE ONE TABLET BY MOUTH ONCE DAILY  . Multiple Vitamin (MULTIVITAMIN) capsule Take 1 capsule by mouth daily.    . nitrofurantoin (MACRODANTIN) 100 MG capsule Take 100 mg by mouth daily.  . Polyvinyl Alcohol-Povidone (CLEAR EYES ALL SEASONS OP) Apply 1 drop to eye daily as needed (dry eyes).  . Probiotic Product (ALIGN PO) Take by mouth.  . pyridOXINE (VITAMIN B-6) 100 MG tablet Take 100 mg by mouth daily.    . raloxifene (EVISTA) 60 MG tablet Take 1 tablet (60 mg total) by mouth daily.  . ranitidine (ZANTAC) 150 MG tablet Take 150 mg by mouth daily.   . ondansetron (ZOFRAN) 4 MG tablet Take 1 tablet (4 mg total) by mouth every 8 (eight) hours as needed for nausea or vomiting. (Patient not taking: Reported on 03/28/2018)   No facility-administered encounter medications on file as of 03/28/2018.      ONCOLOGIC FAMILY HISTORY:  Family History  Problem Relation Age of Onset  . Prostate cancer Maternal Grandfather   . Cancer Paternal Grandfather        Stomach cancer  . Rheum arthritis Mother   . Heart disease Maternal Grandmother   . Breast cancer Maternal Aunt 70    SOCIAL HISTORY:  Social History   Socioeconomic History  . Marital status: Widowed    Spouse name: Not on file  . Number of children: 0  . Years of education: Not on file  . Highest education level: Not on file  Occupational History    . Occupation: Retired     Comment: Producer, television/film/video  . Financial resource strain: Not on file  . Food insecurity:    Worry: Not on file    Inability: Not on file  . Transportation needs:    Medical: Not on file    Non-medical: Not on file  Tobacco Use  . Smoking status: Never Smoker  . Smokeless tobacco: Never Used  Substance and Sexual Activity  . Alcohol use: No  . Drug use: No  . Sexual activity: Not Currently    Birth control/protection: Post-menopausal  Lifestyle  . Physical activity:    Days per week: Not on file    Minutes per session: Not on file  . Stress: Not on file  Relationships  . Social connections:    Talks on phone: Not on file    Gets together: Not on file    Attends religious service: Not on file    Active member of club or organization: Not on file    Attends meetings of clubs or organizations: Not on file    Relationship status: Not on file  . Intimate partner violence:    Fear of current or ex partner: Not on file    Emotionally abused: Not on file    Physically abused: Not on file    Forced sexual activity: Not on file  Other Topics Concern  . Not on file  Social History Narrative  . Not on file      PHYSICAL EXAMINATION:  Vital Signs: Vitals:   03/28/18 1343  BP: 133/68  Pulse: 66  Resp: 18  Temp: 98 F (36.7 C)  SpO2: 99%   Filed Weights   03/28/18 1343  Weight: 200 lb (90.7 kg)   General: Well-nourished, well-appearing female in no acute distress.  Unaccompanied today.   HEENT: Head is normocephalic.  Pupils equal and reactive to light. Conjunctivae clear without exudate.  Sclerae anicteric. Oral mucosa is pink, moist.  Oropharynx is pink without lesions or erythema.  Lymph: No cervical, supraclavicular, or infraclavicular lymphadenopathy noted on palpation.  Cardiovascular: Regular rate and rhythm.Marland Kitchen Respiratory: Clear to auscultation bilaterally. Chest expansion symmetric; breathing non-labored.  Breast Exam:  -Left  breast: No appreciable masses on palpation. No skin redness, thickening, or peau d'orange appearance; no nipple retraction or nipple discharge; mild distortion in symmetry at previous lumpectomy site well healed scar without erythema or nodularity.  -Right breast: No appreciable masses on palpation. No skin redness, thickening, or peau d'orange appearance; no nipple retraction or nipple discharge; -Axilla: No axillary adenopathy bilaterally.  GI: Abdomen soft and round; non-tender, non-distended. Bowel sounds normoactive. No hepatosplenomegaly.   GU: Deferred.  Neuro: No focal deficits. Steady gait.  Psych: Mood and affect normal and appropriate for situation.  MSK: No focal spinal tenderness to palpation, full range of motion in bilateral upper extremities Extremities: No edema. Skin: Warm and dry.  LABORATORY DATA:  None for this visit   DIAGNOSTIC IMAGING:  Most recent mammogram:     Most recent bone density:     ASSESSMENT AND PLAN:  Ms.. Ueda is a pleasant 77 y.o. female with history of Stage IA left breast invasive ductal carcinoma, ER+/PR+/HER2-, diagnosed in 05/2010, treated with lumpectomy, adjuvant radiation therapy, and anti-estrogen therapy with Tamoxifen x 5 years completing therapy in 12/2015.  [facility] for surveillance and routine follow-up.   1. History of breast cancer:  Ms. Cortez is currently clinically and radiographically without evidence of disease or recurrence of breast cancer. She will be due for mammogram in 04/2018.  She will return in one year for LTS follow up.  She was offered the opportunity to "graduate" today, but would like to come here annually as she enjoys these visits.  I encouraged her to call me with any questions or concerns before her next visit at the cancer center, and I would be happy to see her sooner, if needed.    2. Bone health:  Given Ms. Feher's age, history of breast cancer, she is at risk for bone  demineralization.  Her last DEXA scan is noted above and she has started back on Evista under the care of Dr. [name].  I will defer to her PCP regarding bone density testing and management.  She was given education on specific food and activities to promote bone health.  3. Cancer screening:  Due to Ms. Stangeland's history and her age, she should receive screening for skin cancers, colon cancer. She was encouraged to follow-up with her PCP for appropriate cancer screenings.   4. Health maintenance and wellness promotion: Ms. Holsworth was encouraged to consume 5-7 servings of fruits and vegetables per day. She was also encouraged to engage in moderate to vigorous exercise for 30 minutes per day most days of the week. She was instructed to limit her alcohol consumption and continue to abstain from tobacco use.    Dispo:  -Return to cancer center in one year for LTS follow up -Mammogram in 04/2018   A total of (30) minutes of face-to-face time was spent with this patient with greater than 50% of that time in counseling and care-coordination.   Gardenia Phlegm, NP Survivorship Program [facility] [phone]   Note: PRIMARY CARE PROVIDER [name], [place] [phone]

## 2018-04-18 ENCOUNTER — Ambulatory Visit: Payer: Medicare Other | Attending: Family Medicine | Admitting: Rehabilitation

## 2018-04-18 ENCOUNTER — Encounter: Payer: Self-pay | Admitting: Rehabilitation

## 2018-04-18 DIAGNOSIS — R2689 Other abnormalities of gait and mobility: Secondary | ICD-10-CM | POA: Diagnosis not present

## 2018-04-18 DIAGNOSIS — R293 Abnormal posture: Secondary | ICD-10-CM | POA: Diagnosis not present

## 2018-04-18 DIAGNOSIS — R2681 Unsteadiness on feet: Secondary | ICD-10-CM | POA: Diagnosis not present

## 2018-04-18 DIAGNOSIS — M6281 Muscle weakness (generalized): Secondary | ICD-10-CM | POA: Insufficient documentation

## 2018-04-19 NOTE — Therapy (Signed)
[facility] [address] [place], [place], [address] Phone: [phone]   Fax:  [phone]  Physical Therapy Evaluation  Patient Details  Name: Joan Weaver MRN: [id number] Date of Birth: 1941-05-04 Referring Provider: Jenna Luo, MD   Encounter Date: 04/18/2018  PT End of Session - 04/19/18 0808    Visit Number  1    Number of Visits  13    Date for PT Re-Evaluation  52/77/82 cert written for 90, plan to see 60 days    Authorization Type  Medicare    PT Start Time  1532    PT Stop Time  1616    PT Time Calculation (min)  44 min    Activity Tolerance  Patient limited by fatigue    Behavior During Therapy  [facility] for tasks assessed/performed       Past Medical History:  Diagnosis Date  . Arthritis    knees, hips  . Breast cancer [facility]) August 2011   left - lumpectomy  . Cataract   . GERD (gastroesophageal reflux disease)    minor - tx with zantac  . H/O bladder infections   . Headache(784.0)    otc med prn  . Heart murmur    slight murmur -never had any problems  . Hernia   . Hypertension    no meds x 3 yrs  . Hypothyroidism   . Osteoporosis   . 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 Thyroid disease   . Ulcer   . Urinary incontinence     Past Surgical History:  Procedure Laterality Date  . APPENDECTOMY    . BREAST LUMPECTOMY     left  . EYE SURGERY     left- cataract   . HIATAL HERNIA REPAIR     x 2  . HYSTEROSCOPY W/D&C N/A 07/07/2013   Procedure: DILATATION AND CURETTAGE /HYSTEROSCOPY;  Surgeon: Anastasio Auerbach, MD;  Location: [facility];  Service: Gynecology;  Laterality: N/A;  . KNEE SURGERY     Arthroscopic  . left cataract surgery    . TONSILLECTOMY      There were no vitals filed for this visit.   Subjective Assessment - 04/18/18 1538    Subjective  Pt presents with BLE weakness.  Pt reports "I just don't have any strength in my legs and  I give out very soon.  This started with my breast cancer and radiation.  I also fell a year and half ago and the doctor is concerned about my bones."      Pertinent History  breast cancer (in remission), osteoporosis    Limitations  House hold activities;Walking;Standing    How long can you walk comfortably?  sometimes from one room to the next     Patient Stated Goals  "to be able to walk normally and stand and take better care of my house."     Currently in Pain?  Yes chronic knee pain    Pain Score  3     Pain Location  Knee    Pain Orientation  Right    Pain Descriptors / Indicators  Aching    Pain Type  Chronic pain    Pain Onset  More than a month ago    Pain Frequency  Constant    Aggravating Factors   walking    Pain Relieving Factors  cream, rest  [facility] PT Assessment - 04/18/18 1542      Assessment   Medical Diagnosis  BLE weakness, balance    Referring Provider  Jenna Luo, MD    Onset Date/Surgical Date  -- approx 2011 when had breast ca/radiation      Precautions   Precautions  Fall    Precaution Comments  osteoporosis      Balance Screen   Has the patient fallen in the past 6 months  Yes    How many times?  1    Has the patient had a decrease in activity level because of a fear of falling?   Yes    Is the patient reluctant to leave their home because of a fear of falling?   Yes      [place]  Private residence    Living Arrangements  Alone    Available Help at Discharge  Friend(s);Family;Available PRN/intermittently    Type of Home  House    Home Access  Stairs to enter    Entrance Stairs-Number of Steps  3    Entrance Stairs-Rails  Right    Home Layout  One level    [place] - 4 wheels;Cane - single point;Grab bars - tub/shower tub/shower       Prior Function   Level of Independence  Independent with household mobility with device;Independent with community mobility with device    Vocation  Retired     Leisure  Going to [employer], goes out to eat, grocery shopping (rides motorized cart)      Cognition   Overall Cognitive Status  Within Functional Limits for tasks assessed      Sensation   Light Touch  Impaired Detail    Light Touch Impaired Details  Impaired LLE some numbness in LLE from fall    Proprioception  Appears Intact      Coordination   Gross Motor Movements are Fluid and Coordinated  Yes    Fine Motor Movements are Fluid and Coordinated  Yes    Heel Shin Test  WFL      ROM / Strength   AROM / PROM / Strength  Strength      Strength   Overall Strength  Deficits    Overall Strength Comments  overall seated strength 4/5, B hip flex 3+/5 (likely hip abd weakness due to gait pattern deviations).       Transfers   Transfers  Sit to Stand;Stand to Sit    Sit to Stand  6: Modified independent (Device/Increase time)    Five time sit to stand comments   15.25 secs without UE support    Stand to Sit  6: Modified independent (Device/Increase time)      Ambulation/Gait   Ambulation/Gait  Yes    Ambulation/Gait Assistance  5: Supervision;4: Min guard    Ambulation/Gait Assistance Details  Pt ambulatory with cane in clinic today.  Note pt to be unsteady with wide BOS, antalgic gait pattern likely due to B knee pain.      Ambulation Distance (Feet)  115 Feet    Assistive device  Straight cane    Gait Pattern  Step-to pattern;Step-through pattern;Decreased stride length;Trendelenburg;Wide base of support;Trunk flexed    Ambulation Surface  Level;Indoor    Gait velocity  1.97 ft/sec with cane and min/guard to close S    Stairs  Yes    Stairs Assistance  4: Lindy Details (indicate cue  type and reason)  Had pt use R rail for stairs as she would at home.  She requires min A initially progressing to S level.  Note increased difficulty descending stairs.     Stair Management Technique  One rail Right;Two rails;Step to pattern;Forwards    Number of Stairs  4     Height of Stairs  6    Gait Comments  Discussed her using RW/rollator in community for more support, balance and to decrease pressure on B knees.  Pt reports she does have a RW that her husband used.  Discussed using rollator in house, using cane to get down stairs and keeping RW in car so that she can use when she is out in community.  Note that she does not do very much walking in community at this time, but does get into grocery store/restaurant with cane then sits or uses motorized cart.        Standardized Balance Assessment   Standardized Balance Assessment  Berg Balance Test      Berg Balance Test   Sit to Stand  Able to stand without using hands and stabilize independently    Standing Unsupported  Able to stand safely 2 minutes    Sitting with Back Unsupported but Feet Supported on Floor or Stool  Able to sit safely and securely 2 minutes    Stand to Sit  Sits safely with minimal use of hands    Transfers  Able to transfer safely, minor use of hands    Standing Unsupported with Eyes Closed  Able to stand 10 seconds with supervision    Standing Ubsupported with Feet Together  Needs help to attain position but able to stand for 30 seconds with feet together                Objective measurements completed on examination: See above findings.              PT Education - 04/19/18 0807    Education Details  evaluation findings, goals, POC, using RW in community for improved safety    Person(s) Educated  Patient    Methods  Explanation    Comprehension  Verbalized understanding       PT Short Term Goals - 04/19/18 0817      PT SHORT TERM GOAL #1   Title  Pt will be independent with initial HEP in order to indicate decreased fall risk and improved functional strength.  (Target Date: 05/18/18)    Time  4    Period  Weeks    Status  New    Target Date  05/18/18      PT SHORT TERM GOAL #2   Title  Pt will complete BERG balance test and improve score 4 points from  baseline in order to indicate decreased fall risk.      Time  4    Period  Weeks    Status  New      PT SHORT TERM GOAL #3   Title  Will assess 6MWT and improve distance by 9' in order to indicate improved functional endurance.      Time  4    Period  Weeks    Status  New      PT SHORT TERM GOAL #4   Title  Pt will improve gait speed to >/=2.56 ft/sec w/ LRAD in order to indicate improved efficiency of gait and decreased fall risk.     Time  4  Period  Weeks    Status  New      PT SHORT TERM GOAL #5   Title  Pt will ambulate x 300' w/ LRAD at mod I level over paved unlevel outdoor surfaces (including curb) in order to indicate safe community negotiation.     Time  4    Period  Weeks    Status  New        PT Long Term Goals - 04/19/18 0820      PT LONG TERM GOAL #1   Title  Pt will be independent with final HEP in order to indicate decreased fall risk and improved functional strength.  (Target Date: 06/17/18)    Time  8    Period  Weeks    Status  New    Target Date  06/17/18      PT LONG TERM GOAL #2   Title  Pt will improve BERG balance test 8 points from baseline in order to indicate decreased fall risk.     Time  8    Period  Weeks    Status  New      PT LONG TERM GOAL #3   Title  Pt will improve 6MWT distance by 150' from baseline in order to indicate improved functional endurance.     Time  8    Period  Weeks    Status  New      PT LONG TERM GOAL #4   Title  Pt will ambulate with gait speed >/=2.62 ft/sec in order to indicate safe community ambulation.     Time  8    Period  Weeks    Status  New      PT LONG TERM GOAL #5   Title  Pt will report being able to tolerate short shopping trips with use of RW/shopping cart in order to indicate improved community ambulation/endurance.      Time  8    Period  Weeks    Status  New      Additional Long Term Goals   Additional Long Term Goals  Yes      PT LONG TERM GOAL #6   Title  Pt will negotiate up/down 4  steps with single rail at mod I level in reciprocal pattern in order to indicate improved functional strength.     Time  8    Period  Weeks    Status  New             Plan - 04/19/18 0809    Clinical Impression Statement  Pt presents with BLE weakness, decreased balance and decreased endurance with mild sensory changes in LLE from previous fall.  Note history of breast cancer and radiation in 2011 which she reports caused some lung scarring and this impacts her breathing/endurance and she also endorses this is when she began to feel weak.  Also note in history per bone scan she has osteoporosis, more significantly in B hips and is unsteady with use of cane in community (uses rollator in home).  Discussed her using RW in community to decrease fall risk and improve support.  Upon PT evaluation, note 5TSS was 15.25 secs without UE support with mild support of backs of legs on chair for support indicative of fall risk and decreased functional strength, gait speed of 1.96 ft/sec with cane at min/guard to S level indicative of decreased community ambulator and below normal for her age group.  Began BERG balance test, however did  not get to finish due to time constraint, however feel she will likely be in moderate fall risk based on this test.  Pt will benefit from skilled OP neuro PT in order to address deficits.     History and Personal Factors relevant to plan of care:  hx of breast ca, radiation, osteoporosis    Clinical Presentation  Evolving    Clinical Presentation due to:  see above    Clinical Decision Making  Moderate    Rehab Potential  Good    Clinical Impairments Affecting Rehab Potential  co-morbidities, current state of B knees due to arthritis     PT Frequency  2x / week then 1x/wk for 4 more weeks    PT Duration  4 weeks then 1x/wk for 4 more weeks    PT Treatment/Interventions  ADLs/Self Care Home Management;DME Instruction;Gait training;Stair training;Functional mobility  training;Therapeutic activities;Therapeutic exercise;Balance training;Neuromuscular re-education;Patient/family education;Passive range of motion;Energy conservation;Vestibular    PT Next Visit Plan  finish BERG, 6MWT with rollator!!, initiate walking program and HEP for balance and strength, encourage use of RW in community (can also do this in clinic for improved carryover)    Consulted and Agree with Plan of Care  Patient       Patient will benefit from skilled therapeutic intervention in order to improve the following deficits and impairments:  Difficulty walking, Decreased activity tolerance, Decreased balance, Decreased endurance, Decreased knowledge of use of DME, Decreased mobility, Decreased range of motion, Decreased strength, Dizziness, Impaired perceived functional ability, Impaired flexibility, Postural dysfunction, Impaired sensation, Pain(dizziness to be addressed as needed, she reports history of vertigo, pain to be addressed through therex)  Visit Diagnosis: Unsteadiness on feet - Plan: PT plan of care cert/re-cert  Muscle weakness (generalized) - Plan: PT plan of care cert/re-cert  Other abnormalities of gait and mobility - Plan: PT plan of care cert/re-cert  Abnormal posture - Plan: PT plan of care cert/re-cert     Problem List Patient Active Problem List   Diagnosis Date Noted  . UTI (lower urinary tract infection) 02/28/2016  . Restrictive lung disease 11/01/2015  . Osteoporosis 06/22/2015  . Breast cancer, left ([place]) 02/19/2014  . Acute bronchitis 09/09/2012  . Esophageal dysmotility 04/19/2012  . Benign esophageal stricture 04/06/2012  . History of hiatal hernia 03/06/2012  . Dysphagia 03/06/2012  . GERD (gastroesophageal reflux disease) 03/06/2012  . Pneumonia 12/29/2010    Cameron Sprang, PT, MPT [facility] [address] [place], [place], [address] Phone: [phone]   Fax:  [phone] 04/19/18, 8:25  AM  Name: Joan Weaver MRN: [id number] Date of Birth: [birth year]

## 2018-04-24 ENCOUNTER — Encounter: Payer: Self-pay | Admitting: Physical Therapy

## 2018-04-24 ENCOUNTER — Ambulatory Visit: Payer: Medicare Other | Admitting: Physical Therapy

## 2018-04-24 DIAGNOSIS — R2689 Other abnormalities of gait and mobility: Secondary | ICD-10-CM

## 2018-04-24 DIAGNOSIS — R2681 Unsteadiness on feet: Secondary | ICD-10-CM

## 2018-04-24 DIAGNOSIS — M6281 Muscle weakness (generalized): Secondary | ICD-10-CM | POA: Diagnosis not present

## 2018-04-24 DIAGNOSIS — R293 Abnormal posture: Secondary | ICD-10-CM | POA: Diagnosis not present

## 2018-04-24 NOTE — Patient Instructions (Signed)
Access Code: [email or web address]  URL: [email or web address]  Date: 04/24/2018  Prepared by: Willow Ora   Exercises  Sideways Walking - 3 reps - 1 sets - 1x daily - 5x weekly  Tandem Walking with Counter Support - 3 reps - 1 sets - 1x daily - 5x weekly  Standing Single Leg Stance with Counter Support - 3 reps - 1 sets - 1x daily - 5 x weekly

## 2018-04-25 NOTE — Therapy (Signed)
[facility] [address] [place], [place], [address] Phone: [phone]   Fax:  [phone]  Physical Therapy Treatment  Patient Details  Name: Joan Weaver MRN: [id number] Date of Birth: Apr 15, 1941 Referring Provider: Jenna Luo, MD   Encounter Date: 04/24/2018  PT End of Session - 04/24/18 1405    Visit Number  2    Number of Visits  13    Date for PT Re-Evaluation  79/02/40 cert written for 90, plan to see 60 days    Authorization Type  Medicare    PT Start Time  1403    PT Stop Time  1445    PT Time Calculation (min)  42 min    Equipment Utilized During Treatment  Gait belt    Activity Tolerance  Patient tolerated treatment well    Behavior During Therapy  [facility] for tasks assessed/performed       Past Medical History:  Diagnosis Date  . Arthritis    knees, hips  . Breast cancer [facility]) August 2011   left - lumpectomy  . Cataract   . GERD (gastroesophageal reflux disease)    minor - tx with zantac  . H/O bladder infections   . Headache(784.0)    otc med prn  . Heart murmur    slight murmur -never had any problems  . Hernia   . Hypertension    no meds x 3 yrs  . Hypothyroidism   . Osteoporosis   . 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 Thyroid disease   . Ulcer   . Urinary incontinence     Past Surgical History:  Procedure Laterality Date  . APPENDECTOMY    . BREAST LUMPECTOMY     left  . EYE SURGERY     left- cataract   . HIATAL HERNIA REPAIR     x 2  . HYSTEROSCOPY W/D&C N/A 07/07/2013   Procedure: DILATATION AND CURETTAGE /HYSTEROSCOPY;  Surgeon: Anastasio Auerbach, MD;  Location: [facility];  Service: Gynecology;  Laterality: N/A;  . KNEE SURGERY     Arthroscopic  . left cataract surgery    . TONSILLECTOMY      There were no vitals filed for this visit.  Subjective Assessment - 04/24/18 1405    Subjective  No falls or pain to report. Does  report having an upset stomach today, better at this time.     Pertinent History  breast cancer (in remission), osteoporosis    Limitations  House hold activities;Walking;Standing    How long can you walk comfortably?  sometimes from one room to the next     Patient Stated Goals  "to be able to walk normally and stand and take better care of my house."     Currently in Pain?  No/denies    Pain Score  0-No pain         [facility] PT Assessment - 04/24/18 1407      6 Minute Walk- Baseline   6 Minute Walk- Baseline  yes    BP (mmHg)  107/77    HR (bpm)  84    02 Sat (%RA)  98 %    Modified Borg Scale for Dyspnea  0- Nothing at all    Perceived Rate of Exertion (Borg)  6-      6 Minute walk- Post Test   6 Minute Walk Post Test  yes    BP (mmHg)  121/74    HR (bpm)  88    02 Sat (%RA)  97 %    Modified Borg Scale for Dyspnea  3- Moderate shortness of breath or breathing difficulty    Perceived Rate of Exertion (Borg)  12-      6 minute walk test results    Aerobic Endurance Distance Walked  765    Endurance additional comments  with rollator, no rest breaks taken. supervision with no balance issues noted, slow cadence       Berg Balance Test   Sit to Stand  Able to stand without using hands and stabilize independently    Standing Unsupported  Able to stand safely 2 minutes    Sitting with Back Unsupported but Feet Supported on Floor or Stool  Able to sit safely and securely 2 minutes    Stand to Sit  Sits safely with minimal use of hands    Transfers  Able to transfer safely, minor use of hands    Standing Unsupported with Eyes Closed  Able to stand 10 seconds with supervision    Standing Ubsupported with Feet Together  Able to place feet together independently and stand for 1 minute with supervision    From Standing, Reach Forward with Outstretched Arm  Can reach forward >12 cm safely (5") 7 inches    From Standing Position, Pick up Object from Floor  Able to pick up shoe, needs  supervision    From Standing Position, Turn to Look Behind Over each Shoulder  Looks behind one side only/other side shows less weight shift left> right    Turn 360 Degrees  Needs close supervision or verbal cueing >/= 7 sec's both ways    Standing Unsupported, Alternately Place Feet on Step/Stool  Able to stand independently and complete 8 steps >20 seconds    Standing Unsupported, One Foot in Front  Able to plae foot ahead of the other independently and hold 30 seconds    Standing on One Leg  Tries to lift leg/unable to hold 3 seconds but remains standing independently    Total Score  43        Access Code: [email or web address]  URL: [email or web address]  Date: 04/24/2018  Prepared by: Willow Ora   Exercises  Sideways Walking - 3 reps - 1 sets - 1x daily - 5x weekly  Tandem Walking with Counter Support - 3 reps - 1 sets - 1x daily - 5x weekly  Standing Single Leg Stance with Counter Support - 3 reps - 1 sets - 1x daily - 5 x weekly      PT Education - 04/24/18 1453    Education Details  results of Berg Balance test, results of 6 mintue walk test and initial HEP to address balance/strengthening    Person(s) Educated  Patient    Methods  Explanation;Demonstration;Verbal cues;Handout    Comprehension  Verbalized understanding;Returned demonstration;Verbal cues required;Need further instruction       PT Short Term Goals - 04/19/18 0817      PT SHORT TERM GOAL #1   Title  Pt will be independent with initial HEP in order to indicate decreased fall risk and improved functional strength.  (Target Date: 05/18/18)    Time  4    Period  Weeks    Status  New    Target Date  05/18/18      PT SHORT TERM GOAL #2   Title  Pt will complete BERG balance test and improve score 4 points from  baseline in order to indicate decreased fall risk.      Time  4    Period  Weeks    Status  New      PT SHORT TERM GOAL #3   Title  Will assess 6MWT and improve distance by 67' in order to indicate  improved functional endurance.      Time  4    Period  Weeks    Status  New      PT SHORT TERM GOAL #4   Title  Pt will improve gait speed to >/=2.56 ft/sec w/ LRAD in order to indicate improved efficiency of gait and decreased fall risk.     Time  4    Period  Weeks    Status  New      PT SHORT TERM GOAL #5   Title  Pt will ambulate x 300' w/ LRAD at mod I level over paved unlevel outdoor surfaces (including curb) in order to indicate safe community negotiation.     Time  4    Period  Weeks    Status  New        PT Long Term Goals - 04/19/18 0820      PT LONG TERM GOAL #1   Title  Pt will be independent with final HEP in order to indicate decreased fall risk and improved functional strength.  (Target Date: 06/17/18)    Time  8    Period  Weeks    Status  New    Target Date  06/17/18      PT LONG TERM GOAL #2   Title  Pt will improve BERG balance test 8 points from baseline in order to indicate decreased fall risk.     Time  8    Period  Weeks    Status  New      PT LONG TERM GOAL #3   Title  Pt will improve 6MWT distance by 150' from baseline in order to indicate improved functional endurance.     Time  8    Period  Weeks    Status  New      PT LONG TERM GOAL #4   Title  Pt will ambulate with gait speed >/=2.62 ft/sec in order to indicate safe community ambulation.     Time  8    Period  Weeks    Status  New      PT LONG TERM GOAL #5   Title  Pt will report being able to tolerate short shopping trips with use of RW/shopping cart in order to indicate improved community ambulation/endurance.      Time  8    Period  Weeks    Status  New      Additional Long Term Goals   Additional Long Term Goals  Yes      PT LONG TERM GOAL #6   Title  Pt will negotiate up/down 4 steps with single rail at mod I level in reciprocal pattern in order to indicate improved functional strength.     Time  8    Period  Weeks    Status  New            Plan - 04/24/18 1407     Rehab Potential  Good    Clinical Impairments Affecting Rehab Potential  co-morbidities, current state of B knees due to arthritis     PT Frequency  2x / week then 1x/wk for 4 more weeks  PT Duration  4 weeks then 1x/wk for 4 more weeks    PT Treatment/Interventions  ADLs/Self Care Home Management;DME Instruction;Gait training;Stair training;Functional mobility training;Therapeutic activities;Therapeutic exercise;Balance training;Neuromuscular re-education;Patient/family education;Passive range of motion;Energy conservation;Vestibular    PT Next Visit Plan  initiate walking program and HEP for balance and strength, encourage use of RW in community (can also do this in clinic for improved carryover)    Consulted and Agree with Plan of Care  Patient       Patient will benefit from skilled therapeutic intervention in order to improve the following deficits and impairments:  Difficulty walking, Decreased activity tolerance, Decreased balance, Decreased endurance, Decreased knowledge of use of DME, Decreased mobility, Decreased range of motion, Decreased strength, Dizziness, Impaired perceived functional ability, Impaired flexibility, Postural dysfunction, Impaired sensation, Pain(dizziness to be addressed as needed, she reports history of vertigo, pain to be addressed through therex)  Visit Diagnosis: Unsteadiness on feet  Muscle weakness (generalized)  Other abnormalities of gait and mobility     Problem List Patient Active Problem List   Diagnosis Date Noted  . UTI (lower urinary tract infection) 02/28/2016  . Restrictive lung disease 11/01/2015  . Osteoporosis 06/22/2015  . Breast cancer, left ([place]) 02/19/2014  . Acute bronchitis 09/09/2012  . Esophageal dysmotility 04/19/2012  . Benign esophageal stricture 04/06/2012  . History of hiatal hernia 03/06/2012  . Dysphagia 03/06/2012  . GERD (gastroesophageal reflux disease) 03/06/2012  . Pneumonia 12/29/2010    Willow Ora, PTA,  [facility] [address], [place], [place] [address] [phone] 04/25/18, 7:01 PM   Name: Joan Weaver MRN: [id number] Date of Birth: Oct 16, 1940

## 2018-04-26 ENCOUNTER — Ambulatory Visit: Payer: Medicare Other | Admitting: Rehabilitation

## 2018-04-26 ENCOUNTER — Encounter: Payer: Self-pay | Admitting: Rehabilitation

## 2018-04-26 DIAGNOSIS — R2681 Unsteadiness on feet: Secondary | ICD-10-CM | POA: Diagnosis not present

## 2018-04-26 DIAGNOSIS — M6281 Muscle weakness (generalized): Secondary | ICD-10-CM | POA: Diagnosis not present

## 2018-04-26 DIAGNOSIS — R2689 Other abnormalities of gait and mobility: Secondary | ICD-10-CM | POA: Diagnosis not present

## 2018-04-26 DIAGNOSIS — R293 Abnormal posture: Secondary | ICD-10-CM | POA: Diagnosis not present

## 2018-04-26 NOTE — Patient Instructions (Signed)
Access Code: [email or web address]  URL: [email or web address]  Date: 04/26/2018  Prepared by: Cameron Sprang   Exercises  Sideways Walking - 3 reps - 1 sets - 1x daily - 5x weekly  Tandem Walking with Counter Support - 3 reps - 1 sets - 1x daily - 5x weekly  Standing Single Leg Stance with Counter Support - 3 reps - 1 sets - 1x daily - 5 x weekly  Romberg Stance with Head Nods on Foam Pad - 10 reps - 1 sets - 1x daily - 7x weekly  Romberg Stance on Foam Pad with Head Rotation - 10 reps - 1 sets - 1x daily - 7x weekly

## 2018-04-26 NOTE — Therapy (Signed)
[facility] [address] [place], [place], [address] Phone: [phone]   Fax:  [phone]  Physical Therapy Treatment  Patient Details  Name: Joan Weaver MRN: [id number] Date of Birth: 04-19-1941 Referring Provider: Jenna Luo, MD   Encounter Date: 04/26/2018  PT End of Session - 04/26/18 1623    Visit Number  3    Number of Visits  13    Date for PT Re-Evaluation  93/79/02 cert written for 90, plan to see 60 days    Authorization Type  Medicare    PT Start Time  0331    PT Stop Time  0421    PT Time Calculation (min)  50 min    Equipment Utilized During Treatment  Gait belt    Activity Tolerance  Patient tolerated treatment well    Behavior During Therapy  [facility] for tasks assessed/performed       Past Medical History:  Diagnosis Date  . Arthritis    knees, hips  . Breast cancer [facility]) August 2011   left - lumpectomy  . Cataract   . GERD (gastroesophageal reflux disease)    minor - tx with zantac  . H/O bladder infections   . Headache(784.0)    otc med prn  . Heart murmur    slight murmur -never had any problems  . Hernia   . Hypertension    no meds x 3 yrs  . Hypothyroidism   . Osteoporosis   . 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 Thyroid disease   . Ulcer   . Urinary incontinence     Past Surgical History:  Procedure Laterality Date  . APPENDECTOMY    . BREAST LUMPECTOMY     left  . EYE SURGERY     left- cataract   . HIATAL HERNIA REPAIR     x 2  . HYSTEROSCOPY W/D&C N/A 07/07/2013   Procedure: DILATATION AND CURETTAGE /HYSTEROSCOPY;  Surgeon: Anastasio Auerbach, MD;  Location: [facility];  Service: Gynecology;  Laterality: N/A;  . KNEE SURGERY     Arthroscopic  . left cataract surgery    . TONSILLECTOMY      There were no vitals filed for this visit.  Subjective Assessment - 04/26/18 1532    Subjective  No falls. reports pain in both  knees today. no other changes to report. She has been doing her HEP at home.    Pertinent History  breast cancer (in remission), osteoporosis    Limitations  House hold activities;Walking;Standing    How long can you walk comfortably?  sometimes from one room to the next     Patient Stated Goals  "to be able to walk normally and stand and take better care of my house."     Currently in Pain?  Yes    Pain Score  6     Pain Location  Knee    Pain Orientation  Right;Left                       [facility] Adult PT Treatment/Exercise - 04/26/18 1627      Neuro Re-ed    Neuro Re-ed Details   In corner with chair for safety, starting on level surface feet together EO then EC x 30 secs progressing to head turns/ nods x 10 reps. No UE support and S. On 2 pillows feet together EO then EC x  30 secs x 2, progressing to head turns/nods x 10 reps x 2 sets each. Pt needing min guard assist and intermittent UE support to maintain balance. At counter top pt instructed in tandem walking fwd/bwd requiring UE support and min guard assit to maintain balance; toe/heel walking x 2 sets with UE support, pt c/o B knee pain with toe walking. Fwd/bwd marching with UE support x 2 sets , Verbal cues needed to increase stance time and maintain upright posture. To work on SLS pt was instructed on fwd tapping small compliant target then progressing to cone tapping x 10 reps each. Pt appeared to be weaker lifting LLE. Needing min guard assist to prevent LOB.      Knee/Hip Exercises: Standing   Lateral Step Up  Both;1 set;10 reps;Step Height: 4"    Lateral Step Up Limitations  Pt needing intermittent UE support to prevent LOB. Verbal cues on foot palcement and posture.               PT Short Term Goals - 04/19/18 0817      PT SHORT TERM GOAL #1   Title  Pt will be independent with initial HEP in order to indicate decreased fall risk and improved functional strength.  (Target Date: 05/18/18)    Time  4     Period  Weeks    Status  New    Target Date  05/18/18      PT SHORT TERM GOAL #2   Title  Pt will complete BERG balance test and improve score 4 points from baseline in order to indicate decreased fall risk.      Time  4    Period  Weeks    Status  New      PT SHORT TERM GOAL #3   Title  Will assess 6MWT and improve distance by 25' in order to indicate improved functional endurance.      Time  4    Period  Weeks    Status  New      PT SHORT TERM GOAL #4   Title  Pt will improve gait speed to >/=2.56 ft/sec w/ LRAD in order to indicate improved efficiency of gait and decreased fall risk.     Time  4    Period  Weeks    Status  New      PT SHORT TERM GOAL #5   Title  Pt will ambulate x 300' w/ LRAD at mod I level over paved unlevel outdoor surfaces (including curb) in order to indicate safe community negotiation.     Time  4    Period  Weeks    Status  New        PT Long Term Goals - 04/19/18 0820      PT LONG TERM GOAL #1   Title  Pt will be independent with final HEP in order to indicate decreased fall risk and improved functional strength.  (Target Date: 06/17/18)    Time  8    Period  Weeks    Status  New    Target Date  06/17/18      PT LONG TERM GOAL #2   Title  Pt will improve BERG balance test 8 points from baseline in order to indicate decreased fall risk.     Time  8    Period  Weeks    Status  New      PT LONG TERM GOAL #3   Title  Pt will  improve 6MWT distance by 150' from baseline in order to indicate improved functional endurance.     Time  8    Period  Weeks    Status  New      PT LONG TERM GOAL #4   Title  Pt will ambulate with gait speed >/=2.62 ft/sec in order to indicate safe community ambulation.     Time  8    Period  Weeks    Status  New      PT LONG TERM GOAL #5   Title  Pt will report being able to tolerate short shopping trips with use of RW/shopping cart in order to indicate improved community ambulation/endurance.      Time  8     Period  Weeks    Status  New      Additional Long Term Goals   Additional Long Term Goals  Yes      PT LONG TERM GOAL #6   Title  Pt will negotiate up/down 4 steps with single rail at mod I level in reciprocal pattern in order to indicate improved functional strength.     Time  8    Period  Weeks    Status  New            Plan - 04/26/18 1623    Clinical Impression Statement  Today's skilled session focused on balance and LE strengthening. Pt did well this session and balance exercises were added to HEP. Pt would benefit from continued PT in order to progress toward goals and increase functional independence.    Rehab Potential  Good    Clinical Impairments Affecting Rehab Potential  co-morbidities, current state of B knees due to arthritis     PT Frequency  2x / week then 1x/wk for 4 more weeks    PT Duration  4 weeks then 1x/wk for 4 more weeks    PT Treatment/Interventions  ADLs/Self Care Home Management;DME Instruction;Gait training;Stair training;Functional mobility training;Therapeutic activities;Therapeutic exercise;Balance training;Neuromuscular re-education;Patient/family education;Passive range of motion;Energy conservation;Vestibular    PT Next Visit Plan  initiate walking program and keep adding to HEP for balance and strength, encourage use of RW in community (can also do this in clinic for improved carryover). Balance on compliant surface, LE strengthening    Consulted and Agree with Plan of Care  Patient       Patient will benefit from skilled therapeutic intervention in order to improve the following deficits and impairments:  Difficulty walking, Decreased activity tolerance, Decreased balance, Decreased endurance, Decreased knowledge of use of DME, Decreased mobility, Decreased range of motion, Decreased strength, Dizziness, Impaired perceived functional ability, Impaired flexibility, Postural dysfunction, Impaired sensation, Pain(dizziness to be addressed as needed,  she reports history of vertigo, pain to be addressed through therex)  Visit Diagnosis: Unsteadiness on feet  Muscle weakness (generalized)  Other abnormalities of gait and mobility     Problem List Patient Active Problem List   Diagnosis Date Noted  . UTI (lower urinary tract infection) 02/28/2016  . Restrictive lung disease 11/01/2015  . Osteoporosis 06/22/2015  . Breast cancer, left ([place]) 02/19/2014  . Acute bronchitis 09/09/2012  . Esophageal dysmotility 04/19/2012  . Benign esophageal stricture 04/06/2012  . History of hiatal hernia 03/06/2012  . Dysphagia 03/06/2012  . GERD (gastroesophageal reflux disease) 03/06/2012  . Pneumonia 12/29/2010   Access Code: [email or web address]  URL: [email or web address]  Date: 04/26/2018  Prepared by: Cameron Sprang   Exercises  Sideways Walking - 3 reps - 1 sets -  1x daily - 5x weekly  Tandem Walking with Counter Support - 3 reps - 1 sets - 1x daily - 5x weekly  Standing Single Leg Stance with Counter Support - 3 reps - 1 sets - 1x daily - 5 x weekly  Romberg Stance with Head Nods on Foam Pad - 10 reps - 1 sets - 1x daily - 7x weekly  Romberg Stance on Foam Pad with Head Rotation - 10 reps - 1 sets - 1x daily - 7x weekly   Halina Andreas, SPTA 04/26/2018, 4:44 PM  [facility] [address] [place], [place], [address] Phone: [phone]   Fax:  [phone]  Name: CLORINDA WYBLE MRN: [id number] Date of Birth: March 27, 1941

## 2018-05-02 ENCOUNTER — Ambulatory Visit: Payer: Medicare Other | Admitting: Physical Therapy

## 2018-05-02 ENCOUNTER — Encounter: Payer: Self-pay | Admitting: Physical Therapy

## 2018-05-02 DIAGNOSIS — R2681 Unsteadiness on feet: Secondary | ICD-10-CM | POA: Diagnosis not present

## 2018-05-02 DIAGNOSIS — M6281 Muscle weakness (generalized): Secondary | ICD-10-CM | POA: Diagnosis not present

## 2018-05-02 DIAGNOSIS — R2689 Other abnormalities of gait and mobility: Secondary | ICD-10-CM

## 2018-05-02 DIAGNOSIS — R293 Abnormal posture: Secondary | ICD-10-CM

## 2018-05-02 NOTE — Therapy (Signed)
[facility] [address] [place], [place], [address] Phone: [phone]   Fax:  [phone]  Physical Therapy Treatment  Patient Details  Name: Joan Weaver MRN: [id number] Date of Birth: Oct 02, 1941 Referring Provider: Jenna Luo, MD   Encounter Date: 05/02/2018  PT End of Session - 05/02/18 2321    Visit Number  4    Number of Visits  13    Date for PT Re-Evaluation  59/74/71 cert written for 90, plan to see 60 days    Authorization Type  Medicare    PT Start Time  1450    PT Stop Time  1533    PT Time Calculation (min)  43 min    Equipment Utilized During Treatment  Gait belt    Activity Tolerance  Patient tolerated treatment well    Behavior During Therapy  [facility] for tasks assessed/performed       Past Medical History:  Diagnosis Date  . Arthritis    knees, hips  . Breast cancer [facility]) August 2011   left - lumpectomy  . Cataract   . GERD (gastroesophageal reflux disease)    minor - tx with zantac  . H/O bladder infections   . Headache(784.0)    otc med prn  . Heart murmur    slight murmur -never had any problems  . Hernia   . Hypertension    no meds x 3 yrs  . Hypothyroidism   . Osteoporosis   . 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 Thyroid disease   . Ulcer   . Urinary incontinence     Past Surgical History:  Procedure Laterality Date  . APPENDECTOMY    . BREAST LUMPECTOMY     left  . EYE SURGERY     left- cataract   . HIATAL HERNIA REPAIR     x 2  . HYSTEROSCOPY W/D&C N/A 07/07/2013   Procedure: DILATATION AND CURETTAGE /HYSTEROSCOPY;  Surgeon: Anastasio Auerbach, MD;  Location: [facility];  Service: Gynecology;  Laterality: N/A;  . KNEE SURGERY     Arthroscopic  . left cataract surgery    . TONSILLECTOMY      There were no vitals filed for this visit.  Subjective Assessment - 05/02/18 1455    Subjective  Has had bil knee pain since last  session (about a week ago) and today is the best day since then. Just sore today. Did all her ex's except the pillow ones that were new. She was not sure if they were the cause or the step ups. To clinic with RW today (was her husbands). RW is too tall at current heights.     Pertinent History  breast cancer (in remission), osteoporosis    Limitations  House hold activities;Walking;Standing    How long can you walk comfortably?  sometimes from one room to the next     Patient Stated Goals  "to be able to walk normally and stand and take better care of my house."     Currently in Pain?  Yes    Pain Score  5     Pain Location  Knee    Pain Orientation  Right;Left    Pain Descriptors / Indicators  Aching;Shooting    Pain Type  Chronic pain    Pain Onset  More than a month ago    Pain Frequency  Constant    Aggravating Factors  increased activity, increased walking    Pain Relieving Factors  sitting down and resting, cream,           [facility] Adult PT Treatment/Exercise - 05/02/18 1508      Transfers   Transfers  Sit to Stand;Stand to Sit    Sit to Stand  6: Modified independent (Device/Increase time)    Stand to Sit  6: Modified independent (Device/Increase time)      Ambulation/Gait   Ambulation/Gait  Yes    Ambulation/Gait Assistance  5: Supervision    Ambulation/Gait Assistance Details  RW adjusted down to correct size with tennis balls added to back poles. Her RW has the 3 inch wheels, may need to look at switching to 5 inch to assist with outdoor stability with use of this RW. cues needed on posture and RW position. pt taking larger steps with reciprocal pattern with use of RW.     Ambulation Distance (Feet)  450 Feet x1, 500 x1 in/outdoors    Assistive device  Rolling walker    Gait Pattern  Step-through pattern;Decreased stride length    Ambulation Surface  Level;Indoor      High Level Balance   High Level Balance Activities  Side stepping;Marching forwards;Marching  backwards;Tandem walking    High Level Balance Comments  in parallel bars with UE support: 3 laps each with cues on posture and ex form. min guard assist.          PT Short Term Goals - 04/19/18 0817      PT SHORT TERM GOAL #1   Title  Pt will be independent with initial HEP in order to indicate decreased fall risk and improved functional strength.  (Target Date: 05/18/18)    Time  4    Period  Weeks    Status  New    Target Date  05/18/18      PT SHORT TERM GOAL #2   Title  Pt will complete BERG balance test and improve score 4 points from baseline in order to indicate decreased fall risk.      Time  4    Period  Weeks    Status  New      PT SHORT TERM GOAL #3   Title  Will assess 6MWT and improve distance by 64' in order to indicate improved functional endurance.      Time  4    Period  Weeks    Status  New      PT SHORT TERM GOAL #4   Title  Pt will improve gait speed to >/=2.56 ft/sec w/ LRAD in order to indicate improved efficiency of gait and decreased fall risk.     Time  4    Period  Weeks    Status  New      PT SHORT TERM GOAL #5   Title  Pt will ambulate x 300' w/ LRAD at mod I level over paved unlevel outdoor surfaces (including curb) in order to indicate safe community negotiation.     Time  4    Period  Weeks    Status  New        PT Long Term Goals - 04/19/18 0820      PT LONG TERM GOAL #1   Title  Pt will be independent with final HEP in order to indicate decreased fall risk and improved functional strength.  (Target Date: 06/17/18)    Time  8    Period  Weeks  Status  New    Target Date  06/17/18      PT LONG TERM GOAL #2   Title  Pt will improve BERG balance test 8 points from baseline in order to indicate decreased fall risk.     Time  8    Period  Weeks    Status  New      PT LONG TERM GOAL #3   Title  Pt will improve 6MWT distance by 150' from baseline in order to indicate improved functional endurance.     Time  8    Period  Weeks     Status  New      PT LONG TERM GOAL #4   Title  Pt will ambulate with gait speed >/=2.62 ft/sec in order to indicate safe community ambulation.     Time  8    Period  Weeks    Status  New      PT LONG TERM GOAL #5   Title  Pt will report being able to tolerate short shopping trips with use of RW/shopping cart in order to indicate improved community ambulation/endurance.      Time  8    Period  Weeks    Status  New      Additional Long Term Goals   Additional Long Term Goals  Yes      PT LONG TERM GOAL #6   Title  Pt will negotiate up/down 4 steps with single rail at mod I level in reciprocal pattern in order to indicate improved functional strength.     Time  8    Period  Weeks    Status  New            Plan - 05/01/18 2330    Clinical Impression Statement  Today's silled session continued to focus on gait with RW on various surfaces and high level balance activities. Pt. is progressing toward goals and should benefit from continued PT to progress toward unmet goals.     Rehab Potential  Good    Clinical Impairments Affecting Rehab Potential  co-morbidities, current state of B knees due to arthritis     PT Frequency  2x / week then 1x/wk for 4 more weeks    PT Duration  4 weeks then 1x/wk for 4 more weeks    PT Treatment/Interventions  ADLs/Self Care Home Management;DME Instruction;Gait training;Stair training;Functional mobility training;Therapeutic activities;Therapeutic exercise;Balance training;Neuromuscular re-education;Patient/family education;Passive range of motion;Energy conservation;Vestibular    PT Next Visit Plan  initiate walking program and keep adding to HEP for balance and strength, encourage use of RW in community (can also do this in clinic for improved carryover). Balance on compliant surface, LE strengthening    Consulted and Agree with Plan of Care  Patient       Patient will benefit from skilled therapeutic intervention in order to improve the following  deficits and impairments:  Difficulty walking, Decreased activity tolerance, Decreased balance, Decreased endurance, Decreased knowledge of use of DME, Decreased mobility, Decreased range of motion, Decreased strength, Dizziness, Impaired perceived functional ability, Impaired flexibility, Postural dysfunction, Impaired sensation, Pain(dizziness to be addressed as needed, she reports history of vertigo, pain to be addressed through therex)  Visit Diagnosis: Unsteadiness on feet  Muscle weakness (generalized)  Other abnormalities of gait and mobility  Abnormal posture     Problem List Patient Active Problem List   Diagnosis Date Noted  . UTI (lower urinary tract infection) 02/28/2016  . Restrictive lung disease 11/01/2015  .  Osteoporosis 06/22/2015  . Breast cancer, left ([place]) 02/19/2014  . Acute bronchitis 09/09/2012  . Esophageal dysmotility 04/19/2012  . Benign esophageal stricture 04/06/2012  . History of hiatal hernia 03/06/2012  . Dysphagia 03/06/2012  . GERD (gastroesophageal reflux disease) 03/06/2012  . Pneumonia 12/29/2010    Willow Ora, PTA, [facility] [address] [place], [place] [address] [phone] 05/02/18, 11:37 PM   Name: ZARIAH CAVENDISH MRN: [id number] Date of Birth: March 19, 1941

## 2018-05-03 ENCOUNTER — Ambulatory Visit: Payer: Medicare Other | Admitting: Rehabilitation

## 2018-05-07 ENCOUNTER — Encounter: Payer: Self-pay | Admitting: Physical Therapy

## 2018-05-07 ENCOUNTER — Ambulatory Visit: Payer: Medicare Other | Admitting: Physical Therapy

## 2018-05-07 DIAGNOSIS — M6281 Muscle weakness (generalized): Secondary | ICD-10-CM

## 2018-05-07 DIAGNOSIS — R2681 Unsteadiness on feet: Secondary | ICD-10-CM

## 2018-05-07 DIAGNOSIS — R293 Abnormal posture: Secondary | ICD-10-CM

## 2018-05-07 DIAGNOSIS — R2689 Other abnormalities of gait and mobility: Secondary | ICD-10-CM | POA: Diagnosis not present

## 2018-05-08 ENCOUNTER — Other Ambulatory Visit: Payer: Self-pay | Admitting: Family Medicine

## 2018-05-08 DIAGNOSIS — I10 Essential (primary) hypertension: Secondary | ICD-10-CM

## 2018-05-08 NOTE — Therapy (Signed)
[facility] [address] [place], [place], [address] Phone: [phone]   Fax:  [phone]  Physical Therapy Treatment  Patient Details  Name: Joan Weaver MRN: [id number] Date of Birth: Nov 14, 1940 Referring Provider: Jenna Luo, MD   Encounter Date: 05/07/2018  PT End of Session - 05/07/18 1450    Visit Number  5    Number of Visits  13    Date for PT Re-Evaluation  94/17/40 cert written for 90, plan to see 60 days    Authorization Type  Medicare    PT Start Time  1450    PT Stop Time  1530    PT Time Calculation (min)  40 min    Equipment Utilized During Treatment  Gait belt    Activity Tolerance  Patient tolerated treatment well    Behavior During Therapy  [facility] for tasks assessed/performed       Past Medical History:  Diagnosis Date  . Arthritis    knees, hips  . Breast cancer [facility]) August 2011   left - lumpectomy  . Cataract   . GERD (gastroesophageal reflux disease)    minor - tx with zantac  . H/O bladder infections   . Headache(784.0)    otc med prn  . Heart murmur    slight murmur -never had any problems  . Hernia   . Hypertension    no meds x 3 yrs  . Hypothyroidism   . Osteoporosis   . 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 Thyroid disease   . Ulcer   . Urinary incontinence     Past Surgical History:  Procedure Laterality Date  . APPENDECTOMY    . BREAST LUMPECTOMY     left  . EYE SURGERY     left- cataract   . HIATAL HERNIA REPAIR     x 2  . HYSTEROSCOPY W/D&C N/A 07/07/2013   Procedure: DILATATION AND CURETTAGE /HYSTEROSCOPY;  Surgeon: Anastasio Auerbach, MD;  Location: [facility];  Service: Gynecology;  Laterality: N/A;  . KNEE SURGERY     Arthroscopic  . left cataract surgery    . TONSILLECTOMY      There were no vitals filed for this visit.  Subjective Assessment - 05/07/18 1450    Subjective  No new complaints. No new falls.  Does feel the walker is helping her pain some.    Pertinent History  breast cancer (in remission), osteoporosis    Limitations  House hold activities;Walking;Standing    How long can you walk comfortably?  sometimes from one room to the next     Patient Stated Goals  "to be able to walk normally and stand and take better care of my house."     Currently in Pain?  Yes    Pain Score  3     Pain Location  Knee    Pain Orientation  Right;Left    Pain Descriptors / Indicators  Aching;Pressure;Sore    Pain Type  Chronic pain    Pain Onset  More than a month ago    Pain Frequency  Constant varies in intensity    Aggravating Factors   increased activity, increased walking    Pain Relieving Factors  sitting down and resting, creame           [facility] Adult PT Treatment/Exercise - 05/07/18 1455      High Level Balance   High Level  Balance Activities  Side stepping;Marching forwards;Marching backwards;Tandem walking tandem gait fwd/bwd    High Level Balance Comments  on both red mats next to counter top with light UE support: 3 laps each with min guard assist, cues on posture and weight shifting.       Knee/Hip Exercises: Aerobic   Nustep  level 3 with UE/LE's for 8 mintues with goal >/= 55 steps per minute for strengthening and activity tolerance.           Balance Exercises - 05/07/18 1505      Balance Exercises: Standing   Standing Eyes Closed  Wide (BOA);Head turns;Foam/compliant surface;Other reps (comment);30 secs;Limitations    Balance Beam  standing across blue foam beam: alternating fwd stepping to floor/back onto beam, then alternating bwd stepping to floor/back onto beam. 10 reps each side/each way with occasional touch to bars for balance, min guard to min assist for balance.       Balance Exercises: Standing   Standing Eyes Closed Limitations  on airex in corner with chair in front for safety with feet hip width apart: EC no head movments, progressing to EC head movements  left<>right, then up<>down. min guard to min assit for balance. cues on posture and weight shifting for balance.           PT Short Term Goals - 05/07/18 1454      PT SHORT TERM GOAL #1   Title  Pt will be independent with initial HEP in order to indicate decreased fall risk and improved functional strength.  (Target Date: 05/18/18)    Time  4    Period  Weeks    Status  New      PT SHORT TERM GOAL #2   Title  Pt will complete BERG balance test and improve score 4 points from baseline in order to indicate decreased fall risk.      Baseline  04/24/18: baseline score of 43/56 this date    Time  4    Period  Weeks    Status  New      PT SHORT TERM GOAL #3   Title  Will assess 6MWT and improve distance by 27' in order to indicate improved functional endurance.      Baseline  04/24/18: baseline distance of 765 feet with rollator, no rest breaks taken with supervision assist    Time  4    Period  Weeks    Status  New      PT SHORT TERM GOAL #4   Title  Pt will improve gait speed to >/=2.56 ft/sec w/ LRAD in order to indicate improved efficiency of gait and decreased fall risk.     Time  4    Period  Weeks    Status  New      PT SHORT TERM GOAL #5   Title  Pt will ambulate x 300' w/ LRAD at mod I level over paved unlevel outdoor surfaces (including curb) in order to indicate safe community negotiation.     Time  4    Period  Weeks    Status  New        PT Long Term Goals - 04/19/18 0820      PT LONG TERM GOAL #1   Title  Pt will be independent with final HEP in order to indicate decreased fall risk and improved functional strength.  (Target Date: 06/17/18)    Time  8    Period  Weeks  Status  New    Target Date  06/17/18      PT LONG TERM GOAL #2   Title  Pt will improve BERG balance test 8 points from baseline in order to indicate decreased fall risk.     Time  8    Period  Weeks    Status  New      PT LONG TERM GOAL #3   Title  Pt will improve 6MWT distance by 150'  from baseline in order to indicate improved functional endurance.     Time  8    Period  Weeks    Status  New      PT LONG TERM GOAL #4   Title  Pt will ambulate with gait speed >/=2.62 ft/sec in order to indicate safe community ambulation.     Time  8    Period  Weeks    Status  New      PT LONG TERM GOAL #5   Title  Pt will report being able to tolerate short shopping trips with use of RW/shopping cart in order to indicate improved community ambulation/endurance.      Time  8    Period  Weeks    Status  New      Additional Long Term Goals   Additional Long Term Goals  Yes      PT LONG TERM GOAL #6   Title  Pt will negotiate up/down 4 steps with single rail at mod I level in reciprocal pattern in order to indicate improved functional strength.     Time  8    Period  Weeks    Status  New            Plan - 05/07/18 1450    Clinical Impression Statement  Today's skilled session continued to focus on balance and strengthening with no issues noted. Pt continues to need cues for safety with walker, however this is improving and she reports overall less pain with using it. Pt is progressing toward goals and should benefit from continued PT to progress toward unmet goals.     Rehab Potential  Good    Clinical Impairments Affecting Rehab Potential  co-morbidities, current state of B knees due to arthritis     PT Frequency  2x / week then 1x/wk for 4 more weeks    PT Duration  4 weeks then 1x/wk for 4 more weeks    PT Treatment/Interventions  ADLs/Self Care Home Management;DME Instruction;Gait training;Stair training;Functional mobility training;Therapeutic activities;Therapeutic exercise;Balance training;Neuromuscular re-education;Patient/family education;Passive range of motion;Energy conservation;Vestibular    PT Next Visit Plan  continue to address safety with RW; continue to work on LE strengthening, balance and activity tolerance.     Consulted and Agree with Plan of Care   Patient       Patient will benefit from skilled therapeutic intervention in order to improve the following deficits and impairments:  Difficulty walking, Decreased activity tolerance, Decreased balance, Decreased endurance, Decreased knowledge of use of DME, Decreased mobility, Decreased range of motion, Decreased strength, Dizziness, Impaired perceived functional ability, Impaired flexibility, Postural dysfunction, Impaired sensation, Pain(dizziness to be addressed as needed, she reports history of vertigo, pain to be addressed through therex)  Visit Diagnosis: Unsteadiness on feet  Muscle weakness (generalized)  Other abnormalities of gait and mobility  Abnormal posture     Problem List Patient Active Problem List   Diagnosis Date Noted  . UTI (lower urinary tract infection) 02/28/2016  . Restrictive lung  disease 11/01/2015  . Osteoporosis 06/22/2015  . Breast cancer, left ([place]) 02/19/2014  . Acute bronchitis 09/09/2012  . Esophageal dysmotility 04/19/2012  . Benign esophageal stricture 04/06/2012  . History of hiatal hernia 03/06/2012  . Dysphagia 03/06/2012  . GERD (gastroesophageal reflux disease) 03/06/2012  . Pneumonia 12/29/2010    Willow Ora, PTA, [facility] [address], [place], [place] [address] [phone] 05/08/18, 2:39 PM   Name: CHIZARA MENA MRN: [id number] Date of Birth: 18-Apr-1941

## 2018-05-10 ENCOUNTER — Encounter: Payer: Self-pay | Admitting: Physical Therapy

## 2018-05-10 ENCOUNTER — Ambulatory Visit: Payer: Medicare Other | Attending: Family Medicine | Admitting: Physical Therapy

## 2018-05-10 VITALS — BP 110/73 | HR 83

## 2018-05-10 DIAGNOSIS — R293 Abnormal posture: Secondary | ICD-10-CM | POA: Insufficient documentation

## 2018-05-10 DIAGNOSIS — R2689 Other abnormalities of gait and mobility: Secondary | ICD-10-CM | POA: Diagnosis not present

## 2018-05-10 DIAGNOSIS — R2681 Unsteadiness on feet: Secondary | ICD-10-CM | POA: Diagnosis not present

## 2018-05-10 DIAGNOSIS — M6281 Muscle weakness (generalized): Secondary | ICD-10-CM | POA: Diagnosis not present

## 2018-05-10 NOTE — Therapy (Signed)
[facility] [address] [place], [place], [address] Phone: [phone]   Fax:  [phone]  Physical Therapy Treatment  Patient Details  Name: Joan Weaver MRN: [id number] Date of Birth: 06/14/1941 Referring Provider: Jenna Luo, MD   Encounter Date: 05/10/2018  PT End of Session - 05/10/18 1457    Visit Number  6    Number of Visits  13    Date for PT Re-Evaluation  90/24/09 cert written for 90, plan to see 60 days    Authorization Type  Medicare    PT Start Time  1450    PT Stop Time  1536 -5 minutes for bathroom break    PT Time Calculation (min)  46 min    Equipment Utilized During Treatment  Gait belt    Activity Tolerance  Patient tolerated treatment well    Behavior During Therapy  [facility] for tasks assessed/performed       Past Medical History:  Diagnosis Date  . Arthritis    knees, hips  . Breast cancer [facility]) August 2011   left - lumpectomy  . Cataract   . GERD (gastroesophageal reflux disease)    minor - tx with zantac  . H/O bladder infections   . Headache(784.0)    otc med prn  . Heart murmur    slight murmur -never had any problems  . Hernia   . Hypertension    no meds x 3 yrs  . Hypothyroidism   . Osteoporosis   . 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 Thyroid disease   . Ulcer   . Urinary incontinence     Past Surgical History:  Procedure Laterality Date  . APPENDECTOMY    . BREAST LUMPECTOMY     left  . EYE SURGERY     left- cataract   . HIATAL HERNIA REPAIR     x 2  . HYSTEROSCOPY W/D&C N/A 07/07/2013   Procedure: DILATATION AND CURETTAGE /HYSTEROSCOPY;  Surgeon: Anastasio Auerbach, MD;  Location: [facility];  Service: Gynecology;  Laterality: N/A;  . KNEE SURGERY     Arthroscopic  . left cataract surgery    . TONSILLECTOMY      Vitals:   05/10/18 1453  BP: 110/73  Pulse: 83  SpO2: 96%    Subjective Assessment - 05/10/18 1454     Subjective  Reports feeling "run down" today.  Feels it's due to irritiable bowel syndrome. No falls to report. HEP is going well, has not been consistent due to feeling bad.    Pertinent History  breast cancer (in remission), osteoporosis    Limitations  House hold activities;Walking;Standing    How long can you walk comfortably?  sometimes from one room to the next     Patient Stated Goals  "to be able to walk normally and stand and take better care of my house."     Currently in Pain?  Yes    Pain Score  3  8/10 at worst last night    Pain Location  Knee    Pain Orientation  Right    Pain Descriptors / Indicators  Aching;Sore    Pain Type  Chronic pain    Pain Onset  More than a month ago    Pain Frequency  Constant varies in intensity    Aggravating Factors   increased activity, increased walking    Pain Relieving Factors  sitting down  and resting, creame            [facility] Adult PT Treatment/Exercise - 05/10/18 1458      Transfers   Transfers  Sit to Stand;Stand to Sit    Sit to Stand  6: Modified independent (Device/Increase time)    Stand to Sit  6: Modified independent (Device/Increase time)      Ambulation/Gait   Ambulation/Gait  Yes    Ambulation/Gait Assistance  5: Supervision    Ambulation/Gait Assistance Details  vc on posture x1, pt also increased bil step length with cues as well.     Ambulation Distance (Feet)  460 Feet x1, plus around gym with activity    Assistive device  Rolling walker    Gait Pattern  Step-through pattern;Decreased stride length    Ambulation Surface  Level;Indoor      Knee/Hip Exercises: Aerobic   Nustep  level 3 with UE/LE's for 8 mintues with goal >/= 55 steps per minute for strengthening and activity tolerance.           Balance Exercises - 05/10/18 1507      Balance Exercises: Standing   Rockerboard  Anterior/posterior;Lateral;Head turns;EO;EC;30 seconds;10 reps      Balance Exercises: Standing   Rebounder Limitations   performed both ways on balance board with no UE support: rocking the board with emphasis on tall posture, then holding the board steady- EC no head movements, progressing to EC head movements left<>right, up<>down. min guard to min assist for balance with cues on stance position on board, weight shifting and posture to assist with balance.            PT Short Term Goals - 05/07/18 1454      PT SHORT TERM GOAL #1   Title  Pt will be independent with initial HEP in order to indicate decreased fall risk and improved functional strength.  (Target Date: 05/18/18)    Time  4    Period  Weeks    Status  New      PT SHORT TERM GOAL #2   Title  Pt will complete BERG balance test and improve score 4 points from baseline in order to indicate decreased fall risk.      Baseline  04/24/18: baseline score of 43/56 this date    Time  4    Period  Weeks    Status  New      PT SHORT TERM GOAL #3   Title  Will assess 6MWT and improve distance by 70' in order to indicate improved functional endurance.      Baseline  04/24/18: baseline distance of 765 feet with rollator, no rest breaks taken with supervision assist    Time  4    Period  Weeks    Status  New      PT SHORT TERM GOAL #4   Title  Pt will improve gait speed to >/=2.56 ft/sec w/ LRAD in order to indicate improved efficiency of gait and decreased fall risk.     Time  4    Period  Weeks    Status  New      PT SHORT TERM GOAL #5   Title  Pt will ambulate x 300' w/ LRAD at mod I level over paved unlevel outdoor surfaces (including curb) in order to indicate safe community negotiation.     Time  4    Period  Weeks    Status  New  PT Long Term Goals - 04/19/18 0820      PT LONG TERM GOAL #1   Title  Pt will be independent with final HEP in order to indicate decreased fall risk and improved functional strength.  (Target Date: 06/17/18)    Time  8    Period  Weeks    Status  New    Target Date  06/17/18      PT LONG TERM GOAL #2    Title  Pt will improve BERG balance test 8 points from baseline in order to indicate decreased fall risk.     Time  8    Period  Weeks    Status  New      PT LONG TERM GOAL #3   Title  Pt will improve 6MWT distance by 150' from baseline in order to indicate improved functional endurance.     Time  8    Period  Weeks    Status  New      PT LONG TERM GOAL #4   Title  Pt will ambulate with gait speed >/=2.62 ft/sec in order to indicate safe community ambulation.     Time  8    Period  Weeks    Status  New      PT LONG TERM GOAL #5   Title  Pt will report being able to tolerate short shopping trips with use of RW/shopping cart in order to indicate improved community ambulation/endurance.      Time  8    Period  Weeks    Status  New      Additional Long Term Goals   Additional Long Term Goals  Yes      PT LONG TERM GOAL #6   Title  Pt will negotiate up/down 4 steps with single rail at mod I level in reciprocal pattern in order to indicate improved functional strength.     Time  8    Period  Weeks    Status  New           Plan - 05/10/18 1457    Clinical Impression Statement  Today's skilled session contined to focus on activity tolerance, strengthening and balance. Pt is making steady progress toward goals and should benefit from continued PT to progress toward unmet goals.     Rehab Potential  Good    Clinical Impairments Affecting Rehab Potential  co-morbidities, current state of B knees due to arthritis     PT Frequency  2x / week then 1x/wk for 4 more weeks    PT Duration  4 weeks then 1x/wk for 4 more weeks    PT Treatment/Interventions  ADLs/Self Care Home Management;DME Instruction;Gait training;Stair training;Functional mobility training;Therapeutic activities;Therapeutic exercise;Balance training;Neuromuscular re-education;Patient/family education;Passive range of motion;Energy conservation;Vestibular    PT Next Visit Plan  STGs due by 05/18/18- check them next  week; continue to address safety with RW, especially with sit/stand transfers as pt tends to hold the walker; continue to work on LE strengthening, balance and activity tolerance.     Consulted and Agree with Plan of Care  Patient       Patient will benefit from skilled therapeutic intervention in order to improve the following deficits and impairments:  Difficulty walking, Decreased activity tolerance, Decreased balance, Decreased endurance, Decreased knowledge of use of DME, Decreased mobility, Decreased range of motion, Decreased strength, Dizziness, Impaired perceived functional ability, Impaired flexibility, Postural dysfunction, Impaired sensation, Pain(dizziness to be addressed as needed, she reports history of  vertigo, pain to be addressed through therex)  Visit Diagnosis: Unsteadiness on feet  Muscle weakness (generalized)  Other abnormalities of gait and mobility  Abnormal posture     Problem List Patient Active Problem List   Diagnosis Date Noted  . UTI (lower urinary tract infection) 02/28/2016  . Restrictive lung disease 11/01/2015  . Osteoporosis 06/22/2015  . Breast cancer, left ([place]) 02/19/2014  . Acute bronchitis 09/09/2012  . Esophageal dysmotility 04/19/2012  . Benign esophageal stricture 04/06/2012  . History of hiatal hernia 03/06/2012  . Dysphagia 03/06/2012  . GERD (gastroesophageal reflux disease) 03/06/2012  . Pneumonia 12/29/2010    Willow Ora, PTA, [facility] [address] [place], [place] [address] [phone] 05/10/18, 4:38 PM   Name: Joan Weaver MRN: [id number] Date of Birth: Nov 27, 1940

## 2018-05-14 ENCOUNTER — Ambulatory Visit: Payer: Medicare Other | Admitting: Rehabilitation

## 2018-05-14 ENCOUNTER — Encounter: Payer: Self-pay | Admitting: Rehabilitation

## 2018-05-14 DIAGNOSIS — M6281 Muscle weakness (generalized): Secondary | ICD-10-CM | POA: Diagnosis not present

## 2018-05-14 DIAGNOSIS — R293 Abnormal posture: Secondary | ICD-10-CM | POA: Diagnosis not present

## 2018-05-14 DIAGNOSIS — R2689 Other abnormalities of gait and mobility: Secondary | ICD-10-CM | POA: Diagnosis not present

## 2018-05-14 DIAGNOSIS — R2681 Unsteadiness on feet: Secondary | ICD-10-CM | POA: Diagnosis not present

## 2018-05-14 NOTE — Therapy (Signed)
[facility] [address] [place], [place], [address] Phone: [phone]   Fax:  [phone]  Physical Therapy Treatment  Patient Details  Name: Joan Weaver MRN: [id number] Date of Birth: [birth year] Referring Provider: Jenna Luo, MD   Encounter Date: 05/14/2018  PT End of Session - 05/14/18 1509    Visit Number  7    Number of Visits  13    Date for PT Re-Evaluation  41/74/08 cert written for 90, plan to see 60 days    Authorization Type  Medicare    PT Start Time  1401    PT Stop Time  1447    PT Time Calculation (min)  46 min    Equipment Utilized During Treatment  Gait belt    Activity Tolerance  Patient tolerated treatment well    Behavior During Therapy  [facility] for tasks assessed/performed       Past Medical History:  Diagnosis Date  . Arthritis    knees, hips  . Breast cancer [facility]) August 2011   left - lumpectomy  . Cataract   . GERD (gastroesophageal reflux disease)    minor - tx with zantac  . H/O bladder infections   . Headache(784.0)    otc med prn  . Heart murmur    slight murmur -never had any problems  . Hernia   . Hypertension    no meds x 3 yrs  . Hypothyroidism   . Osteoporosis   . 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 Thyroid disease   . Ulcer   . Urinary incontinence     Past Surgical History:  Procedure Laterality Date  . APPENDECTOMY    . BREAST LUMPECTOMY     left  . EYE SURGERY     left- cataract   . HIATAL HERNIA REPAIR     x 2  . HYSTEROSCOPY W/D&C N/A 07/07/2013   Procedure: DILATATION AND CURETTAGE /HYSTEROSCOPY;  Surgeon: Anastasio Auerbach, MD;  Location: [facility];  Service: Gynecology;  Laterality: N/A;  . KNEE SURGERY     Arthroscopic  . left cataract surgery    . TONSILLECTOMY      There were no vitals filed for this visit.  Subjective Assessment - 05/14/18 1406    Subjective  Feeling better but still not back to  normal.  Did walk in grocery store with shopping cart.      Pertinent History  breast cancer (in remission), osteoporosis    Limitations  House hold activities;Walking;Standing    How long can you walk comfortably?  sometimes from one room to the next     Patient Stated Goals  "to be able to walk normally and stand and take better care of my house."     Currently in Pain?  Yes    Pain Score  2     Pain Location  Knee    Pain Orientation  Right    Pain Descriptors / Indicators  Aching;Sore    Pain Type  Chronic pain    Pain Onset  More than a month ago    Pain Frequency  Constant    Aggravating Factors   walking    Pain Relieving Factors  sitting, rest.                        [facility] Adult PT Treatment/Exercise - 05/14/18 1408  Ambulation/Gait   Ambulation/Gait  Yes    Ambulation/Gait Assistance  6: Modified independent (Device/Increase time)    Ambulation/Gait Assistance Details  Pt able to ambulate outdoors over unlevel paved surfaces with RW at mod I level and good gait speed.      Ambulation Distance (Feet)  400 Feet    Assistive device  Rolling walker    Gait Pattern  Step-through pattern;Decreased stride length    Ambulation Surface  Level;Indoor    Gait velocity  2.64 ft/sec with RW      Standardized Balance Assessment   Standardized Balance Assessment  Berg Balance Test      Berg Balance Test   Sit to Stand  Able to stand without using hands and stabilize independently    Standing Unsupported  Able to stand safely 2 minutes    Sitting with Back Unsupported but Feet Supported on Floor or Stool  Able to sit safely and securely 2 minutes    Stand to Sit  Sits safely with minimal use of hands    Transfers  Able to transfer safely, minor use of hands    Standing Unsupported with Eyes Closed  Able to stand 10 seconds safely    Standing Ubsupported with Feet Together  Able to place feet together independently and stand 1 minute safely    From Standing, Reach  Forward with Outstretched Arm  Can reach confidently >25 cm (10")    From Standing Position, Pick up Object from Floor  Able to pick up shoe safely and easily    From Standing Position, Turn to Look Behind Over each Shoulder  Looks behind from both sides and weight shifts well    Turn 360 Degrees  Able to turn 360 degrees safely but slowly    Standing Unsupported, Alternately Place Feet on Step/Stool  Able to stand independently and safely and complete 8 steps in 20 seconds    Standing Unsupported, One Foot in Front  Able to plae foot ahead of the other independently and hold 30 seconds    Standing on One Leg  Tries to lift leg/unable to hold 3 seconds but remains standing independently    Total Score  50      Self-Care   Self-Care  Other Self-Care Comments    Other Self-Care Comments   Discussed progress based on goals.  Also discussed pts fear of falling and her having saw her mother fall ultimately leading to her death at age 13.  PT would like for pt to feel more confident in her balance with RW and feel that she can do more in community.  Pt verbalized understanding.       Neuro Re-ed    Neuro Re-ed Details   Briefly reviewed corner balance tasks standing on pillow today with feet apart, cues for "soft knees" maintaining balance with EC x 15 secs>feet together x 15 secs with EC however note marked instability, therefore had her slightly seperate feet with EC x another 15 secs and this demo'd appropriate amount of challenge, therefore provided cues for her to try at home and report back on Friday. Pt verbalized understanding.         Curb step:  Performed x 2 reps with use of RW at mod I level       PT Education - 05/14/18 1508    Education Details  see self care    Person(s) Educated  Patient    Methods  Explanation    Comprehension  Verbalized understanding  PT Short Term Goals - 05/14/18 1408      PT SHORT TERM GOAL #1   Title  Pt will be independent with initial HEP in  order to indicate decreased fall risk and improved functional strength.  (Target Date: 05/18/18)    Time  4    Period  Weeks    Status  New      PT SHORT TERM GOAL #2   Title  Pt will complete BERG balance test and improve score 4 points from baseline in order to indicate decreased fall risk.      Baseline  04/24/18: baseline score of 43/56 this date, 50/56 on 05/14/18    Time  4    Period  Weeks    Status  Achieved      PT SHORT TERM GOAL #3   Title  Will assess 6MWT and improve distance by 40' in order to indicate improved functional endurance.      Baseline  04/24/18: baseline distance of 765 feet with rollator, no rest breaks taken with supervision assist    Time  4    Period  Weeks    Status  New      PT SHORT TERM GOAL #4   Title  Pt will improve gait speed to >/=2.56 ft/sec w/ LRAD in order to indicate improved efficiency of gait and decreased fall risk.     Baseline  2.64 ft/sec with RW    Time  4    Period  Weeks    Status  Achieved      PT SHORT TERM GOAL #5   Title  Pt will ambulate x 300' w/ LRAD at mod I level over paved unlevel outdoor surfaces (including curb) in order to indicate safe community negotiation.     Baseline  met 05/14/18    Time  4    Period  Weeks    Status  Achieved        PT Long Term Goals - 05/14/18 1512      PT LONG TERM GOAL #1   Title  Pt will be independent with final HEP in order to indicate decreased fall risk and improved functional strength.  (Target Date: 06/17/18)    Time  8    Period  Weeks    Status  New      PT LONG TERM GOAL #2   Title  Pt will improve BERG balance test 8 points from baseline in order to indicate decreased fall risk.     Time  8    Period  Weeks    Status  New      PT LONG TERM GOAL #3   Title  Pt will improve 6MWT distance by 150' from baseline in order to indicate improved functional endurance.     Time  8    Period  Weeks    Status  New      PT LONG TERM GOAL #4   Title  Pt will ambulate with gait  speed >/=2.62 ft/sec in order to indicate safe community ambulation.     Time  8    Period  Weeks    Status  New      PT LONG TERM GOAL #5   Title  Pt will report being able to tolerate short shopping trips with use of RW/shopping cart in order to indicate improved community ambulation/endurance.      Baseline  met 05/14/18    Time  8    Period  Weeks    Status  Achieved      PT LONG TERM GOAL #6   Title  Pt will negotiate up/down 4 steps with single rail at mod I level in reciprocal pattern in order to indicate improved functional strength.     Time  8    Period  Weeks    Status  New            Plan - 05/14/18 1437    Clinical Impression Statement  Skilled session began to look at Joan Weaver.  Note that pt has met 2/5 STG assessed today (BERG and outdoor gait with RW).  She also met LTG for doing short shopping trip this weekend.  Did not assess 6MWT today due to pt not feeling well from stomach bug, will assess at next visit.  Spent a little time briefly going over HEP corner balance tasks.  Pt reports she is no longer doing exercises on pillows due to increased knee pain.  Upon further questioning, feel that pt is locking knees into extension causing more pain.  Reviewed during session and pt able to do without increase in pain. Therefore recommend her try at home and report back to PT on Friday.  Pt verbalized understanding.     Rehab Potential  Good    Clinical Impairments Affecting Rehab Potential  co-morbidities, current state of B knees due to arthritis     PT Frequency  2x / week then 1x/wk for 4 more weeks    PT Duration  4 weeks then 1x/wk for 4 more weeks    PT Treatment/Interventions  ADLs/Self Care Home Management;DME Instruction;Gait training;Stair training;Functional mobility training;Therapeutic activities;Therapeutic exercise;Balance training;Neuromuscular re-education;Patient/family education;Passive range of motion;Energy conservation;Vestibular    PT Next Visit Plan   check remaining STGs due by 05/18/18- check them next week; Did exercises get better on pillow? continue to address safety with RW, especially with sit/stand transfers as pt tends to hold the walker; continue to work on LE strengthening, balance and activity tolerance.     Consulted and Agree with Plan of Care  Patient       Patient will benefit from skilled therapeutic intervention in order to improve the following deficits and impairments:  Difficulty walking, Decreased activity tolerance, Decreased balance, Decreased endurance, Decreased knowledge of use of DME, Decreased mobility, Decreased range of motion, Decreased strength, Dizziness, Impaired perceived functional ability, Impaired flexibility, Postural dysfunction, Impaired sensation, Pain(dizziness to be addressed as needed, she reports history of vertigo, pain to be addressed through therex)  Visit Diagnosis: Unsteadiness on feet  Muscle weakness (generalized)  Other abnormalities of gait and mobility  Abnormal posture     Problem List Patient Active Problem List   Diagnosis Date Noted  . UTI (lower urinary tract infection) 02/28/2016  . Restrictive lung disease 11/01/2015  . Osteoporosis 06/22/2015  . Breast cancer, left ([place]) 02/19/2014  . Acute bronchitis 09/09/2012  . Esophageal dysmotility 04/19/2012  . Benign esophageal stricture 04/06/2012  . History of hiatal hernia 03/06/2012  . Dysphagia 03/06/2012  . GERD (gastroesophageal reflux disease) 03/06/2012  . Pneumonia 12/29/2010    Cameron Sprang, PT, MPT [facility] [address] [place], [place], [address] Phone: [phone]   Fax:  [phone] 05/14/18, 3:13 PM  Name: Joan Weaver MRN: [id number] Date of Birth: 10-Apr-1941

## 2018-05-17 ENCOUNTER — Ambulatory Visit: Payer: Medicare Other | Admitting: Rehabilitation

## 2018-05-17 ENCOUNTER — Encounter: Payer: Self-pay | Admitting: Rehabilitation

## 2018-05-17 DIAGNOSIS — R2681 Unsteadiness on feet: Secondary | ICD-10-CM | POA: Diagnosis not present

## 2018-05-17 DIAGNOSIS — R293 Abnormal posture: Secondary | ICD-10-CM

## 2018-05-17 DIAGNOSIS — R2689 Other abnormalities of gait and mobility: Secondary | ICD-10-CM

## 2018-05-17 DIAGNOSIS — M6281 Muscle weakness (generalized): Secondary | ICD-10-CM

## 2018-05-17 NOTE — Patient Instructions (Addendum)
PELVIC TILT: Posterior    Tighten abdominals, flatten low back. _10__ reps per set, __2_ sets per day, while back is flared up.  Think about pushing into Joan Weaver's hand.     Copyright  VHI. All rights reserved.   Knee to Chest    Lying supine, bend involved knee to chest _3__ times and hold for 30 secs each. Repeat with other leg. Do _2__ times per day.  Copyright  VHI. All rights reserved.    Lower Trunk Rotation / Pelvic Opener    Lean on back with knees bent, rotate knees together on one side. Rotate to other side. Gentle slow motion.  Hold each position _5__ seconds. Repeat __10_ times. Do _2__ sessions per day.  Copyright  VHI. All rights reserved.    Access Code: [email or web address]  URL: [email or web address]  Date: 05/17/2018  Prepared by: Cameron Sprang   Exercises  Tandem Walking with Counter Support - 3 reps - 1 sets - 1x daily - 5x weekly  Standing Single Leg Stance with Counter Support - 3 reps - 1 sets - 1x daily - 5 x weekly  Romberg Stance with Head Nods on Foam Pad - 10 reps - 1 sets - 1x daily - 7x weekly  Romberg Stance on Foam Pad with Head Rotation - 10 reps - 1 sets - 1x daily - 7x weekly  Braided Sidestepping - 3 reps - 1 sets - 1x daily - 7x weekly

## 2018-05-17 NOTE — Therapy (Signed)
[facility] [address] [place], [place], [address] Phone: [phone]   Fax:  [phone]  Physical Therapy Treatment  Patient Details  Name: Joan Weaver MRN: [id number] Date of Birth: [birth year] Referring Provider: Jenna Luo, MD   Encounter Date: 05/17/2018  PT End of Session - 05/17/18 1429    Visit Number  8    Number of Visits  13    Date for PT Re-Evaluation  37/48/27   cert written for 90, plan to see 60 days   Authorization Type  Medicare    PT Start Time  1401    PT Stop Time  1445    PT Time Calculation (min)  44 min    Equipment Utilized During Treatment  Gait belt    Activity Tolerance  Patient tolerated treatment well    Behavior During Therapy  [facility] for tasks assessed/performed       Past Medical History:  Diagnosis Date  . Arthritis    knees, hips  . Breast cancer [facility]) August 2011   left - lumpectomy  . Cataract   . GERD (gastroesophageal reflux disease)    minor - tx with zantac  . H/O bladder infections   . Headache(784.0)    otc med prn  . Heart murmur    slight murmur -never had any problems  . Hernia   . Hypertension    no meds x 3 yrs  . Hypothyroidism   . Osteoporosis   . 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 Thyroid disease   . Ulcer   . Urinary incontinence     Past Surgical History:  Procedure Laterality Date  . APPENDECTOMY    . BREAST LUMPECTOMY     left  . EYE SURGERY     left- cataract   . HIATAL HERNIA REPAIR     x 2  . HYSTEROSCOPY W/D&C N/A 07/07/2013   Procedure: DILATATION AND CURETTAGE /HYSTEROSCOPY;  Surgeon: Anastasio Auerbach, MD;  Location: [facility];  Service: Gynecology;  Laterality: N/A;  . KNEE SURGERY     Arthroscopic  . left cataract surgery    . TONSILLECTOMY      There were no vitals filed for this visit.  Subjective Assessment - 05/17/18 1410    Subjective  Reports having increased back pain  today due to trouble getting in/out of friends high SUV on Wednesday.     Pertinent History  breast cancer (in remission), osteoporosis    Limitations  House hold activities;Walking;Standing    How long can you walk comfortably?  sometimes from one room to the next     Patient Stated Goals  "to be able to walk normally and stand and take better care of my house."     Currently in Pain?  Yes    Pain Score  7     Pain Location  Back    Pain Orientation  Lower    Pain Descriptors / Indicators  Aching;Sore    Pain Type  Chronic pain    Pain Onset  In the past 7 days    Pain Frequency  Constant    Aggravating Factors   getting in/out of low car    Pain Relieving Factors  lying down, bridging        TE:  Performed exercises below along with BLE bridging x 10 reps.     PELVIC TILT: Posterior  Tighten abdominals, flatten low back. _10__ reps per set, __2_ sets per day, while back is flared up.  Think about pushing into Joan Weaver hand.     Copyright  VHI. All rights reserved.   Knee to Chest    Lying supine, bend involved knee to chest _3__ times and hold for 30 secs each. Repeat with other leg. Do _2__ times per day.  Copyright  VHI. All rights reserved.    Lower Trunk Rotation / Pelvic Opener    Lean on back with knees bent, rotate knees together on one side. Rotate to other side. Gentle slow motion.  Hold each position _5__ seconds. Repeat __10_ times. Do _2__ sessions per day.  Copyright  VHI. All rights reserved.   NMR:   Access Code: [email or web address]  URL: [email or web address]  Date: 05/17/2018  Prepared by: Cameron Sprang   Exercises  Tandem Walking with Counter Support - 3 reps - 1 sets - 1x daily - 5x weekly  Standing Single Leg Stance with Counter Support - 3 reps - 1 sets - 1x daily - 5 x weekly  Romberg Stance with Head Nods on Foam Pad - 10 reps - 1 sets - 1x daily - 7x weekly  Romberg Stance on Foam Pad with Head Rotation - 10 reps - 1 sets -  1x daily - 7x weekly  Braided Sidestepping - 3 reps - 1 sets - 1x daily - 7x weekly                     PT Education - 05/17/18 1501    Education Details  Updated HEP, HEP for low back pain.     Person(s) Educated  Patient    Methods  Explanation;Demonstration;Handout    Comprehension  Verbalized understanding;Returned demonstration       PT Short Term Goals - 05/17/18 1429      PT SHORT TERM GOAL #1   Title  Pt will be independent with initial HEP in order to indicate decreased fall risk and improved functional strength.  (Target Date: 05/18/18)    Baseline  met     Time  4    Period  Weeks    Status  Achieved      PT SHORT TERM GOAL #2   Title  Pt will complete BERG balance test and improve score 4 points from baseline in order to indicate decreased fall risk.      Baseline  04/24/18: baseline score of 43/56 this date, 50/56 on 05/14/18    Time  4    Period  Weeks    Status  Achieved      PT SHORT TERM GOAL #3   Title  Will assess 6MWT and improve distance by 28' in order to indicate improved functional endurance.      Baseline  04/24/18: baseline distance of 765 feet with rollator, no rest breaks taken with supervision assist    Time  4    Period  Weeks    Status  New      PT SHORT TERM GOAL #4   Title  Pt will improve gait speed to >/=2.56 ft/sec w/ LRAD in order to indicate improved efficiency of gait and decreased fall risk.     Baseline  2.64 ft/sec with RW    Time  4    Period  Weeks    Status  Achieved      PT SHORT TERM GOAL #5   Title  Pt will ambulate x 300'  w/ LRAD at mod I level over paved unlevel outdoor surfaces (including curb) in order to indicate safe community negotiation.     Baseline  met 05/14/18    Time  4    Period  Weeks    Status  Achieved        PT Long Term Goals - 05/14/18 1512      PT LONG TERM GOAL #1   Title  Pt will be independent with final HEP in order to indicate decreased fall risk and improved functional strength.   (Target Date: 06/17/18)    Time  8    Period  Weeks    Status  New      PT LONG TERM GOAL #2   Title  Pt will improve BERG balance test 8 points from baseline in order to indicate decreased fall risk.     Time  8    Period  Weeks    Status  New      PT LONG TERM GOAL #3   Title  Pt will improve 6MWT distance by 150' from baseline in order to indicate improved functional endurance.     Time  8    Period  Weeks    Status  New      PT LONG TERM GOAL #4   Title  Pt will ambulate with gait speed >/=2.62 ft/sec in order to indicate safe community ambulation.     Time  8    Period  Weeks    Status  New      PT LONG TERM GOAL #5   Title  Pt will report being able to tolerate short shopping trips with use of RW/shopping cart in order to indicate improved community ambulation/endurance.      Baseline  met 05/14/18    Time  8    Period  Weeks    Status  Achieved      PT LONG TERM GOAL #6   Title  Pt will negotiate up/down 4 steps with single rail at mod I level in reciprocal pattern in order to indicate improved functional strength.     Time  8    Period  Weeks    Status  New            Plan - 05/17/18 1502    Clinical Impression Statement  Skilled session began with providing pt HEP for low back pain as she demonstrates more antalgic gait pattern effecting balance during session.  Also reviewed current HEP (except for pillow exercises from last session) and made updates.  STG #1 met however will wait and check 6MWT next session when feeling better.      Rehab Potential  Good    Clinical Impairments Affecting Rehab Potential  co-morbidities, current state of B knees due to arthritis     PT Frequency  2x / week   then 1x/wk for 4 more weeks   PT Duration  4 weeks   then 1x/wk for 4 more weeks   PT Treatment/Interventions  ADLs/Self Care Home Management;DME Instruction;Gait training;Stair training;Functional mobility training;Therapeutic activities;Therapeutic exercise;Balance  training;Neuromuscular re-education;Patient/family education;Passive range of motion;Energy conservation;Vestibular    PT Next Visit Plan  do 6MWT; Did exercises get better on pillow? continue to address safety with RW, especially with sit/stand transfers as pt tends to hold the walker; continue to work on LE strengthening, balance and activity tolerance.     Consulted and Agree with Plan of Care  Patient  Patient will benefit from skilled therapeutic intervention in order to improve the following deficits and impairments:  Difficulty walking, Decreased activity tolerance, Decreased balance, Decreased endurance, Decreased knowledge of use of DME, Decreased mobility, Decreased range of motion, Decreased strength, Dizziness, Impaired perceived functional ability, Impaired flexibility, Postural dysfunction, Impaired sensation, Pain(dizziness to be addressed as needed, she reports history of vertigo, pain to be addressed through therex)  Visit Diagnosis: Unsteadiness on feet  Muscle weakness (generalized)  Other abnormalities of gait and mobility  Abnormal posture     Problem List Patient Active Problem List   Diagnosis Date Noted  . UTI (lower urinary tract infection) 02/28/2016  . Restrictive lung disease 11/01/2015  . Osteoporosis 06/22/2015  . Breast cancer, left ([place]) 02/19/2014  . Acute bronchitis 09/09/2012  . Esophageal dysmotility 04/19/2012  . Benign esophageal stricture 04/06/2012  . History of hiatal hernia 03/06/2012  . Dysphagia 03/06/2012  . GERD (gastroesophageal reflux disease) 03/06/2012  . Pneumonia 12/29/2010    Cameron Sprang, PT, MPT [facility] [address] [place], [place], [address] Phone: [phone]   Fax:  [phone] 05/17/18, 3:05 PM  Name: Joan Weaver MRN: [id number] Date of Birth: [birth year]

## 2018-05-24 ENCOUNTER — Encounter: Payer: Self-pay | Admitting: Rehabilitation

## 2018-05-24 ENCOUNTER — Ambulatory Visit: Payer: Medicare Other | Admitting: Rehabilitation

## 2018-05-24 DIAGNOSIS — R2689 Other abnormalities of gait and mobility: Secondary | ICD-10-CM | POA: Diagnosis not present

## 2018-05-24 DIAGNOSIS — R293 Abnormal posture: Secondary | ICD-10-CM | POA: Diagnosis not present

## 2018-05-24 DIAGNOSIS — M6281 Muscle weakness (generalized): Secondary | ICD-10-CM

## 2018-05-24 DIAGNOSIS — R2681 Unsteadiness on feet: Secondary | ICD-10-CM

## 2018-05-24 NOTE — Patient Instructions (Signed)
TE:  Performed exercises below along with BLE bridging x 10 reps.   PELVIC TILT: Posterior    Tighten abdominals, flatten low back. _10__ reps per set, __2_ sets per day, while back is flared up.  Think about pushing into Taija Mathias's hand.     Copyright  VHI. All rights reserved.   Knee to Chest    Lying supine, bend involved knee to chest _3__ times and hold for 30 secs each. Repeat with other leg. Do _2__ times per day.  Copyright  VHI. All rights reserved.    Lower Trunk Rotation / Pelvic Opener    Lean on back with knees bent, rotate knees together on one side. Rotate to other side. Gentle slow motion.  Hold each position _5__ seconds. Repeat __10_ times. Do _2__ sessions per day.  KNEE: Extension, Long Arc Quads - Sitting    Raise leg until knee is straight.  Go only as far as you can without pain. Hold for 5 seconds.  _10__ reps per set, _1-2__ sets per day, _5-7__ days per week  Copyright  VHI. All rights reserved.

## 2018-05-24 NOTE — Therapy (Signed)
[facility] [address] [place], [place], [address] Phone: [phone]   Fax:  [phone]  Physical Therapy Treatment  Patient Details  Name: Joan Weaver MRN: [id number] Date of Birth: Apr 13, 1941 Referring Provider: Jenna Luo, MD   Encounter Date: 05/24/2018  PT End of Session - 05/24/18 1511    Visit Number  9    Number of Visits  13    Date for PT Re-Evaluation  03/55/97   cert written for 90, plan to see 60 days   Authorization Type  Medicare    PT Start Time  1320    PT Stop Time  1402    PT Time Calculation (min)  42 min    Equipment Utilized During Treatment  Gait belt    Activity Tolerance  Patient tolerated treatment well    Behavior During Therapy  [facility] for tasks assessed/performed       Past Medical History:  Diagnosis Date  . Arthritis    knees, hips  . Breast cancer [facility]) August 2011   left - lumpectomy  . Cataract   . GERD (gastroesophageal reflux disease)    minor - tx with zantac  . H/O bladder infections   . Headache(784.0)    otc med prn  . Heart murmur    slight murmur -never had any problems  . Hernia   . Hypertension    no meds x 3 yrs  . Hypothyroidism   . Osteoporosis   . 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 Thyroid disease   . Ulcer   . Urinary incontinence     Past Surgical History:  Procedure Laterality Date  . APPENDECTOMY    . BREAST LUMPECTOMY     left  . EYE SURGERY     left- cataract   . HIATAL HERNIA REPAIR     x 2  . HYSTEROSCOPY W/D&C N/A 07/07/2013   Procedure: DILATATION AND CURETTAGE /HYSTEROSCOPY;  Surgeon: Anastasio Auerbach, MD;  Location: [facility];  Service: Gynecology;  Laterality: N/A;  . KNEE SURGERY     Arthroscopic  . left cataract surgery    . TONSILLECTOMY      There were no vitals filed for this visit.  Subjective Assessment - 05/24/18 1321    Subjective  Reports more fatigue this  afternoon. Also reports increased knee pain this week, unsure of why.     Pertinent History  breast cancer (in remission), osteoporosis    Limitations  House hold activities;Walking;Standing    How long can you walk comfortably?  sometimes from one room to the next     Patient Stated Goals  "to be able to walk normally and stand and take better care of my house."     Currently in Pain?  Yes    Pain Score  2     Pain Location  Back    Pain Orientation  Lower    Pain Descriptors / Indicators  Aching    Pain Type  Acute pain         [facility] PT Assessment - 05/24/18 1333      6 Minute Walk- Baseline   6 Minute Walk- Baseline  yes    BP (mmHg)  120/81    HR (bpm)  82    02 Sat (%RA)  96 %    Modified Borg Scale for Dyspnea  0- Nothing at all    Perceived  Rate of Exertion (Borg)  6-      6 Minute walk- Post Test   6 Minute Walk Post Test  yes    BP (mmHg)  132/82    HR (bpm)  91    02 Sat (%RA)  98 %    Modified Borg Scale for Dyspnea  3- Moderate shortness of breath or breathing difficulty    Perceived Rate of Exertion (Borg)  13- Somewhat hard      6 minute walk test results    Aerobic Endurance Distance Walked  868    Endurance additional comments  with RW, no seated rest breaks          Self care:  Also verbally reviewed safe stair negotiation and pt able to report she is ascending/descending sideways with single rail to avoid increased pain.          [facility] Adult PT Treatment/Exercise - 05/24/18 1320      Ambulation/Gait   Ambulation/Gait  Yes    Ambulation/Gait Assistance  6: Modified independent (Device/Increase time)    Ambulation/Gait Assistance Details  for 6MWT, see section for details    Ambulation Distance (Feet)  868 Feet    Assistive device  Rolling walker    Gait Pattern  Step-through pattern;Decreased stride length    Ambulation Surface  Level;Indoor      Self-Care   Self-Care  Other Self-Care Comments    Other Self-Care Comments   Pt with  questions regarding post therapy gym exercise as well as being prepared for possible B knee replacements.  Discussed that PT would recommend she continue to do exercises given in therapy along with gym program which could consist of seated gym equipment (recumbent bike, nustep, etc) and pool exercises if she has access.  Discussed importance of maintaining activity level in order to maintain balance gains and endurance/strength gains, but also in order to have better outcome/recovery following knee surgery.  Pt reports that she did go to [facility] at one time to walk.  Discussed that walking is good, but does put impact on knee joints which could cause more pain.  Again recommend seated exercise machines and pool to avoid increasing pain.  PT looked up and printed [facility] aquatic and equipment room schedule and fees for pt for when therapy is complete.  Pt verbalized understanding.        Exercises   Exercises  Knee/Hip      Knee/Hip Exercises: Aerobic   Nustep  Level 2 with BUE/LE x 4 mins maintaining RPM >50.        Knee/Hip Exercises: Seated   Long Arc Quad  Strengthening;Both;10 reps;1 set   with 5 sec hold in pain free range            PT Education - 05/24/18 1511    Education Details  see self care     Person(s) Educated  Patient    Methods  Explanation    Comprehension  Verbalized understanding       PT Short Term Goals - 05/24/18 1349      PT SHORT TERM GOAL #1   Title  Pt will be independent with initial HEP in order to indicate decreased fall risk and improved functional strength.  (Target Date: 05/18/18)    Baseline  met     Time  4    Period  Weeks    Status  Achieved      PT SHORT TERM GOAL #2   Title  Pt will complete BERG balance test and improve score 4 points from baseline in order to indicate decreased fall risk.      Baseline  04/24/18: baseline score of 43/56 this date, 50/56 on 05/14/18    Time  4    Period  Weeks    Status  Achieved      PT  SHORT TERM GOAL #3   Title  Will assess 6MWT and improve distance by 30' in order to indicate improved functional endurance.      Baseline  04/24/18: baseline distance of 765 feet with rollator, no rest breaks taken with supervision assist, 58' with RW on 8/16, no seated rest breaks (103' improvement)    Time  4    Period  Weeks    Status  Achieved      PT SHORT TERM GOAL #4   Title  Pt will improve gait speed to >/=2.56 ft/sec w/ LRAD in order to indicate improved efficiency of gait and decreased fall risk.     Baseline  2.64 ft/sec with RW    Time  4    Period  Weeks    Status  Achieved      PT SHORT TERM GOAL #5   Title  Pt will ambulate x 300' w/ LRAD at mod I level over paved unlevel outdoor surfaces (including curb) in order to indicate safe community negotiation.     Baseline  met 05/14/18    Time  4    Period  Weeks    Status  Achieved        PT Long Term Goals - 05/14/18 1512      PT LONG TERM GOAL #1   Title  Pt will be independent with final HEP in order to indicate decreased fall risk and improved functional strength.  (Target Date: 06/17/18)    Time  8    Period  Weeks    Status  New      PT LONG TERM GOAL #2   Title  Pt will improve BERG balance test 8 points from baseline in order to indicate decreased fall risk.     Time  8    Period  Weeks    Status  New      PT LONG TERM GOAL #3   Title  Pt will improve 6MWT distance by 150' from baseline in order to indicate improved functional endurance.     Time  8    Period  Weeks    Status  New      PT LONG TERM GOAL #4   Title  Pt will ambulate with gait speed >/=2.62 ft/sec in order to indicate safe community ambulation.     Time  8    Period  Weeks    Status  New      PT LONG TERM GOAL #5   Title  Pt will report being able to tolerate short shopping trips with use of RW/shopping cart in order to indicate improved community ambulation/endurance.      Baseline  met 05/14/18    Time  8    Period  Weeks    Status   Achieved      PT LONG TERM GOAL #6   Title  Pt will negotiate up/down 4 steps with single rail at mod I level in reciprocal pattern in order to indicate improved functional strength.     Time  8    Period  Weeks    Status  New  Plan - 05/24/18 1511    Clinical Impression Statement  Skilled session focused on discussion of maintaining cardiovascular endurance, strength and balance post therapy and how this will help recovery if she does proceed with B knee replacements.  Provided more info on [facility].  Addressed last STG for 6MWT in which she increased distance by approx 100' meeting this goal. Also added single LAQ to HEP for quad strengthening.  Re-printed all exercises per pt request.      Rehab Potential  Good    Clinical Impairments Affecting Rehab Potential  co-morbidities, current state of B knees due to arthritis     PT Frequency  2x / week   then 1x/wk for 4 more weeks   PT Duration  4 weeks   then 1x/wk for 4 more weeks   PT Treatment/Interventions  ADLs/Self Care Home Management;DME Instruction;Gait training;Stair training;Functional mobility training;Therapeutic activities;Therapeutic exercise;Balance training;Neuromuscular re-education;Patient/family education;Passive range of motion;Energy conservation;Vestibular    PT Next Visit Plan  Work towards [place], continue to address safety with RW, especially with sit/stand transfers as pt tends to hold the walker; continue to work on LE strengthening, balance and activity tolerance.     Consulted and Agree with Plan of Care  Patient       Patient will benefit from skilled therapeutic intervention in order to improve the following deficits and impairments:  Difficulty walking, Decreased activity tolerance, Decreased balance, Decreased endurance, Decreased knowledge of use of DME, Decreased mobility, Decreased range of motion, Decreased strength, Dizziness, Impaired perceived functional ability, Impaired  flexibility, Postural dysfunction, Impaired sensation, Pain(dizziness to be addressed as needed, she reports history of vertigo, pain to be addressed through therex)  Visit Diagnosis: Unsteadiness on feet  Muscle weakness (generalized)  Other abnormalities of gait and mobility  Abnormal posture     Problem List Patient Active Problem List   Diagnosis Date Noted  . UTI (lower urinary tract infection) 02/28/2016  . Restrictive lung disease 11/01/2015  . Osteoporosis 06/22/2015  . Breast cancer, left ([place]) 02/19/2014  . Acute bronchitis 09/09/2012  . Esophageal dysmotility 04/19/2012  . Benign esophageal stricture 04/06/2012  . History of hiatal hernia 03/06/2012  . Dysphagia 03/06/2012  . GERD (gastroesophageal reflux disease) 03/06/2012  . Pneumonia 12/29/2010    Cameron Sprang, PT, MPT [facility] [address] [place], [place], [address] Phone: [phone]   Fax:  [phone] 05/24/18, 3:16 PM  Name: SHANTASIA HUNNELL MRN: [id number] Date of Birth: 1941-05-28

## 2018-05-31 ENCOUNTER — Ambulatory Visit: Payer: Medicare Other | Admitting: Rehabilitation

## 2018-06-07 ENCOUNTER — Ambulatory Visit: Payer: Medicare Other | Admitting: Rehabilitation

## 2018-06-07 ENCOUNTER — Encounter: Payer: Self-pay | Admitting: Rehabilitation

## 2018-06-07 DIAGNOSIS — M6281 Muscle weakness (generalized): Secondary | ICD-10-CM

## 2018-06-07 DIAGNOSIS — R2689 Other abnormalities of gait and mobility: Secondary | ICD-10-CM

## 2018-06-07 DIAGNOSIS — R293 Abnormal posture: Secondary | ICD-10-CM

## 2018-06-07 DIAGNOSIS — R2681 Unsteadiness on feet: Secondary | ICD-10-CM | POA: Diagnosis not present

## 2018-06-07 NOTE — Therapy (Signed)
[facility] [address] [place], [place], [address] Phone: [phone]   Fax:  [phone]  Physical Therapy Treatment  Patient Details  Name: Joan Weaver MRN: [id number] Date of Birth: 1941/07/07 Referring Provider: Jenna Luo, MD   Encounter Date: 06/07/2018  PT End of Session - 06/07/18 1411    Visit Number  10    Number of Visits  13    Date for PT Re-Evaluation  25/85/27   cert written for 90, plan to see 60 days   Authorization Type  Medicare    PT Start Time  1403    PT Stop Time  1448    PT Time Calculation (min)  45 min    Equipment Utilized During Treatment  Gait belt    Activity Tolerance  Patient tolerated treatment well    Behavior During Therapy  [facility] for tasks assessed/performed       Past Medical History:  Diagnosis Date  . Arthritis    knees, hips  . Breast cancer [facility]) August 2011   left - lumpectomy  . Cataract   . GERD (gastroesophageal reflux disease)    minor - tx with zantac  . H/O bladder infections   . Headache(784.0)    otc med prn  . Heart murmur    slight murmur -never had any problems  . Hernia   . Hypertension    no meds x 3 yrs  . Hypothyroidism   . Osteoporosis   . 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 Thyroid disease   . Ulcer   . Urinary incontinence     Past Surgical History:  Procedure Laterality Date  . APPENDECTOMY    . BREAST LUMPECTOMY     left  . EYE SURGERY     left- cataract   . HIATAL HERNIA REPAIR     x 2  . HYSTEROSCOPY W/D&C N/A 07/07/2013   Procedure: DILATATION AND CURETTAGE /HYSTEROSCOPY;  Surgeon: Anastasio Auerbach, MD;  Location: [facility];  Service: Gynecology;  Laterality: N/A;  . KNEE SURGERY     Arthroscopic  . left cataract surgery    . TONSILLECTOMY      There were no vitals filed for this visit.  Subjective Assessment - 06/07/18 1409    Subjective  Reports catching in the R knee  last night.      Pertinent History  breast cancer (in remission), osteoporosis    Limitations  House hold activities;Walking;Standing    How long can you walk comfortably?  sometimes from one room to the next     Patient Stated Goals  "to be able to walk normally and stand and take better care of my house."     Currently in Pain?  Yes    Pain Score  3     Pain Location  Knee    Pain Orientation  Right;Left    Pain Descriptors / Indicators  Aching    Pain Type  Acute pain    Pain Onset  More than a month ago    Pain Frequency  Constant    Aggravating Factors   stairs, increased walking    Pain Relieving Factors  rest                       [facility] Adult PT Treatment/Exercise - 06/07/18 1423      Ambulation/Gait   Ambulation/Gait  Yes  Ambulation/Gait Assistance  6: Modified independent (Device/Increase time);5: Supervision    Ambulation/Gait Assistance Details  Due to patient's progress with balance had pt ambulate with tripod cane as she has at home in order to asssess safety with LRAD in home environment.  Pt able to do at S to mod I level and recommend she begin to try ambulation at home with cane.  Pt verbalized understanding.      Ambulation Distance (Feet)  230 Feet    Assistive device  --   tripod cane   Gait Pattern  Step-through pattern;Decreased stride length    Ambulation Surface  Level;Indoor      Standardized Balance Assessment   Standardized Balance Assessment  Berg Balance Test      Berg Balance Test   Sit to Stand  Able to stand without using hands and stabilize independently    Standing Unsupported  Able to stand safely 2 minutes    Sitting with Back Unsupported but Feet Supported on Floor or Stool  Able to sit safely and securely 2 minutes    Stand to Sit  Sits safely with minimal use of hands    Transfers  Able to transfer safely, minor use of hands    Standing Unsupported with Eyes Closed  Able to stand 10 seconds safely    Standing Ubsupported  with Feet Together  Able to place feet together independently and stand 1 minute safely    From Standing, Reach Forward with Outstretched Arm  Can reach confidently >25 cm (10")    From Standing Position, Pick up Object from Floor  Able to pick up shoe safely and easily    From Standing Position, Turn to Look Behind Over each Shoulder  Looks behind from both sides and weight shifts well    Turn 360 Degrees  Able to turn 360 degrees safely in 4 seconds or less    Standing Unsupported, Alternately Place Feet on Step/Stool  Able to stand independently and safely and complete 8 steps in 20 seconds    Standing Unsupported, One Foot in Front  Able to place foot tandem independently and hold 30 seconds    Standing on One Leg  Able to lift leg independently and hold equal to or more than 3 seconds    Total Score  54      Self-Care   Self-Care  Other Self-Care Comments    Other Self-Care Comments   Pt with questions regarding POC ending soon (scheduled to be next week) however since she missed last week, she is asking to add one more week to end of POC.  PT agreeable to this plan since cert written for longer period.  Will add another visit to end of POC.  PT did recommend she get back to [facility] center next week at some point to make sure she is transitioning smoothly from therapy to community fitness.  Pt verbalized understanding.       Neuro Re-ed    Neuro Re-ed Details   Reviewed corner balance exercise from HEP per pt requet.  Performed feet together on compliant surface with EC performing head turns/nods x 10 reps each.  cues for more continuous motion, decreasing trunk rotation and to slightly separate feet with goal of placing back together for increased challenge.  Pt is making progress with this exercise.               PT Education - 06/07/18 1410    Education Details  Adding one week  to POC    Person(s) Educated  Patient    Methods  Explanation    Comprehension  Verbalized  understanding       PT Short Term Goals - 05/24/18 1349      PT SHORT TERM GOAL #1   Title  Pt will be independent with initial HEP in order to indicate decreased fall risk and improved functional strength.  (Target Date: 05/18/18)    Baseline  met     Time  4    Period  Weeks    Status  Achieved      PT SHORT TERM GOAL #2   Title  Pt will complete BERG balance test and improve score 4 points from baseline in order to indicate decreased fall risk.      Baseline  04/24/18: baseline score of 43/56 this date, 50/56 on 05/14/18    Time  4    Period  Weeks    Status  Achieved      PT SHORT TERM GOAL #3   Title  Will assess 6MWT and improve distance by 41' in order to indicate improved functional endurance.      Baseline  04/24/18: baseline distance of 765 feet with rollator, no rest breaks taken with supervision assist, 4' with RW on 8/16, no seated rest breaks (103' improvement)    Time  4    Period  Weeks    Status  Achieved      PT SHORT TERM GOAL #4   Title  Pt will improve gait speed to >/=2.56 ft/sec w/ LRAD in order to indicate improved efficiency of gait and decreased fall risk.     Baseline  2.64 ft/sec with RW    Time  4    Period  Weeks    Status  Achieved      PT SHORT TERM GOAL #5   Title  Pt will ambulate x 300' w/ LRAD at mod I level over paved unlevel outdoor surfaces (including curb) in order to indicate safe community negotiation.     Baseline  met 05/14/18    Time  4    Period  Weeks    Status  Achieved        PT Long Term Goals - 06/07/18 1429      PT LONG TERM GOAL #1   Title  Pt will be independent with final HEP in order to indicate decreased fall risk and improved functional strength.  (Target Date: 06/24/18-date updated to reflect missing one week)    Time  8    Period  Weeks    Status  New      PT LONG TERM GOAL #2   Title  Pt will improve BERG balance test 8 points from baseline in order to indicate decreased fall risk.     Baseline  54/56 on  06/07/18    Time  8    Period  Weeks    Status  Achieved      PT LONG TERM GOAL #3   Title  Pt will improve 6MWT distance by 150' from baseline in order to indicate improved functional endurance.     Time  8    Period  Weeks    Status  New      PT LONG TERM GOAL #4   Title  Pt will ambulate with gait speed >/=2.62 ft/sec in order to indicate safe community ambulation.     Time  8    Period  Weeks  Status  New      PT LONG TERM GOAL #5   Title  Pt will report being able to tolerate short shopping trips with use of RW/shopping cart in order to indicate improved community ambulation/endurance.      Baseline  met 05/14/18    Time  8    Period  Weeks    Status  Achieved      PT LONG TERM GOAL #6   Title  Pt will negotiate up/down 4 steps with single rail at mod I level in reciprocal pattern in order to indicate improved functional strength.     Time  8    Period  Weeks    Status  New            Plan - 06/07/18 1614    Clinical Impression Statement  Skilled session focused on beginning to assess LTGs for possible D/C next week.  Pt making excellent progress but would like to add one more week to wrap up and get back into [facility].  Due to her missing last week, PT agreeable.  Will update POC.     Rehab Potential  Good    Clinical Impairments Affecting Rehab Potential  co-morbidities, current state of B knees due to arthritis     PT Frequency  2x / week   then 1x/wk for 4 more weeks   PT Duration  4 weeks   then 1x/wk for 4 more weeks   PT Treatment/Interventions  ADLs/Self Care Home Management;DME Instruction;Gait training;Stair training;Functional mobility training;Therapeutic activities;Therapeutic exercise;Balance training;Neuromuscular re-education;Patient/family education;Passive range of motion;Energy conservation;Vestibular    PT Next Visit Plan  Work towards [place], continue to address safety with RW, gait indoors with tripod cane, especially with sit/stand  transfers as pt tends to hold the walker; continue to work on LE strengthening, balance and activity tolerance.     Consulted and Agree with Plan of Care  Patient       Patient will benefit from skilled therapeutic intervention in order to improve the following deficits and impairments:  Difficulty walking, Decreased activity tolerance, Decreased balance, Decreased endurance, Decreased knowledge of use of DME, Decreased mobility, Decreased range of motion, Decreased strength, Dizziness, Impaired perceived functional ability, Impaired flexibility, Postural dysfunction, Impaired sensation, Pain(dizziness to be addressed as needed, she reports history of vertigo, pain to be addressed through therex)  Visit Diagnosis: Unsteadiness on feet  Muscle weakness (generalized)  Other abnormalities of gait and mobility  Abnormal posture     Problem List Patient Active Problem List   Diagnosis Date Noted  . UTI (lower urinary tract infection) 02/28/2016  . Restrictive lung disease 11/01/2015  . Osteoporosis 06/22/2015  . Breast cancer, left ([place]) 02/19/2014  . Acute bronchitis 09/09/2012  . Esophageal dysmotility 04/19/2012  . Benign esophageal stricture 04/06/2012  . History of hiatal hernia 03/06/2012  . Dysphagia 03/06/2012  . GERD (gastroesophageal reflux disease) 03/06/2012  . Pneumonia 12/29/2010    Cameron Sprang, PT, MPT [facility] [address] [place], [place], [address] Phone: [phone]   Fax:  [phone] 06/07/18, 4:16 PM  Name: NACOLE FLUHR MRN: [id number] Date of Birth: [birth year]

## 2018-06-14 ENCOUNTER — Ambulatory Visit: Payer: Medicare Other | Attending: Family Medicine | Admitting: Rehabilitation

## 2018-06-14 ENCOUNTER — Encounter: Payer: Self-pay | Admitting: Rehabilitation

## 2018-06-14 DIAGNOSIS — M6281 Muscle weakness (generalized): Secondary | ICD-10-CM | POA: Insufficient documentation

## 2018-06-14 DIAGNOSIS — R2689 Other abnormalities of gait and mobility: Secondary | ICD-10-CM | POA: Insufficient documentation

## 2018-06-14 DIAGNOSIS — R293 Abnormal posture: Secondary | ICD-10-CM | POA: Diagnosis not present

## 2018-06-14 DIAGNOSIS — R2681 Unsteadiness on feet: Secondary | ICD-10-CM | POA: Insufficient documentation

## 2018-06-14 NOTE — Therapy (Signed)
[facility] [address] [place], [place], [address] Phone: [phone]   Fax:  [phone]  Physical Therapy Treatment  Patient Details  Name: Joan Weaver MRN: [id number] Date of Birth: [birth year] Referring Provider: Jenna Luo, MD   Encounter Date: 06/14/2018  PT End of Session - 06/14/18 1517    Visit Number  11    Number of Visits  13    Date for PT Re-Evaluation  90/24/09   cert written for 90, plan to see 60 days   Authorization Type  Medicare    PT Start Time  1402    PT Stop Time  1448    PT Time Calculation (min)  46 min    Equipment Utilized During Treatment  Gait belt    Activity Tolerance  Patient tolerated treatment well    Behavior During Therapy  [facility] for tasks assessed/performed       Past Medical History:  Diagnosis Date  . Arthritis    knees, hips  . Breast cancer [facility]) August 2011   left - lumpectomy  . Cataract   . GERD (gastroesophageal reflux disease)    minor - tx with zantac  . H/O bladder infections   . Headache(784.0)    otc med prn  . Heart murmur    slight murmur -never had any problems  . Hernia   . Hypertension    no meds x 3 yrs  . Hypothyroidism   . Osteoporosis   . 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 Thyroid disease   . Ulcer   . Urinary incontinence     Past Surgical History:  Procedure Laterality Date  . APPENDECTOMY    . BREAST LUMPECTOMY     left  . EYE SURGERY     left- cataract   . HIATAL HERNIA REPAIR     x 2  . HYSTEROSCOPY W/D&C N/A 07/07/2013   Procedure: DILATATION AND CURETTAGE /HYSTEROSCOPY;  Surgeon: Anastasio Auerbach, MD;  Location: [facility];  Service: Gynecology;  Laterality: N/A;  . KNEE SURGERY     Arthroscopic  . left cataract surgery    . TONSILLECTOMY      There were no vitals filed for this visit.  Subjective Assessment - 06/14/18 1407    Subjective  Pt reports knee pain is better  today vs last few days.     Pertinent History  breast cancer (in remission), osteoporosis    Limitations  House hold activities;Walking;Standing    How long can you walk comfortably?  sometimes from one room to the next     Patient Stated Goals  "to be able to walk normally and stand and take better care of my house."     Currently in Pain?  Yes    Pain Score  2     Pain Location  Knee    Pain Orientation  Right    Pain Descriptors / Indicators  Aching    Pain Type  Acute pain    Pain Onset  More than a month ago    Pain Frequency  Intermittent    Aggravating Factors   weather, walking    Pain Relieving Factors  rest                        [facility] Adult PT Treatment/Exercise - 06/14/18 1405      Ambulation/Gait   Ambulation/Gait  Yes    Ambulation/Gait Assistance  6: Modified independent (Device/Increase time);5: Supervision    Ambulation/Gait Assistance Details  S for safety at times, esp when making turns and end of session due to fatigue as pt presents today with [facility] into clinic after PT had advised to use tripod cane in house only and RW in community.  Min cues for sequencing and safety.  Recommended pt use RW for remainder of day (she reports she is going to [facility] following session to get set up for post therapy fitness). Pt verbalized understanding.     Ambulation Distance (Feet)  200 Feet    Assistive device  Straight cane    Gait Pattern  Step-through pattern;Decreased stride length    Ambulation Surface  Level;Indoor    Stairs  Yes    Stairs Assistance  6: Modified independent (Device/Increase time);4: Min guard    Stairs Assistance Details (indicate cue type and reason)  Pt able to ascend stairs with single rail and cane at mod I level in reciprocal pattern, however she is unable to descend safely in reciprocal pattern due to knee instability and pain, therefore recommend step to pattern esp when descending for safety.     Stair Management Technique   One rail Right;Alternating pattern;Step to pattern;Forwards;With cane    Number of Stairs  4    Height of Stairs  6      Neuro Re-ed    Neuro Re-ed Details   Continue to address balance deficits with multi-sensory challenges in // bars; standing on foam balance beam perpendicularly maintaining balance x 20 secs with feet apart, feet apart EC x 2 sets of 20 secs, with min cues for improving forward weight shift with hips.  Progressed to tapping heel forward alternating LEs x 10 reps, tapping heel posteriorly x 10 reps, forward stepping with anterior loading x 10 reps and posterior stepping with loading x 10 reps.  All with light intermittent UE support to challenge ankle/hip/stepping strategy along with SLS on compliant surfaces.  Continue to review corner exercises on compliant surface in which pt needs cues for head motion only as she tends to rotate trunk as well.  Also cues for continuous motion but in smaller range to decrease trunk motion.  See pt instruction for exercises performed from HEP.              PT Education - 06/14/18 1409    Education Details  Exercises/machines to try at [facility] with trainer.     Person(s) Educated  Patient    Methods  Explanation;Handout    Comprehension  Verbalized understanding       PT Short Term Goals - 05/24/18 1349      PT SHORT TERM GOAL #1   Title  Pt will be independent with initial HEP in order to indicate decreased fall risk and improved functional strength.  (Target Date: 05/18/18)    Baseline  met     Time  4    Period  Weeks    Status  Achieved      PT SHORT TERM GOAL #2   Title  Pt will complete BERG balance test and improve score 4 points from baseline in order to indicate decreased fall risk.      Baseline  04/24/18: baseline score of 43/56 this date, 50/56 on 05/14/18    Time  4    Period  Weeks    Status  Achieved      PT SHORT  TERM GOAL #3   Title  Will assess 6MWT and improve distance by 45' in order to indicate  improved functional endurance.      Baseline  04/24/18: baseline distance of 765 feet with rollator, no rest breaks taken with supervision assist, 91' with RW on 8/16, no seated rest breaks (103' improvement)    Time  4    Period  Weeks    Status  Achieved      PT SHORT TERM GOAL #4   Title  Pt will improve gait speed to >/=2.56 ft/sec w/ LRAD in order to indicate improved efficiency of gait and decreased fall risk.     Baseline  2.64 ft/sec with RW    Time  4    Period  Weeks    Status  Achieved      PT SHORT TERM GOAL #5   Title  Pt will ambulate x 300' w/ LRAD at mod I level over paved unlevel outdoor surfaces (including curb) in order to indicate safe community negotiation.     Baseline  met 05/14/18    Time  4    Period  Weeks    Status  Achieved        PT Long Term Goals - 06/07/18 1429      PT LONG TERM GOAL #1   Title  Pt will be independent with final HEP in order to indicate decreased fall risk and improved functional strength.  (Target Date: 06/24/18-date updated to reflect missing one week)    Time  8    Period  Weeks    Status  New      PT LONG TERM GOAL #2   Title  Pt will improve BERG balance test 8 points from baseline in order to indicate decreased fall risk.     Baseline  54/56 on 06/07/18    Time  8    Period  Weeks    Status  Achieved      PT LONG TERM GOAL #3   Title  Pt will improve 6MWT distance by 150' from baseline in order to indicate improved functional endurance.     Time  8    Period  Weeks    Status  New      PT LONG TERM GOAL #4   Title  Pt will ambulate with gait speed >/=2.62 ft/sec in order to indicate safe community ambulation.     Time  8    Period  Weeks    Status  New      PT LONG TERM GOAL #5   Title  Pt will report being able to tolerate short shopping trips with use of RW/shopping cart in order to indicate improved community ambulation/endurance.      Baseline  met 05/14/18    Time  8    Period  Weeks    Status  Achieved       PT LONG TERM GOAL #6   Title  Pt will negotiate up/down 4 steps with single rail at mod I level in reciprocal pattern in order to indicate improved functional strength.     Time  8    Period  Weeks    Status  New            Plan - 06/14/18 1517    Clinical Impression Statement  Skilled session focused on gait with SPC indoors, stairs to address LTG, and continued high level balance with multi sensory challenges.  Pt planning on going  to [facility] today following session to get set up with post therapy fitness plans.     Rehab Potential  Good    Clinical Impairments Affecting Rehab Potential  co-morbidities, current state of B knees due to arthritis     PT Frequency  2x / week   then 1x/wk for 4 more weeks   PT Duration  4 weeks   then 1x/wk for 4 more weeks   PT Treatment/Interventions  ADLs/Self Care Home Management;DME Instruction;Gait training;Stair training;Functional mobility training;Therapeutic activities;Therapeutic exercise;Balance training;Neuromuscular re-education;Patient/family education;Passive range of motion;Energy conservation;Vestibular    PT Next Visit Plan  check LTGs, see how [facility] going     [employer] and Agree with Plan of Care  Patient       Patient will benefit from skilled therapeutic intervention in order to improve the following deficits and impairments:  Difficulty walking, Decreased activity tolerance, Decreased balance, Decreased endurance, Decreased knowledge of use of DME, Decreased mobility, Decreased range of motion, Decreased strength, Dizziness, Impaired perceived functional ability, Impaired flexibility, Postural dysfunction, Impaired sensation, Pain(dizziness to be addressed as needed, she reports history of vertigo, pain to be addressed through therex)  Visit Diagnosis: Unsteadiness on feet  Muscle weakness (generalized)  Other abnormalities of gait and mobility  Abnormal posture     Problem List Patient Active  Problem List   Diagnosis Date Noted  . UTI (lower urinary tract infection) 02/28/2016  . Restrictive lung disease 11/01/2015  . Osteoporosis 06/22/2015  . Breast cancer, left ([place]) 02/19/2014  . Acute bronchitis 09/09/2012  . Esophageal dysmotility 04/19/2012  . Benign esophageal stricture 04/06/2012  . History of hiatal hernia 03/06/2012  . Dysphagia 03/06/2012  . GERD (gastroesophageal reflux disease) 03/06/2012  . Pneumonia 12/29/2010    Cameron Sprang, PT, MPT [facility] [address] [place], [place], [address] Phone: [phone]   Fax:  [phone] 06/14/18, 3:19 PM  Name: Joan Weaver MRN: [id number] Date of Birth: 09-22-1941

## 2018-06-14 NOTE — Patient Instructions (Signed)
Access Code: [email or web address]  URL: [email or web address]  Date: 06/14/2018  Prepared by: Cameron Sprang   Exercises  Tandem Walking with Counter Support - 3 reps - 1 sets - 1x daily - 5x weekly  Standing Single Leg Stance with Counter Support - 3 reps - 1 sets - 1x daily - 5 x weekly  Romberg Stance with Head Nods on Foam Pad - 10 reps - 1 sets - 1x daily - 7x weekly  Romberg Stance on Foam Pad with Head Rotation - 10 reps - 1 sets - 1x daily - 7x weekly  Braided Sidestepping - 3 reps - 1 sets - 1x daily - 7x weekly

## 2018-06-21 ENCOUNTER — Ambulatory Visit: Payer: Medicare Other | Admitting: Rehabilitation

## 2018-06-24 ENCOUNTER — Ambulatory Visit: Payer: Medicare Other | Admitting: Rehabilitation

## 2018-06-25 ENCOUNTER — Ambulatory Visit: Payer: Medicare Other | Admitting: Rehabilitation

## 2018-06-25 ENCOUNTER — Encounter: Payer: Self-pay | Admitting: Rehabilitation

## 2018-06-25 DIAGNOSIS — M6281 Muscle weakness (generalized): Secondary | ICD-10-CM

## 2018-06-25 DIAGNOSIS — R2681 Unsteadiness on feet: Secondary | ICD-10-CM

## 2018-06-25 DIAGNOSIS — R293 Abnormal posture: Secondary | ICD-10-CM | POA: Diagnosis not present

## 2018-06-25 DIAGNOSIS — R2689 Other abnormalities of gait and mobility: Secondary | ICD-10-CM

## 2018-06-26 NOTE — Therapy (Signed)
[facility] [address] [place], [place], [address] Phone: [phone]   Fax:  [phone]  Physical Therapy Treatment and D/C Summary   Patient Details  Name: Joan Weaver MRN: [id number] Date of Birth: 01-10-1941 Referring Provider: Jenna Luo, MD   Encounter Date: 06/25/2018  PT End of Session - 06/25/18 1412    Visit Number  12    Number of Visits  13    Date for PT Re-Evaluation  67/20/94   cert written for 90, plan to see 60 days   Authorization Type  Medicare    PT Start Time  1403    PT Stop Time  1445    PT Time Calculation (min)  42 min    Equipment Utilized During Treatment  Gait belt    Activity Tolerance  Patient tolerated treatment well    Behavior During Therapy  [facility] for tasks assessed/performed       Past Medical History:  Diagnosis Date  . Arthritis    knees, hips  . Breast cancer [facility]) August 2011   left - lumpectomy  . Cataract   . GERD (gastroesophageal reflux disease)    minor - tx with zantac  . H/O bladder infections   . Headache(784.0)    otc med prn  . Heart murmur    slight murmur -never had any problems  . Hernia   . Hypertension    no meds x 3 yrs  . Hypothyroidism   . Osteoporosis   . 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 Thyroid disease   . Ulcer   . Urinary incontinence     Past Surgical History:  Procedure Laterality Date  . APPENDECTOMY    . BREAST LUMPECTOMY     left  . EYE SURGERY     left- cataract   . HIATAL HERNIA REPAIR     x 2  . HYSTEROSCOPY W/D&C N/A 07/07/2013   Procedure: DILATATION AND CURETTAGE /HYSTEROSCOPY;  Surgeon: Anastasio Auerbach, MD;  Location: [facility];  Service: Gynecology;  Laterality: N/A;  . KNEE SURGERY     Arthroscopic  . left cataract surgery    . TONSILLECTOMY      There were no vitals filed for this visit.  Subjective Assessment - 06/25/18 1411    Subjective  Pt reports  being sick over the last week with increased coughing and decreased energy.      Pertinent History  breast cancer (in remission), osteoporosis    Limitations  House hold activities;Walking;Standing    How long can you walk comfortably?  sometimes from one room to the next     Patient Stated Goals  "to be able to walk normally and stand and take better care of my house."     Currently in Pain?  Yes    Pain Score  2     Pain Location  Knee    Pain Orientation  Right    Pain Descriptors / Indicators  Aching    Pain Type  Acute pain    Pain Onset  In the past 7 days    Pain Frequency  Constant    Aggravating Factors   being sick         [facility] PT Assessment - 06/25/18 1420      6 Minute Walk- Baseline   6 Minute Walk- Baseline  yes    BP (mmHg)  105/80  HR (bpm)  88    02 Sat (%RA)  96 %    Modified Borg Scale for Dyspnea  0- Nothing at all    Perceived Rate of Exertion (Borg)  9- very light      6 Minute walk- Post Test   6 Minute Walk Post Test  yes    HR (bpm)  101    02 Sat (%RA)  96 %    Modified Borg Scale for Dyspnea  2- Mild shortness of breath    Perceived Rate of Exertion (Borg)  13- Somewhat hard      6 minute walk test results    Aerobic Endurance Distance Walked  800    Endurance additional comments  with RW, however gym very crowded and pt having to make several turns and sudden stops to accomodate                   [facility] Adult PT Treatment/Exercise - 06/25/18 1410      Ambulation/Gait   Gait velocity  2.42 ft/sec with RW      Self-Care   Self-Care  Other Self-Care Comments    Other Self-Care Comments   PT recommended follow up with pulmonolgist due to lung condition and recent illness with coughing and SOB.  Pt verbalized understanding.       Neuro Re-ed    Neuro Re-ed Details   Reviewed HEP for LTGs.  see pt instruction for details.              PT Education - 06/25/18 1412    Education Details  see self care.  Continued  encouragement to get appt with trainer at [facility].     Person(s) Educated  Patient    Methods  Explanation    Comprehension  Verbalized understanding       PT Short Term Goals - 05/24/18 1349      PT SHORT TERM GOAL #1   Title  Pt will be independent with initial HEP in order to indicate decreased fall risk and improved functional strength.  (Target Date: 05/18/18)    Baseline  met     Time  4    Period  Weeks    Status  Achieved      PT SHORT TERM GOAL #2   Title  Pt will complete BERG balance test and improve score 4 points from baseline in order to indicate decreased fall risk.      Baseline  04/24/18: baseline score of 43/56 this date, 50/56 on 05/14/18    Time  4    Period  Weeks    Status  Achieved      PT SHORT TERM GOAL #3   Title  Will assess 6MWT and improve distance by 84' in order to indicate improved functional endurance.      Baseline  04/24/18: baseline distance of 765 feet with rollator, no rest breaks taken with supervision assist, 59' with RW on 8/16, no seated rest breaks (103' improvement)    Time  4    Period  Weeks    Status  Achieved      PT SHORT TERM GOAL #4   Title  Pt will improve gait speed to >/=2.56 ft/sec w/ LRAD in order to indicate improved efficiency of gait and decreased fall risk.     Baseline  2.64 ft/sec with RW    Time  4    Period  Weeks    Status  Achieved  PT SHORT TERM GOAL #5   Title  Pt will ambulate x 300' w/ LRAD at mod I level over paved unlevel outdoor surfaces (including curb) in order to indicate safe community negotiation.     Baseline  met 05/14/18    Time  4    Period  Weeks    Status  Achieved        PT Long Term Goals - 06/25/18 1412      PT LONG TERM GOAL #1   Title  Pt will be independent with final HEP in order to indicate decreased fall risk and improved functional strength.  (Target Date: 06/24/18-date updated to reflect missing one week)    Baseline  met 06/25/18    Time  8    Period  Weeks     Status  Achieved      PT LONG TERM GOAL #2   Title  Pt will improve BERG balance test 8 points from baseline in order to indicate decreased fall risk.     Baseline  54/56 on 06/07/18    Time  8    Period  Weeks    Status  Achieved      PT LONG TERM GOAL #3   Title  Pt will improve 6MWT distance by 150' from baseline in order to indicate improved functional endurance.     Baseline  765' baseline, 800' on 06/25/18 (improved from baseline, however lower than STG due to crowded gym and recently being sick).     Time  8    Period  Weeks    Status  Partially Met      PT LONG TERM GOAL #4   Title  Pt will ambulate with gait speed >/=2.62 ft/sec in order to indicate safe community ambulation.     Baseline  2.42 ft/sec with RW 06/25/18    Time  8    Period  Weeks    Status  Partially Met      PT LONG TERM GOAL #5   Title  Pt will report being able to tolerate short shopping trips with use of RW/shopping cart in order to indicate improved community ambulation/endurance.      Baseline  met 05/14/18    Time  8    Period  Weeks    Status  Achieved      PT LONG TERM GOAL #6   Title  Pt will negotiate up/down 4 steps with single rail at mod I level in reciprocal pattern in order to indicate improved functional strength.     Time  8    Period  Weeks    Status  Deferred            Plan - 06/26/18 1049    Clinical Impression Statement  Skilled session focused on assessment of remaining LTGs.  Pt has met 3/6 STGs, partially meeting 2/6 (likely due to recent illness and decreased energy and crowded gym for 6MWT) with PT deferring stair goal due to B knee OA and pain. Pt ready for D/C and to transition to [facility] for continued fitness.      Rehab Potential  Good    Clinical Impairments Affecting Rehab Potential  co-morbidities, current state of B knees due to arthritis     PT Frequency  2x / week   then 1x/wk for 4 more weeks   PT Duration  4 weeks   then 1x/wk for 4 more weeks    PT Treatment/Interventions  ADLs/Self Care  Home Management;DME Instruction;Gait training;Stair training;Functional mobility training;Therapeutic activities;Therapeutic exercise;Balance training;Neuromuscular re-education;Patient/family education;Passive range of motion;Energy conservation;Vestibular    PT Next Visit Plan  --    Consulted and Agree with Plan of Care  Patient       Patient will benefit from skilled therapeutic intervention in order to improve the following deficits and impairments:  Difficulty walking, Decreased activity tolerance, Decreased balance, Decreased endurance, Decreased knowledge of use of DME, Decreased mobility, Decreased range of motion, Decreased strength, Dizziness, Impaired perceived functional ability, Impaired flexibility, Postural dysfunction, Impaired sensation, Pain(dizziness to be addressed as needed, she reports history of vertigo, pain to be addressed through therex)  Visit Diagnosis: Unsteadiness on feet  Muscle weakness (generalized)  Other abnormalities of gait and mobility  Abnormal posture    PHYSICAL THERAPY DISCHARGE SUMMARY  Visits from Start of Care: 12  Current functional level related to goals / functional outcomes: See LTGs above   Remaining deficits: Continued endurance deficits which much of which are from premorbid lung condition, balance and strength deficits.  Has HEP to address.     Education / Equipment: HEP and plans to go to [employer].   Plan: Patient agrees to discharge.  Patient goals were partially met. Patient is being discharged due to meeting the stated rehab goals.  ?????       Problem List Patient Active Problem List   Diagnosis Date Noted  . UTI (lower urinary tract infection) 02/28/2016  . Restrictive lung disease 11/01/2015  . Osteoporosis 06/22/2015  . Breast cancer, left ([place]) 02/19/2014  . Acute bronchitis 09/09/2012  . Esophageal dysmotility 04/19/2012  . Benign esophageal stricture  04/06/2012  . History of hiatal hernia 03/06/2012  . Dysphagia 03/06/2012  . GERD (gastroesophageal reflux disease) 03/06/2012  . Pneumonia 12/29/2010    Cameron Sprang, PT, MPT [facility] [address] [place], [place], [address] Phone: [phone]   Fax:  [phone] 06/26/18, 10:54 AM  Name: OLUWADEMILADE KELLETT MRN: [id number] Date of Birth: 1940-12-23

## 2018-06-26 NOTE — Patient Instructions (Signed)
Access Code: [email or web address]  URL: [email or web address]  Date: 05/17/2018  Prepared by: Cameron Sprang   Exercises   Tandem Walking with Counter Support - 3 reps - 1 sets - 1x daily - 5x weekly   Standing Single Leg Stance with Counter Support - 3 reps - 1 sets - 1x daily - 5 x weekly   Romberg Stance with Head Nods on Foam Pad - 10 reps - 1 sets - 1x daily - 7x weekly   Romberg Stance on Foam Pad with Head Rotation - 10 reps - 1 sets - 1x daily - 7x weekly   Braided Sidestepping - 3 reps - 1 sets - 1x daily - 7x weekly

## 2018-07-16 ENCOUNTER — Encounter: Payer: Self-pay | Admitting: Adult Health

## 2018-07-16 ENCOUNTER — Other Ambulatory Visit (INDEPENDENT_AMBULATORY_CARE_PROVIDER_SITE_OTHER): Payer: Medicare Other

## 2018-07-16 ENCOUNTER — Ambulatory Visit (INDEPENDENT_AMBULATORY_CARE_PROVIDER_SITE_OTHER): Payer: Medicare Other | Admitting: Adult Health

## 2018-07-16 ENCOUNTER — Ambulatory Visit (INDEPENDENT_AMBULATORY_CARE_PROVIDER_SITE_OTHER)
Admission: RE | Admit: 2018-07-16 | Discharge: 2018-07-16 | Disposition: A | Discharge status: Home / Self Care | Payer: Medicare Other | Source: Ambulatory Visit | Attending: Adult Health | Admitting: Adult Health

## 2018-07-16 VITALS — BP 120/70 | HR 79 | Ht 61.0 in | Wt 202.0 lb

## 2018-07-16 DIAGNOSIS — Z23 Encounter for immunization: Secondary | ICD-10-CM

## 2018-07-16 DIAGNOSIS — R0602 Shortness of breath: Secondary | ICD-10-CM

## 2018-07-16 DIAGNOSIS — J189 Pneumonia, unspecified organism: Secondary | ICD-10-CM

## 2018-07-16 DIAGNOSIS — J209 Acute bronchitis, unspecified: Secondary | ICD-10-CM

## 2018-07-16 DIAGNOSIS — K224 Dyskinesia of esophagus: Secondary | ICD-10-CM

## 2018-07-16 DIAGNOSIS — R05 Cough: Secondary | ICD-10-CM | POA: Diagnosis not present

## 2018-07-16 LAB — BASIC METABOLIC PANEL
BUN: 20 mg/dL (ref 6–23)
CO2: 28 meq/L (ref 19–32)
Calcium: 9.5 mg/dL (ref 8.4–10.5)
Chloride: 101 mEq/L (ref 96–112)
Creatinine, Ser: 1.07 mg/dL (ref 0.40–1.20)
GFR: 52.73 mL/min — ABNORMAL LOW (ref >60.00)
Glucose, Bld: 99 mg/dL (ref 70–99)
Potassium: 3.9 mEq/L (ref 3.5–5.1)
Sodium: 138 mEq/L (ref 135–145)

## 2018-07-16 LAB — SEDIMENTATION RATE: Sed Rate: 26 mm/hr (ref 0–30)

## 2018-07-16 LAB — BRAIN NATRIURETIC PEPTIDE: Pro B Natriuretic peptide (BNP): 82 pg/mL (ref 0.0–100.0)

## 2018-07-16 NOTE — Assessment & Plan Note (Signed)
Recent flare seems to be resolving after recent abx.  Has lingering cough and dyspnea (seems chronic as well)  Also on daily macrodantin  Check CXR , ESR and BNP .  Pending CXR results , consider HRCT chest  PFT on return   Plan  Patient Instructions  Chest xray today  Flu shot today .  GERD diet along with aspiration precautions as discussed.  Mucinex Twice daily  As needed  Cough/congestion .  Delsym 2 tsp Twice daily  As needed  Cough. Labs today .   Follow up with Dr. Elsworth Soho  Or Kaidyn Javid with PFT in 1-2 weeks and As needed   Please contact office for sooner follow up if symptoms do not improve or worsen or seek emergency care

## 2018-07-16 NOTE — Patient Instructions (Addendum)
Chest xray today  Flu shot today .  GERD diet along with aspiration precautions as discussed.  Mucinex Twice daily  As needed  Cough/congestion .  Delsym 2 tsp Twice daily  As needed  Cough. Labs today .   Follow up with Dr. Elsworth Soho  Or Lyndzee Kliebert with PFT in 1-2 weeks and As needed   Please contact office for sooner follow up if symptoms do not improve or worsen or seek emergency care

## 2018-07-16 NOTE — Assessment & Plan Note (Signed)
Continue on GERD diet . Aspiration precautions

## 2018-07-16 NOTE — Assessment & Plan Note (Signed)
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  She continues to have ongoing dyspnea with activity ? Etiology . Previous PFT was normal with nml DLCO as well.  Will repeat PFT , check CXR . Also on macrodantin  Check ESR (was elevated with last pneumonitis ) .   Plan  Patient Instructions  Chest xray today  Flu shot today .  GERD diet along with aspiration precautions as discussed.  Mucinex Twice daily  As needed  Cough/congestion .  Delsym 2 tsp Twice daily  As needed  Cough. Labs today .   Follow up with Dr. Alva  Or Parrett with PFT in 1-2 weeks and As needed   Please contact office for sooner follow up if symptoms do not improve or worsen or seek emergency care        

## 2018-07-16 NOTE — Progress Notes (Signed)
_0  ID: Joan Weaver, female    DOB: [birth year], 77 y.o.   MRN: [id number]  No chief complaint on file.   Referring provider: [name], MD  HPI: 77 year old female never smoker for recurrent episodes of pneumonitis in different lung zones favor recurrent aspiration due to esophageal dysmotility rather than BOOP . Medical history significant for breast cancer (2011) status post lumpectomy followed by XRT, history of Nissen fundoplication 5053  TEST /Events  She presented with pneumonia since Feb '12 not responding to antibiotics.  Transbronchial biopsy 12/2010 neg for malignant cells.  -neg for BOOP, complicated by pneumothorax . ? Radiation Pnuemonitis with elevated ESR .  On steroids on & off since 3/20 12 Serial CT chest 12/2010, 07/2011 showed Multiple shifting areas of consolidation in both lungs. Areas of consolidation seen on the prior chest CT have resolved and there are new, similar appearing areas of predominately peripheral consolidation. 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 Severe esophageal dysmotility.   EGD 03/2012 - Tortuous in the distal esophagus without evidence for stricture. Empiric dilation done- Barrett's, possible at the gastroesophageal junction  Esophageal manometry 03/2013 ([facility]) - LES pressure normal, failed peristalsis ?  PFTs - 10/2015 -normal PFT .  no obs, TLC 71%, DLCO nml Rheumatology referral 2017 neg workup , found to have osteoarthritis .    07/16/2018 Follow up: Cough Joan Weaver  Joan Weaver returns for a follow up office visit. She was last seen in 2017 . Previously seen in past with recurrent pneumonitis felt to be possible component of recurrent aspiration vs radiation pneumonitis . She was steroid responsive with clinical improvement . She was tapered off steroids in 2017 . Says she had  done okay with breathing until this past year as she has noticed she gets winded with walking , heavy activity and going up an incline . Had recent acute bronchitis few weeks ago, cough and congestion are improved after antibiotics but still has dyspnea.  Previous PFT in 2017 was normal .   She had a knee injury 09/2016 that required extensive PT /Rehab, says she has went down hill after that. Mobility is slowly returning but she has general weakness, uses a cane.   Does have chronic UTI , on daily macrodantin .     Allergies  Allergen Reactions  . Advair Diskus [Fluticasone-Salmeterol]     Nervous and shakey  . Aspirin     If she takes too much, it burns her stomach  . Bactrim [Sulfamethoxazole-Trimethoprim] Nausea Only  . Demerol Nausea Only  . Meperidine Nausea Only    Sick on Stomach  . Qvar [Beclomethasone] Other (See Comments)    Headache    Immunization History  Administered Date(s) Administered  . Influenza Split 07/26/2011  . Influenza Whole 08/07/2010, 08/09/2012  . Influenza, High Dose Seasonal PF 09/24/2015, 07/06/2016, 07/16/2018  . Influenza,inj,Quad PF,6+ Mos 07/17/2013, 09/18/2014  . Influenza-Unspecified 08/28/2017  . Pneumococcal Conjugate-13 11/06/2013  . Pneumococcal Polysaccharide-23 11/09/2011    Past Medical History:  Diagnosis Date  . Arthritis    knees, hips  . Breast cancer [facility]) August 2011   left - lumpectomy  . Cataract   . GERD (gastroesophageal reflux disease)    minor - tx with zantac  . H/O bladder infections   . Headache(784.0)    otc med prn  . Heart murmur  slight murmur -never had any problems  . Hernia   . Hypertension    no meds x 3 yrs  . Hypothyroidism   . Osteoporosis   . 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 Thyroid disease   . Ulcer   . Urinary incontinence     Tobacco History: Social History   Tobacco Use  Smoking Status Never Smoker  Smokeless Tobacco Never Used    Counseling given: Not Answered   Outpatient Medications Prior to Visit  Medication Sig Dispense Refill  . acetaminophen (TYLENOL) 500 MG tablet Take 500 mg by mouth every 6 (six) hours as needed for pain.     Marland Kitchen amLODipine (NORVASC) 10 MG tablet TAKE 1 TABLET BY MOUTH ONCE DAILY 90 tablet 3  . BIOTIN FORTE PO Take 1 tablet by mouth daily.    Marland Kitchen CALCIUM PO Take 1 tablet by mouth daily.     . Cholecalciferol (VITAMIN D) 1000 UNITS capsule Take 1,000 Units by mouth daily.     . cimetidine (TAGAMET) 200 MG tablet Take 200 mg by mouth 2 (two) times daily.    . Cranberry 500 MG CAPS Take 2 capsules by mouth daily.     Marland Kitchen etodolac (LODINE) 500 MG tablet TAKE 1 TABLET BY MOUTH TWICE DAILY 90 tablet 3  . fish oil-omega-3 fatty acids 1000 MG capsule Take 2 g by mouth daily.     Marland Kitchen glucosamine-chondroitin 500-400 MG tablet Take 2 tablets by mouth daily.     Marland Kitchen levothyroxine (SYNTHROID, LEVOTHROID) 25 MCG tablet TAKE ONE TABLET BY MOUTH ONCE DAILY 90 tablet 3  . loratadine (CLARITIN) 10 MG tablet Take 10 mg by mouth daily.      . meclizine (ANTIVERT) 25 MG tablet Take 1 tablet (25 mg total) by mouth 3 (three) times daily as needed for dizziness. 30 tablet 1  . metoprolol succinate (TOPROL-XL) 25 MG 24 hr tablet TAKE 1 TABLET BY MOUTH ONCE DAILY 90 tablet 3  . Multiple Vitamin (MULTIVITAMIN) capsule Take 1 capsule by mouth daily.      . nitrofurantoin (MACRODANTIN) 100 MG capsule Take 100 mg by mouth daily.  11  . Polyvinyl Alcohol-Povidone (CLEAR EYES ALL SEASONS OP) Apply 1 drop to eye daily as needed (dry eyes).    . Probiotic Product (ALIGN PO) Take by mouth.    . pyridOXINE (VITAMIN B-6) 100 MG tablet Take 100 mg by mouth daily.      . raloxifene (EVISTA) 60 MG tablet Take 1 tablet (60 mg total) by mouth daily. 30 tablet 5  . cholestyramine (QUESTRAN) 4 g packet Take 1 packet (4 g total) by mouth 2 (two) times daily. (Patient not taking: Reported on 07/16/2018) 60 each 12  . ondansetron (ZOFRAN) 4 MG  tablet Take 1 tablet (4 mg total) by mouth every 8 (eight) hours as needed for nausea or vomiting. (Patient not taking: Reported on 07/16/2018) 20 tablet 0  . ranitidine (ZANTAC) 150 MG tablet Take 150 mg by mouth daily.      No facility-administered medications prior to visit.      Review of Systems  Constitutional:   No  weight loss, night sweats,  Fevers, chills, +fatigue, or  lassitude.  HEENT:   No headaches,  Difficulty swallowing,  Tooth/dental problems, or  Sore throat,                No sneezing, itching, ear ache,  +nasal congestion, post nasal  drip,   CV:  No chest pain,  Orthopnea, PND, swelling in lower extremities, anasarca, dizziness, palpitations, syncope.   GI  No heartburn, indigestion, abdominal pain, nausea, vomiting, diarrhea, change in bowel habits, loss of appetite, bloody stools.   Resp:    No chest wall deformity  Skin: no rash or lesions.  GU: no dysuria, change in color of urine, no urgency or frequency.  No flank pain, no hematuria   MS:  No joint pain or swelling.  No decreased range of motion.  No back pain.    Physical Exam  BP 120/70 (BP Location: Left Arm, Cuff Size: Normal)   Pulse 79   Ht '5\' 1"'$  (1.549 m)   Wt 202 lb (91.6 kg)   SpO2 100%   BMI 38.17 kg/m   GEN: A/Ox3; pleasant , NAD, elderly , obese , cane    HEENT:  [place]/AT,  EACs-clear, TMs-wnl, NOSE-clear, THROAT-clear, no lesions, no postnasal drip or exudate noted.   NECK:  Supple w/ fair ROM; no JVD; normal carotid impulses w/o bruits; no thyromegaly or nodules palpated; no lymphadenopathy.    RESP  Clear  P & A; w/o, wheezes/ rales/ or rhonchi. no accessory muscle use, no dullness to percussion  CARD:  RRR, no m/r/g, tr peripheral edema, pulses intact, no cyanosis or clubbing.  GI:   Soft & nt; nml bowel sounds; no organomegaly or masses detected.   Musco: Warm bil, no deformities or joint swelling noted.   Neuro: alert, no focal deficits noted.    Skin: Warm, no lesions or  rashes    Lab Results:   BMET  BNP No results found for: BNP  ProBNP  Imaging: Dg Chest 2 View  Result Date: 07/16/2018 CLINICAL DATA:  Chronic persistent cough, wheezing and fatigue. Current history of hypertension. Personal history of breast cancer. EXAM: CHEST - 2 VIEW COMPARISON:  09/20/2016, 09/24/2015 and earlier. FINDINGS: Cardiac silhouette normal in size, unchanged. Thoracic aorta mildly tortuous and atherosclerotic, unchanged. Hilar and mediastinal contours otherwise unremarkable. Scarring in the lingula. Calcified granuloma in the lingula, unchanged. Mildly prominent bronchovascular markings diffusely and mild central peribronchial thickening, unchanged. No confluent airspace consolidation. No pleural effusions. Degenerative changes involving the thoracic spine IMPRESSION: Mild changes of bronchitis and/or asthma without focal airspace pneumonia. No acute cardiopulmonary disease otherwise. Electronically Signed   By: Evangeline Dakin M.D.   On: 07/16/2018 12:57     Assessment & Plan:   Acute bronchitis Recent flare seems to be resolving after recent abx.  Has lingering cough and dyspnea (seems chronic as well)  Also on daily macrodantin  Check CXR , ESR and BNP .  Pending CXR results , consider HRCT chest  PFT on return   Plan  Patient Instructions  Chest xray today  Flu shot today .  GERD diet along with aspiration precautions as discussed.  Mucinex Twice daily  As needed  Cough/congestion .  Delsym 2 tsp Twice daily  As needed  Cough. Labs today .   Follow up with Dr. Elsworth Soho  Or Tracie Lindbloom with PFT in 1-2 weeks and As needed   Please contact office for sooner follow up if symptoms do not improve or worsen or seek emergency care         Esophageal dysmotility Continue on GERD diet . Aspiration precautions    Pneumonitis 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  She  continues to have  ongoing dyspnea with activity ? Etiology . Previous PFT was normal with nml DLCO as well.  Will repeat PFT , check CXR . Also on macrodantin  Check ESR (was elevated with last pneumonitis ) .   Plan  Patient Instructions  Chest xray today  Flu shot today .  GERD diet along with aspiration precautions as discussed.  Mucinex Twice daily  As needed  Cough/congestion .  Delsym 2 tsp Twice daily  As needed  Cough. Labs today .   Follow up with Dr. Elsworth Soho  Or Soha Thorup with PFT in 1-2 weeks and As needed   Please contact office for sooner follow up if symptoms do not improve or worsen or seek emergency care            Rexene Edison, NP 07/16/2018

## 2018-07-18 ENCOUNTER — Other Ambulatory Visit: Payer: Self-pay | Admitting: Adult Health

## 2018-07-18 DIAGNOSIS — R05 Cough: Secondary | ICD-10-CM

## 2018-07-18 DIAGNOSIS — R059 Cough, unspecified: Secondary | ICD-10-CM

## 2018-07-18 DIAGNOSIS — J189 Pneumonia, unspecified organism: Secondary | ICD-10-CM

## 2018-07-23 ENCOUNTER — Ambulatory Visit: Payer: Medicare Other | Admitting: Adult Health

## 2018-07-26 ENCOUNTER — Ambulatory Visit (INDEPENDENT_AMBULATORY_CARE_PROVIDER_SITE_OTHER)
Admission: RE | Admit: 2018-07-26 | Discharge: 2018-07-26 | Disposition: A | Discharge status: Home / Self Care | Payer: Medicare Other | Source: Ambulatory Visit | Attending: Adult Health | Admitting: Adult Health

## 2018-07-26 DIAGNOSIS — J189 Pneumonia, unspecified organism: Secondary | ICD-10-CM | POA: Diagnosis not present

## 2018-07-26 DIAGNOSIS — R05 Cough: Secondary | ICD-10-CM | POA: Diagnosis not present

## 2018-07-26 DIAGNOSIS — R059 Cough, unspecified: Secondary | ICD-10-CM

## 2018-07-31 ENCOUNTER — Other Ambulatory Visit: Payer: Medicare Other

## 2018-08-05 ENCOUNTER — Other Ambulatory Visit: Payer: Self-pay | Admitting: Family Medicine

## 2018-08-07 DIAGNOSIS — R300 Dysuria: Secondary | ICD-10-CM | POA: Diagnosis not present

## 2018-08-20 ENCOUNTER — Other Ambulatory Visit: Payer: Self-pay | Admitting: Family Medicine

## 2018-09-19 ENCOUNTER — Ambulatory Visit: Payer: Medicare Other | Admitting: Pulmonary Disease

## 2018-09-19 ENCOUNTER — Encounter: Payer: Self-pay | Admitting: Pulmonary Disease

## 2018-09-19 DIAGNOSIS — R05 Cough: Secondary | ICD-10-CM

## 2018-09-19 DIAGNOSIS — J984 Other disorders of lung: Secondary | ICD-10-CM

## 2018-09-19 DIAGNOSIS — R053 Chronic cough: Secondary | ICD-10-CM

## 2018-09-19 NOTE — Patient Instructions (Signed)
Lungs look okay on scan. Okay to start an exercise program to improve conditioning. Continue Tagamet for reflux  stop nitrofurantoin

## 2018-09-19 NOTE — Progress Notes (Signed)
   Subjective:    Patient ID: Joan Weaver, female    DOB: 1941/09/12, 77 y.o.   MRN: [id number]  HPI  77 yo never smoker for FU of shortness of breath and chronic cough  H/o recurrent  pneumonitis in different lung zones - favor recurrent aspiration due to esophageal dysmotility vs radiation pneumonitis PMH - breast CA in July '11, underwent lumpectomy + RT , last nov 2011. Nissen's fundoplication in 7262  She was lost to follow-up for 2 years and return 07/2018 complaining of shortness of breath and a chronic cough.  Cough never went away. HRCT 07/2018 did not show any evidence of ILD, showed lower esophageal dilatation  She admits to sedentary lifestyle. Nitrofurantoin has now been stopped by her urologist and changed to Keflex.  She remains on Tagamet for reflux  Significant tests/ events reviewed  Transbronchial biopsy 12/2010 neg for malignant cells.  -neg for BOOP, complicated by pneumothorax . ? Radiation Pnuemonitis with elevated ESR .   Serial CT chest 12/2010, 07/2011 showed Multiple shifting areas of consolidation in both lungs. Areas of consolidation seen on the prior chest CT have resolved and there are new, similar appearing areas of predominately peripheral consolidation. 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 Severe esophageal dysmotility.   EGD 03/2012 - Tortuous in the distal esophagus without evidence for stricture. Empiric dilation done- Barrett's, possible at the gastroesophageal junction  Esophageal manometry 03/2013 ([facility]) - LES pressure normal, failed peristalsis ?  PFTs - 10/2015 -normal PFT .  no obs, TLC 71%, DLCO nml Rheumatology referral 2017 neg workup , found to have osteoarthritis .   Review of Systems neg for any significant sore throat, dysphagia, itching, sneezing, nasal congestion or excess/ purulent  secretions, fever, chills, sweats, unintended wt loss, pleuritic or exertional cp, hempoptysis, orthopnea pnd or change in chronic leg swelling. Also denies presyncope, palpitations, heartburn, abdominal pain, nausea, vomiting, diarrhea or change in bowel or urinary habits, dysuria,hematuria, rash, arthralgias, visual complaints, headache, numbness weakness or ataxia.     Objective:   Physical Exam   Gen. Pleasant, obese, in no distress ENT - no lesions, no post nasal drip Neck: No JVD, no thyromegaly, no carotid bruits Lungs: no use of accessory muscles, no dullness to percussion, decreased without rales or rhonchi  Cardiovascular: Rhythm regular, heart sounds  normal, no murmurs or gallops, no peripheral edema Musculoskeletal: No deformities, no cyanosis or clubbing , no tremors         Assessment & Plan:

## 2018-09-19 NOTE — Assessment & Plan Note (Addendum)
Symptoms now mainly related to deconditioning and severe reflux, no evidence of ILD  Okay to start an exercise program to improve conditioning.

## 2018-09-19 NOTE — Assessment & Plan Note (Signed)
Related to reflux/esophageal dysmotility  Continue Tagamet for reflux  stop nitrofurantoin

## 2018-09-20 ENCOUNTER — Other Ambulatory Visit: Payer: Self-pay | Admitting: Family Medicine

## 2018-09-27 DIAGNOSIS — H26493 Other secondary cataract, bilateral: Secondary | ICD-10-CM | POA: Diagnosis not present

## 2018-09-27 DIAGNOSIS — R51 Headache: Secondary | ICD-10-CM | POA: Diagnosis not present

## 2018-09-27 DIAGNOSIS — H04123 Dry eye syndrome of bilateral lacrimal glands: Secondary | ICD-10-CM | POA: Diagnosis not present

## 2018-09-30 DIAGNOSIS — Z853 Personal history of malignant neoplasm of breast: Secondary | ICD-10-CM | POA: Diagnosis not present

## 2018-09-30 DIAGNOSIS — Z1231 Encounter for screening mammogram for malignant neoplasm of breast: Secondary | ICD-10-CM | POA: Diagnosis not present

## 2018-09-30 LAB — HM MAMMOGRAPHY

## 2018-10-10 ENCOUNTER — Encounter: Payer: Self-pay | Admitting: *Deleted

## 2018-10-14 NOTE — Progress Notes (Signed)
[facility] report received. Sent to scan.

## 2018-10-23 ENCOUNTER — Other Ambulatory Visit: Payer: Self-pay | Admitting: Family Medicine

## 2018-10-29 DIAGNOSIS — H04123 Dry eye syndrome of bilateral lacrimal glands: Secondary | ICD-10-CM | POA: Diagnosis not present

## 2018-12-27 ENCOUNTER — Other Ambulatory Visit: Payer: Self-pay | Admitting: Family Medicine

## 2018-12-27 DIAGNOSIS — I10 Essential (primary) hypertension: Secondary | ICD-10-CM

## 2019-02-28 ENCOUNTER — Other Ambulatory Visit: Payer: Self-pay | Admitting: Family Medicine

## 2019-03-11 ENCOUNTER — Telehealth: Payer: Self-pay | Admitting: Adult Health

## 2019-03-11 NOTE — Telephone Encounter (Signed)
Moved 6/19 to 6/17 per sch msg. Called and left msg for patient.

## 2019-03-20 ENCOUNTER — Telehealth: Payer: Self-pay | Admitting: Adult Health

## 2019-03-20 NOTE — Telephone Encounter (Signed)
Called patient regarding upcoming [facility] appointment, per patient's request this will be a telephone visit due to her computer not working.

## 2019-03-25 ENCOUNTER — Telehealth: Payer: Self-pay | Admitting: Adult Health

## 2019-03-26 ENCOUNTER — Encounter: Payer: Self-pay | Admitting: Adult Health

## 2019-03-26 ENCOUNTER — Inpatient Hospital Stay: Payer: Medicare Other | Admitting: Adult Health

## 2019-03-26 ENCOUNTER — Inpatient Hospital Stay: Payer: Medicare Other | Attending: Adult Health | Admitting: Adult Health

## 2019-03-26 DIAGNOSIS — Z170 Estrogen receptor positive status [ER+]: Secondary | ICD-10-CM

## 2019-03-26 DIAGNOSIS — Z79810 Long term (current) use of selective estrogen receptor modulators (SERMs): Secondary | ICD-10-CM

## 2019-03-26 DIAGNOSIS — C50512 Malignant neoplasm of lower-outer quadrant of left female breast: Secondary | ICD-10-CM | POA: Diagnosis not present

## 2019-03-26 NOTE — Progress Notes (Signed)
SURVIVORSHIP VIRTUAL VISIT:  I connected with Joan Weaver on 03/26/19 at 12:30 PM EDT by telephone and verified that I am speaking with the correct person using two identifiers.   I discussed the limitations, risks, security and privacy concerns of performing an evaluation and management service by telephone and the availability of in person appointments. I also discussed with the patient that there may be a patient responsible charge related to this service. The patient expressed understanding and agreed to proceed.     REASON FOR VISIT:  Routine follow-up for history of breast cancer.   BRIEF ONCOLOGIC HISTORY:  Oncology History  Breast cancer, left ([place])  05/11/2010 Surgery   Left breast lumpectomy: Invasive ductal carcinoma 6 mm, grade 2, margins negative, 0/2 sentinel nodes, ER 98%, PR 97%, HER-2 negative, Ki-67 18%, T1b N0 M0 stage IA   05/30/2010 - 07/06/2010 Radiation Therapy   Adjuvant radiation by Dr. Lisbeth Renshaw   07/08/2010 - 12/29/2015 Anti-estrogen oral therapy   Tamoxifen 20 mg daily      INTERVAL HISTORY:  [name] presents to the [facility] today for routine follow-up for her history of breast cancer.  Overall, she reports feeling quite well.   Since her last visit, she underwent mammogram in 09/2018 that showed breast density category B.  There was no evidence of malignancy.     Joan Weaver has had no significant health changes this year.  She sees her PCP regularly.  She is keeping appropriate COVID precautions.  She notes that her last colon cancer screen was 9 years ago.  She says she doesn't have any skin issues, but hasn't had a skin check recently.    Joan Weaver is not exercising, because her arthritis does make it challenging.  She walks with a walker in her home.  She notes she has gotten out a couple of times to pick up to go food.     REVIEW OF SYSTEMS:  Review of Systems  Constitutional: Negative for appetite change, chills, fatigue and unexpected weight  change.  HENT:   Negative for hearing loss, mouth sores, sore throat and trouble swallowing.   Eyes: Negative for eye problems and icterus.  Respiratory: Negative for chest tightness, cough and shortness of breath.   Cardiovascular: Negative for chest pain, leg swelling and palpitations.  Gastrointestinal: Negative for abdominal distention, abdominal pain, constipation, diarrhea, nausea and vomiting.  Endocrine: Negative for hot flashes.  Genitourinary: Negative for difficulty urinating.   Musculoskeletal: Positive for arthralgias.  Skin: Negative for itching and rash.  Neurological: Negative for dizziness, extremity weakness, headaches and numbness.  Hematological: Negative for adenopathy.  Psychiatric/Behavioral: Negative for depression. The patient is not nervous/anxious.   Breast: Denies any new nodularity, masses, tenderness, nipple changes, or nipple discharge.    PAST MEDICAL/SURGICAL HISTORY:  Past Medical History:  Diagnosis Date  . Arthritis    knees, hips  . Breast cancer [facility]) August 2011   left - lumpectomy  . Cataract   . GERD (gastroesophageal reflux disease)    minor - tx with zantac  . H/O bladder infections   . Headache(784.0)    otc med prn  . Heart murmur    slight murmur -never had any problems  . Hernia   . Hypertension    no meds x 3 yrs  . Hypothyroidism   . Osteoporosis   . 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 Thyroid disease   . Ulcer   .  Urinary incontinence    Past Surgical History:  Procedure Laterality Date  . APPENDECTOMY    . BREAST LUMPECTOMY     left  . EYE SURGERY     left- cataract   . HIATAL HERNIA REPAIR     x 2  . HYSTEROSCOPY W/D&C N/A 07/07/2013   Procedure: DILATATION AND CURETTAGE /HYSTEROSCOPY;  Surgeon: Anastasio Auerbach, MD;  Location: [facility];  Service: Gynecology;  Laterality: N/A;  . KNEE SURGERY     Arthroscopic  . left cataract surgery    . TONSILLECTOMY        ALLERGIES:  Allergies  Allergen Reactions  . Advair Diskus [Fluticasone-Salmeterol]     Nervous and shakey  . Aspirin     If she takes too much, it burns her stomach  . Bactrim [Sulfamethoxazole-Trimethoprim] Nausea Only  . Demerol Nausea Only  . Meperidine Nausea Only    Sick on Stomach  . Qvar [Beclomethasone] Other (See Comments)    Headache     CURRENT MEDICATIONS:  Outpatient Encounter Medications as of 03/26/2019  Medication Sig  . acetaminophen (TYLENOL) 500 MG tablet Take 500 mg by mouth every 6 (six) hours as needed for pain.   Marland Kitchen amLODipine (NORVASC) 10 MG tablet Take 1 tablet by mouth once daily  . BIOTIN FORTE PO Take 1 tablet by mouth daily.  Marland Kitchen CALCIUM PO Take 1 tablet by mouth daily.   . cephALEXin (KEFLEX) 250 MG capsule Take 250 mg by mouth daily.  . Cholecalciferol (VITAMIN D) 1000 UNITS capsule Take 1,000 Units by mouth daily.   . cimetidine (TAGAMET) 200 MG tablet Take 200 mg by mouth 2 (two) times daily.  . Cranberry 500 MG CAPS Take 2 capsules by mouth daily.   Marland Kitchen etodolac (LODINE) 500 MG tablet Take 1 tablet by mouth twice daily  . fish oil-omega-3 fatty acids 1000 MG capsule Take 2 g by mouth daily.   Marland Kitchen glucosamine-chondroitin 500-400 MG tablet Take 2 tablets by mouth daily.   Marland Kitchen levothyroxine (SYNTHROID, LEVOTHROID) 25 MCG tablet TAKE 1 TABLET BY MOUTH ONCE DAILY  . loratadine (CLARITIN) 10 MG tablet Take 10 mg by mouth daily.    . meclizine (ANTIVERT) 25 MG tablet Take 1 tablet (25 mg total) by mouth 3 (three) times daily as needed for dizziness.  . metoprolol succinate (TOPROL-XL) 25 MG 24 hr tablet TAKE 1 TABLET BY MOUTH ONCE DAILY  . Multiple Vitamin (MULTIVITAMIN) capsule Take 1 capsule by mouth daily.    . Polyvinyl Alcohol-Povidone (CLEAR EYES ALL SEASONS OP) Apply 1 drop to eye daily as needed (dry eyes).  . Probiotic Product (ALIGN PO) Take by mouth.  . pyridOXINE (VITAMIN B-6) 100 MG tablet Take 100 mg by mouth daily.    . raloxifene (EVISTA) 60 MG  tablet TAKE 1 TABLET BY MOUTH ONCE DAILY   No facility-administered encounter medications on file as of 03/26/2019.      ONCOLOGIC FAMILY HISTORY:  Family History  Problem Relation Age of Onset  . Prostate cancer Maternal Grandfather   . Cancer Paternal Grandfather        Stomach cancer  . Rheum arthritis Mother   . Heart disease Maternal Grandmother   . Breast cancer Maternal Aunt 70    GENETIC COUNSELING/TESTING: Not at this time  SOCIAL HISTORY:  Social History   Socioeconomic History  . Marital status: Widowed    Spouse name: Not on file  . Number of children: 0  . Years of education: Not on  file  . Highest education level: Not on file  Occupational History  . Occupation: Retired     Comment: Producer, television/film/video  . Financial resource strain: Not on file  . Food insecurity    Worry: Not on file    Inability: Not on file  . Transportation needs    Medical: Not on file    Non-medical: Not on file  Tobacco Use  . Smoking status: Never Smoker  . Smokeless tobacco: Never Used  Substance and Sexual Activity  . Alcohol use: No  . Drug use: No  . Sexual activity: Not Currently    Birth control/protection: Post-menopausal  Lifestyle  . Physical activity    Days per week: Not on file    Minutes per session: Not on file  . Stress: Not on file  Relationships  . Social Herbalist on phone: Not on file    Gets together: Not on file    Attends religious service: Not on file    Active member of club or organization: Not on file    Attends meetings of clubs or organizations: Not on file    Relationship status: Not on file  . Intimate partner violence    Fear of current or ex partner: Not on file    Emotionally abused: Not on file    Physically abused: Not on file    Forced sexual activity: Not on file  Other Topics Concern  . Not on file  Social History Narrative  . Not on file      OBJECTIVE:  Patient is doing well.  No apparent distress.   Speech is non pressured.  Breathing is non labored.  Mood and behavior are normal.    LABORATORY DATA:  None for this visit   DIAGNOSTIC IMAGING:  Most recent mammogram:     ASSESSMENT AND PLAN:  Ms.. Lawley is a pleasant 78 y.o. female with history of Stage IA left breast invasive ductal carcinoma, ER+/PR+/HER2-, diagnosed in 05/2010, treated with lumpectomy, adjuvant radiation therapy, and anti-estrogen therapy with Tamoxifen x 6 years completed in 2017.  [facility] and routine follow-up.   1. History of breast cancer:  Joan Weaver is currently clinically and radiographically without evidence of disease or recurrence of breast cancer. She will be due for mammogram in 09/2019.   She will return to see me in one year for LTS follow up.  I encouraged her to call me with any questions or concerns before her next visit at the cancer center, and I would be happy to see her sooner, if needed.    2. Bone health:  Given Ms. Obar's age, history of breast cancer, she is at risk for bone demineralization. Her last DEXA scan was on 04/23/2019 and demonstrated worsening osteoporosis.  She was recommended to restart bisphosphanate therapy.  She is taking Raloxifene daily and is tolerating it well.  In the meantime, she was encouraged to increase her consumption of foods rich in calcium, as well as increase her weight-bearing activities.  She was given education on specific food and activities to promote bone health.  3. Cancer screening:  Due to Ms. Loewenstein's history and her age, she should receive screening for skin cancers, colon cancer. She was encouraged to follow-up with her PCP for appropriate cancer screenings.   4. Health maintenance and wellness promotion: Ms. Boline was encouraged to consume 5-7 servings of fruits and vegetables per day. She was also  encouraged to engage in moderate to vigorous exercise for 30 minutes per day most days of the week. She was  instructed to limit her alcohol consumption and continue to abstain from tobacco use.      Follow up instructions:    -Return to cancer center in in 1 year  -Mammogram due in 09/2019   The patient was provided an opportunity to ask questions and all were answered. The patient agreed with the plan and demonstrated an understanding of the instructions.   The patient was advised to call back or seek an in-person evaluation if the symptoms worsen or if the condition fails to improve as anticipated.   I provided 18 minutes of non face-to-face telephone visit time during this encounter, and > 50% was spent counseling as documented under my assessment & plan.   Gardenia Phlegm, NP Survivorship Program [facility] [phone]   Note: PRIMARY CARE PROVIDER [name], [place] [phone]

## 2019-03-28 ENCOUNTER — Encounter: Payer: Medicare Other | Admitting: Adult Health

## 2019-03-28 ENCOUNTER — Telehealth: Payer: Self-pay | Admitting: Adult Health

## 2019-03-28 NOTE — Telephone Encounter (Signed)
I talk with patient regarding schedule  

## 2019-04-18 ENCOUNTER — Other Ambulatory Visit: Payer: Self-pay | Admitting: Family Medicine

## 2019-05-02 ENCOUNTER — Other Ambulatory Visit: Payer: Self-pay | Admitting: Family Medicine

## 2019-05-02 DIAGNOSIS — I10 Essential (primary) hypertension: Secondary | ICD-10-CM

## 2019-05-05 IMAGING — DX DG CHEST 2V
2 series · 2 of 2 positions shown · non-contrast
Comparison: 09/20/2016, 09/24/2015 and earlier.

CLINICAL DATA: Chronic persistent cough, wheezing and fatigue.
Current history of hypertension. Personal history of breast cancer.

EXAM:
CHEST - 2 VIEW

[chest pa]
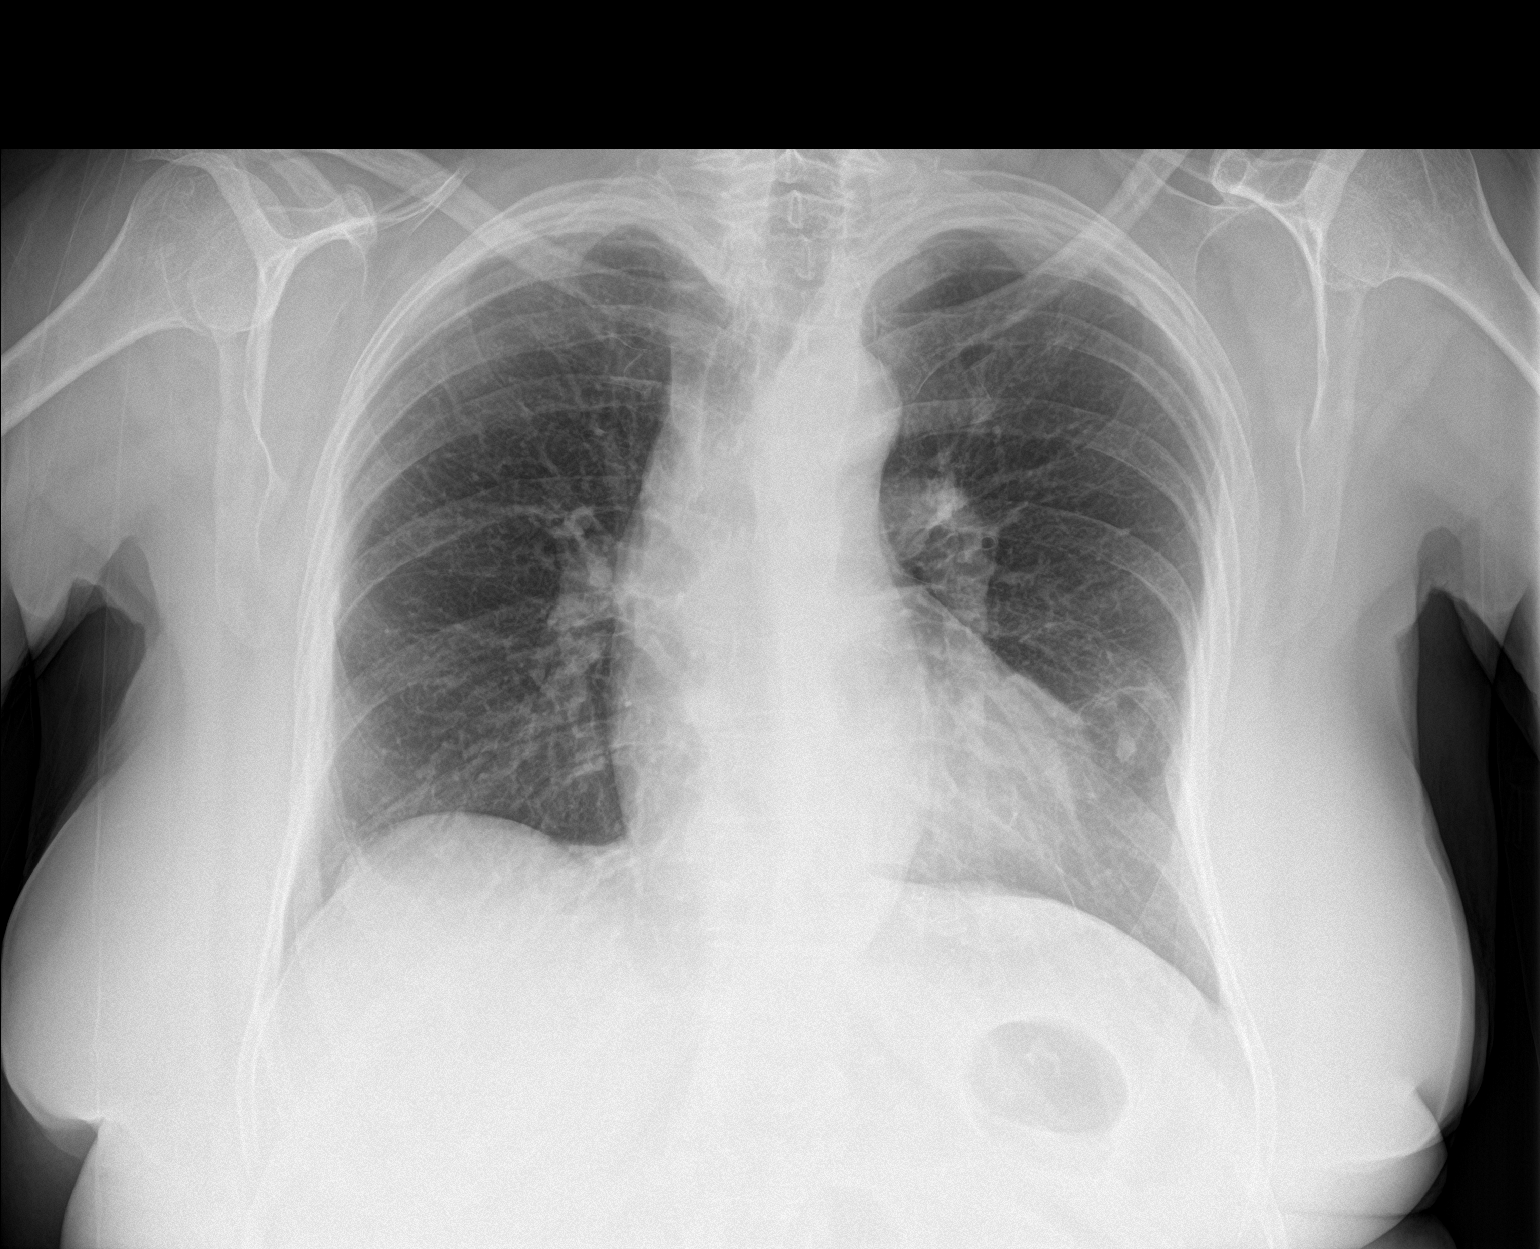

[chest lat]
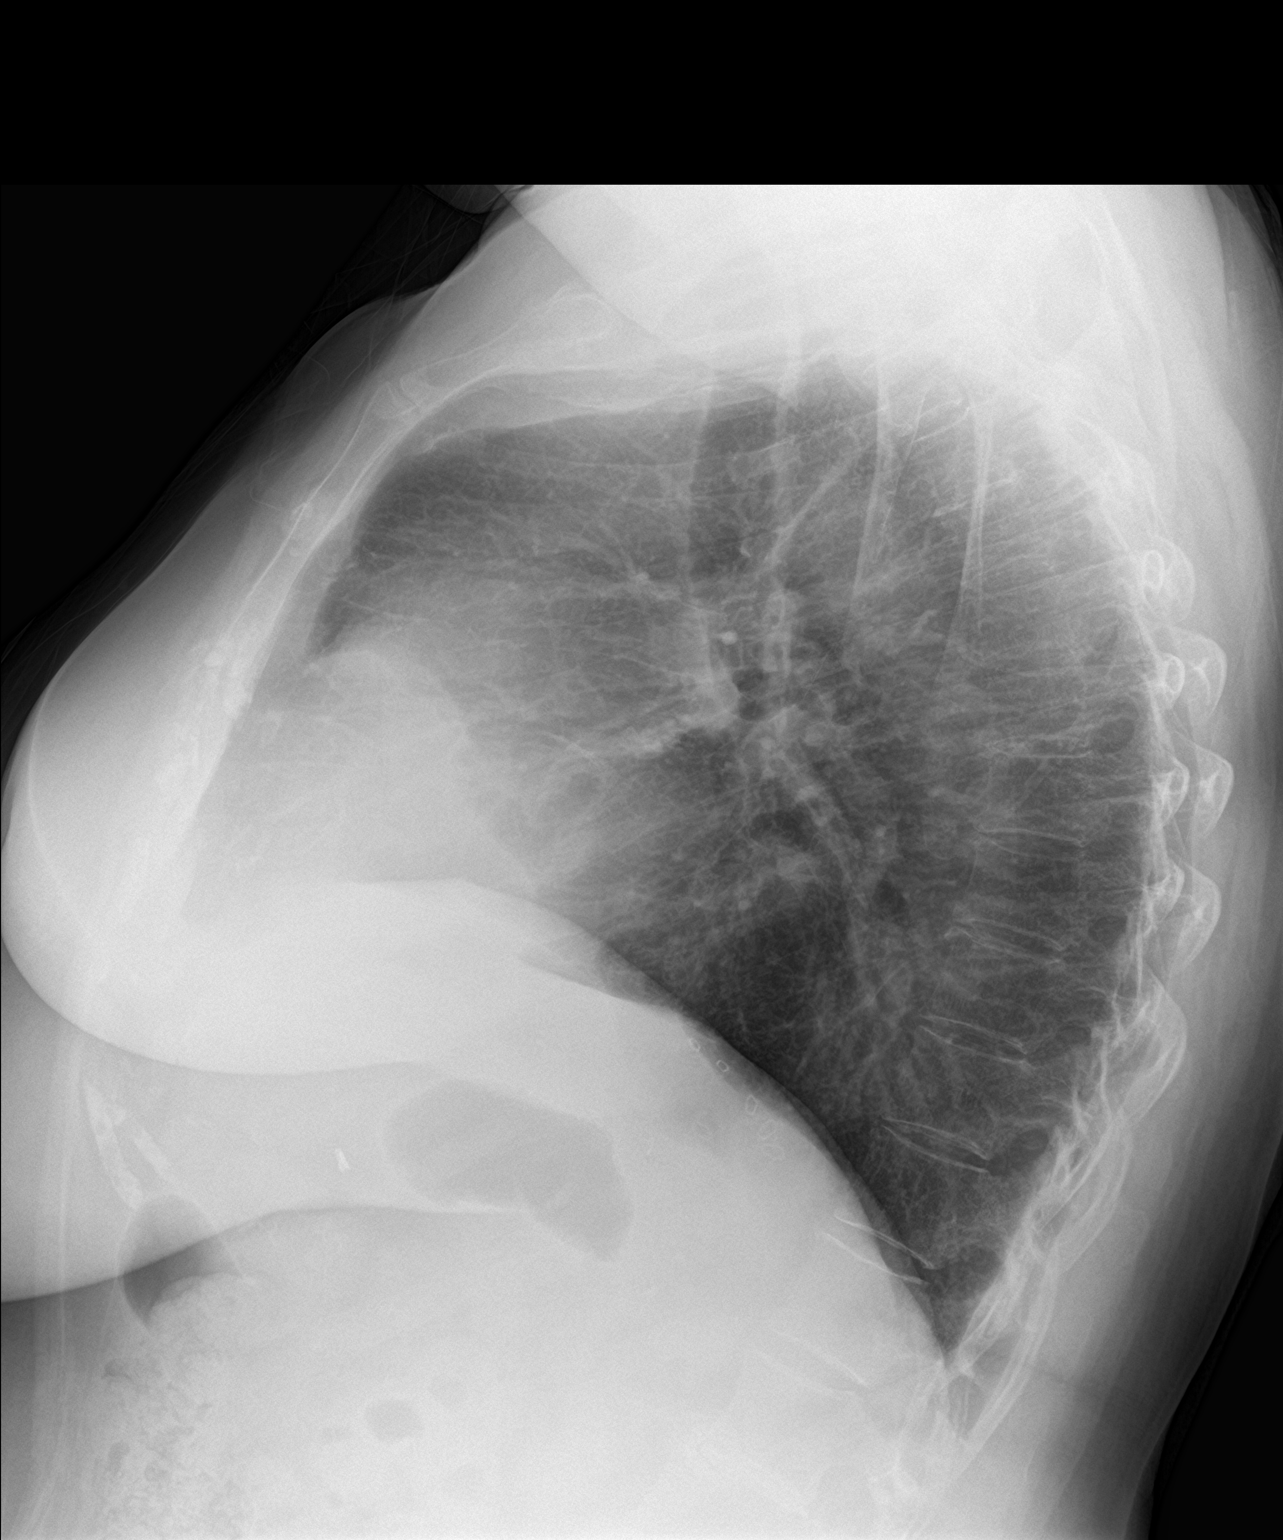

[2 of 2 positions shown; findings below may reference images not displayed]

FINDINGS: Cardiac silhouette normal in size, unchanged. Thoracic aorta mildly
tortuous and atherosclerotic, unchanged. Hilar and mediastinal
contours otherwise unremarkable. Scarring in the lingula. Calcified
granuloma in the lingula, unchanged. Mildly prominent
bronchovascular markings diffusely and mild central peribronchial
thickening, unchanged. No confluent airspace consolidation. No
pleural effusions. Degenerative changes involving the thoracic spine
IMPRESSION: Mild changes of bronchitis and/or asthma without focal airspace
pneumonia. No acute cardiopulmonary disease otherwise.

## 2019-06-03 ENCOUNTER — Ambulatory Visit (INDEPENDENT_AMBULATORY_CARE_PROVIDER_SITE_OTHER): Payer: Medicare Other | Admitting: Family Medicine

## 2019-06-03 ENCOUNTER — Encounter: Payer: Self-pay | Admitting: Family Medicine

## 2019-06-03 ENCOUNTER — Other Ambulatory Visit: Payer: Self-pay

## 2019-06-03 VITALS — BP 126/70 | HR 88 | Temp 98.8°F | Resp 16 | Ht 61.0 in | Wt 198.0 lb

## 2019-06-03 DIAGNOSIS — E039 Hypothyroidism, unspecified: Secondary | ICD-10-CM

## 2019-06-03 DIAGNOSIS — I10 Essential (primary) hypertension: Secondary | ICD-10-CM | POA: Diagnosis not present

## 2019-06-03 DIAGNOSIS — K2100 Gastro-esophageal reflux disease with esophagitis, without bleeding: Secondary | ICD-10-CM

## 2019-06-03 DIAGNOSIS — Z23 Encounter for immunization: Secondary | ICD-10-CM

## 2019-06-03 DIAGNOSIS — K224 Dyskinesia of esophagus: Secondary | ICD-10-CM

## 2019-06-03 MED ORDER — AMLODIPINE BESYLATE 10 MG PO TABS: 10.0000 mg | ORAL_TABLET | Freq: Every day | ORAL | 1 refills | Status: DC

## 2019-06-03 MED ORDER — METOPROLOL SUCCINATE ER 25 MG PO TB24: 25.0000 mg | ORAL_TABLET | Freq: Every day | ORAL | 3 refills | Status: DC

## 2019-06-03 MED ORDER — LEVOTHYROXINE SODIUM 25 MCG PO TABS: 25.0000 ug | ORAL_TABLET | Freq: Every day | ORAL | 3 refills | Status: DC

## 2019-06-03 MED ORDER — PANTOPRAZOLE SODIUM 40 MG PO TBEC: 40.0000 mg | DELAYED_RELEASE_TABLET | Freq: Every day | ORAL | 5 refills | Status: DC

## 2019-06-03 MED ORDER — RALOXIFENE HCL 60 MG PO TABS: 60.0000 mg | ORAL_TABLET | Freq: Every day | ORAL | 3 refills | Status: DC

## 2019-06-03 NOTE — Addendum Note (Signed)
Addended by: Jenna Luo T on: 06/03/2019 03:02 PM   Modules accepted: Orders

## 2019-06-03 NOTE — Progress Notes (Signed)
Subjective:    Patient ID: Joan Weaver, female    DOB: 10-09-1941, 78 y.o.   MRN: [id number]  HPI Patient has not been seen since May 2019.  She is here today requesting a refill on her medications.  Patient has a history of hypertension.  She is currently taking Toprol and amlodipine for hypertension.  She denies any chest pain or shortness of breath.  She denies any angina.  However she does report chest pain.  When I evaluate the patient further she states that she awakens every morning with nausea.  The nausea last typically till 11 or 12:00.  She also has severe refractory reflux despite taking Tagamet and Gaviscon.  She is also taking lodine for arthritis every day due to the pain in her knees.  Patient had a CT scan of her chest last year that showed a recurrent hiatal hernia with also fluid in the distal esophagus suggesting underlying reflux.  Patient does have severe reflux every day and also persistent nausea.  I am concerned that her use of an NSAID and her hiatal hernia may be putting her at increased risk for peptic ulcer disease.  She is also due for a flu shot today.  She denies any other medical concerns aside from the fact she is not losing weight.  She does take levothyroxine for hypothyroidism.  She states that she eats very little but she is not seeing any successful weight loss. Past Medical History:  Diagnosis Date  . Arthritis    knees, hips  . Breast cancer [facility]) August 2011   left - lumpectomy  . Cataract   . GERD (gastroesophageal reflux disease)    minor - tx with zantac  . H/O bladder infections   . Headache(784.0)    otc med prn  . Heart murmur    slight murmur -never had any problems  . Hernia   . Hypertension    no meds x 3 yrs  . Hypothyroidism   . Osteoporosis   . 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 Thyroid disease   . Ulcer   . Urinary incontinence    Past Surgical History:  Procedure Laterality Date   . APPENDECTOMY    . BREAST LUMPECTOMY     left  . EYE SURGERY     left- cataract   . HIATAL HERNIA REPAIR     x 2  . HYSTEROSCOPY W/D&C N/A 07/07/2013   Procedure: DILATATION AND CURETTAGE /HYSTEROSCOPY;  Surgeon: Anastasio Auerbach, MD;  Location: [place] [facility];  Service: Gynecology;  Laterality: N/A;  . KNEE SURGERY     Arthroscopic  . left cataract surgery    . TONSILLECTOMY     Current Outpatient Medications on File Prior to Visit  Medication Sig Dispense Refill  . acetaminophen (TYLENOL) 500 MG tablet Take 500 mg by mouth every 6 (six) hours as needed for pain.     Marland Kitchen amLODipine (NORVASC) 10 MG tablet Take 1 tablet by mouth once daily 90 tablet 1  . BIOTIN FORTE PO Take 1 tablet by mouth daily.    Marland Kitchen CALCIUM PO Take 1 tablet by mouth daily.     . cephALEXin (KEFLEX) 250 MG capsule Take 250 mg by mouth daily.    . Cholecalciferol (VITAMIN D) 1000 UNITS capsule Take 1,000 Units by mouth daily.     . cimetidine (TAGAMET) 200 MG tablet Take 200 mg by mouth 2 (two) times  daily.    . Cranberry 500 MG CAPS Take 2 capsules by mouth daily.     Marland Kitchen etodolac (LODINE) 500 MG tablet Take 1 tablet by mouth twice daily 90 tablet 0  . fish oil-omega-3 fatty acids 1000 MG capsule Take 2 g by mouth daily.     Marland Kitchen glucosamine-chondroitin 500-400 MG tablet Take 2 tablets by mouth daily.     Marland Kitchen levothyroxine (SYNTHROID, LEVOTHROID) 25 MCG tablet TAKE 1 TABLET BY MOUTH ONCE DAILY 90 tablet 3  . loratadine (CLARITIN) 10 MG tablet Take 10 mg by mouth daily.      . meclizine (ANTIVERT) 25 MG tablet Take 1 tablet (25 mg total) by mouth 3 (three) times daily as needed for dizziness. 30 tablet 1  . metoprolol succinate (TOPROL-XL) 25 MG 24 hr tablet Take 1 tablet by mouth once daily 30 tablet 0  . Multiple Vitamin (MULTIVITAMIN) capsule Take 1 capsule by mouth daily.      . Polyvinyl Alcohol-Povidone (CLEAR EYES ALL SEASONS OP) Apply 1 drop to eye daily as needed (dry eyes).    . Probiotic Product (ALIGN PO) Take by  mouth.    . pyridOXINE (VITAMIN B-6) 100 MG tablet Take 100 mg by mouth daily.      . raloxifene (EVISTA) 60 MG tablet Take 1 tablet by mouth once daily 30 tablet 0   No current facility-administered medications on file prior to visit.       Review of Systems  All other systems reviewed and are negative.      Objective:   Physical Exam Vitals signs reviewed.  Constitutional:      General: She is not in acute distress.    Appearance: She is obese. She is not ill-appearing or toxic-appearing.  Cardiovascular:     Rate and Rhythm: Normal rate and regular rhythm.     Pulses: Normal pulses.     Heart sounds: Normal heart sounds. No murmur.  Pulmonary:     Effort: Pulmonary effort is normal. No respiratory distress.     Breath sounds: Normal breath sounds. No stridor. No wheezing, rhonchi or rales.  Abdominal:     General: Abdomen is flat. Bowel sounds are normal. There is no distension.     Palpations: Abdomen is soft.     Tenderness: There is no abdominal tenderness. There is no guarding.  Musculoskeletal:     Right lower leg: No edema.     Left lower leg: No edema.  Neurological:     Mental Status: She is alert.           Assessment & Plan:  1. Benign essential HTN Blood pressure today is well controlled.  Check CMP and fasting lipid panel. - CBC with Differential/Platelet - COMPLETE METABOLIC PANEL WITH GFR - Lipid panel  2. Hypothyroidism, unspecified type Check TSH to ensure adequate dosage of her levothyroxine. - TSH  3. Esophageal dysmotility Recommended that the patient discontinue lodine.  I am concerned that the patient is at high risk for esophagitis and even peptic ulcer disease due to NSAID overuse particularly given her hiatal hernia.  Recommend discontinuation of Tagamet and replacing with Protonix 40 mg a day and then recheck via telephone in 2 to 3 weeks.  If she insist on an NSAID for her arthritis I would recommend Celebrex is the lesser of 2 evils   4. Gastroesophageal reflux disease with esophagitis Please see #3

## 2019-06-04 LAB — LIPID PANEL
Cholesterol: 185 mg/dL (ref <200)
HDL: 65 mg/dL (ref >=50)
LDL Cholesterol (Calc): 94 mg/dL (calc)
Non-HDL Cholesterol (Calc): 120 mg/dL (calc) (ref <130)
Total CHOL/HDL Ratio: 2.8 (calc) (ref <5.0)
Triglycerides: 166 mg/dL — ABNORMAL HIGH (ref <150)

## 2019-06-04 LAB — COMPLETE METABOLIC PANEL WITH GFR
AG Ratio: 1.5 (calc) (ref 1.0–2.5)
ALT: 13 U/L (ref 6–29)
AST: 21 U/L (ref 10–35)
Albumin: 4.1 g/dL (ref 3.6–5.1)
Alkaline phosphatase (APISO): 52 U/L (ref 37–153)
BUN/Creatinine Ratio: 17 (calc) (ref 6–22)
BUN: 20 mg/dL (ref 7–25)
CO2: 25 mmol/L (ref 20–32)
Calcium: 9.4 mg/dL (ref 8.6–10.4)
Chloride: 102 mmol/L (ref 98–110)
Creat: 1.19 mg/dL — ABNORMAL HIGH (ref 0.60–0.93)
GFR, Est African American: 51 mL/min/{1.73_m2} — ABNORMAL LOW (ref >=60)
GFR, Est Non African American: 44 mL/min/{1.73_m2} — ABNORMAL LOW (ref >=60)
Globulin: 2.7 g/dL (calc) (ref 1.9–3.7)
Glucose, Bld: 98 mg/dL (ref 65–99)
Potassium: 3.9 mmol/L (ref 3.5–5.3)
Sodium: 139 mmol/L (ref 135–146)
Total Bilirubin: 0.6 mg/dL (ref 0.2–1.2)
Total Protein: 6.8 g/dL (ref 6.1–8.1)

## 2019-06-04 LAB — CBC WITH DIFFERENTIAL/PLATELET
Absolute Monocytes: 620 cells/uL (ref 200–950)
Basophils Absolute: 59 cells/uL (ref 0–200)
Basophils Relative: 0.9 %
Eosinophils Absolute: 92 cells/uL (ref 15–500)
Eosinophils Relative: 1.4 %
HCT: 42.8 % (ref 35.0–45.0)
Hemoglobin: 14.2 g/dL (ref 11.7–15.5)
Lymphs Abs: 2171 cells/uL (ref 850–3900)
MCH: 29.2 pg (ref 27.0–33.0)
MCHC: 33.2 g/dL (ref 32.0–36.0)
MCV: 88.1 fL (ref 80.0–100.0)
MPV: 11.3 fL (ref 7.5–12.5)
Monocytes Relative: 9.4 %
Neutro Abs: 3656 cells/uL (ref 1500–7800)
Neutrophils Relative %: 55.4 %
Platelets: 244 10*3/uL (ref 140–400)
RBC: 4.86 10*6/uL (ref 3.80–5.10)
RDW: 12.7 % (ref 11.0–15.0)
Total Lymphocyte: 32.9 %
WBC: 6.6 10*3/uL (ref 3.8–10.8)

## 2019-06-04 LAB — TSH: TSH: 2.85 mIU/L (ref 0.40–4.50)

## 2019-06-24 DIAGNOSIS — H04123 Dry eye syndrome of bilateral lacrimal glands: Secondary | ICD-10-CM | POA: Diagnosis not present

## 2019-06-24 DIAGNOSIS — H16223 Keratoconjunctivitis sicca, not specified as Sjogren's, bilateral: Secondary | ICD-10-CM | POA: Diagnosis not present

## 2019-09-11 DIAGNOSIS — R350 Frequency of micturition: Secondary | ICD-10-CM | POA: Diagnosis not present

## 2019-09-11 DIAGNOSIS — R300 Dysuria: Secondary | ICD-10-CM | POA: Diagnosis not present

## 2019-10-08 DIAGNOSIS — K589 Irritable bowel syndrome without diarrhea: Secondary | ICD-10-CM | POA: Diagnosis not present

## 2019-10-08 DIAGNOSIS — Z8262 Family history of osteoporosis: Secondary | ICD-10-CM | POA: Diagnosis not present

## 2019-10-08 DIAGNOSIS — M810 Age-related osteoporosis without current pathological fracture: Secondary | ICD-10-CM | POA: Diagnosis not present

## 2019-10-08 DIAGNOSIS — Z853 Personal history of malignant neoplasm of breast: Secondary | ICD-10-CM | POA: Diagnosis not present

## 2019-10-08 LAB — HM MAMMOGRAPHY

## 2019-10-15 ENCOUNTER — Ambulatory Visit: Payer: Medicare Other | Admitting: Acute Care

## 2019-10-15 ENCOUNTER — Ambulatory Visit: Payer: Medicare Other | Admitting: Pulmonary Disease

## 2019-10-23 ENCOUNTER — Ambulatory Visit: Payer: Medicare Other | Admitting: Pulmonary Disease

## 2019-10-27 ENCOUNTER — Telehealth: Payer: Self-pay | Admitting: Family Medicine

## 2019-10-27 MED ORDER — LEVOTHYROXINE SODIUM 25 MCG PO TABS: 25.0000 ug | ORAL_TABLET | Freq: Every day | ORAL | 3 refills | Status: DC

## 2019-10-27 NOTE — Telephone Encounter (Signed)
Patient left vm asking for her tyroid medication called into [facility].  [id number]

## 2019-10-27 NOTE — Telephone Encounter (Signed)
Medication called/sent to requested pharmacy  

## 2019-10-28 ENCOUNTER — Other Ambulatory Visit: Payer: Self-pay | Admitting: Family Medicine

## 2019-10-28 MED ORDER — LEVOTHYROXINE SODIUM 25 MCG PO TABS: 25.0000 ug | ORAL_TABLET | Freq: Every day | ORAL | 3 refills | Status: DC

## 2019-12-01 ENCOUNTER — Other Ambulatory Visit: Payer: Self-pay | Admitting: Family Medicine

## 2019-12-12 ENCOUNTER — Ambulatory Visit (INDEPENDENT_AMBULATORY_CARE_PROVIDER_SITE_OTHER): Payer: Medicare Other

## 2019-12-12 DIAGNOSIS — M810 Age-related osteoporosis without current pathological fracture: Secondary | ICD-10-CM

## 2019-12-12 DIAGNOSIS — E58 Dietary calcium deficiency: Secondary | ICD-10-CM

## 2019-12-12 MED ORDER — DENOSUMAB 60 MG/ML ~~LOC~~ SOSY
60.0000 mg | PREFILLED_SYRINGE | Freq: Once | SUBCUTANEOUS | Status: AC
  Administered 2019-12-12: 60 mg via SUBCUTANEOUS

## 2019-12-15 ENCOUNTER — Encounter: Payer: Self-pay | Admitting: *Deleted

## 2020-01-02 ENCOUNTER — Other Ambulatory Visit: Payer: Self-pay | Admitting: Family Medicine

## 2020-01-06 DIAGNOSIS — H5213 Myopia, bilateral: Secondary | ICD-10-CM | POA: Diagnosis not present

## 2020-03-02 ENCOUNTER — Other Ambulatory Visit: Payer: Self-pay | Admitting: Family Medicine

## 2020-03-02 DIAGNOSIS — I10 Essential (primary) hypertension: Secondary | ICD-10-CM

## 2020-03-25 ENCOUNTER — Inpatient Hospital Stay: Payer: Medicare Other | Attending: Hematology and Oncology | Admitting: Adult Health

## 2020-03-25 ENCOUNTER — Encounter: Payer: Self-pay | Admitting: Adult Health

## 2020-03-25 ENCOUNTER — Encounter: Payer: Medicare Other | Admitting: Adult Health

## 2020-03-25 ENCOUNTER — Ambulatory Visit: Payer: Medicare Other | Admitting: Hematology and Oncology

## 2020-03-25 ENCOUNTER — Other Ambulatory Visit: Payer: Self-pay

## 2020-03-25 VITALS — BP 130/58 | HR 58 | Temp 98.9°F | Resp 18 | Ht 61.0 in | Wt 191.6 lb

## 2020-03-25 DIAGNOSIS — Z923 Personal history of irradiation: Secondary | ICD-10-CM | POA: Diagnosis not present

## 2020-03-25 DIAGNOSIS — Z79899 Other long term (current) drug therapy: Secondary | ICD-10-CM | POA: Diagnosis not present

## 2020-03-25 DIAGNOSIS — E039 Hypothyroidism, unspecified: Secondary | ICD-10-CM | POA: Diagnosis not present

## 2020-03-25 DIAGNOSIS — Z8261 Family history of arthritis: Secondary | ICD-10-CM | POA: Insufficient documentation

## 2020-03-25 DIAGNOSIS — R194 Change in bowel habit: Secondary | ICD-10-CM | POA: Diagnosis not present

## 2020-03-25 DIAGNOSIS — C50512 Malignant neoplasm of lower-outer quadrant of left female breast: Secondary | ICD-10-CM | POA: Diagnosis not present

## 2020-03-25 DIAGNOSIS — Z853 Personal history of malignant neoplasm of breast: Secondary | ICD-10-CM | POA: Diagnosis not present

## 2020-03-25 DIAGNOSIS — Z800 Family history of malignant neoplasm of digestive organs: Secondary | ICD-10-CM | POA: Insufficient documentation

## 2020-03-25 DIAGNOSIS — Z8249 Family history of ischemic heart disease and other diseases of the circulatory system: Secondary | ICD-10-CM | POA: Diagnosis not present

## 2020-03-25 DIAGNOSIS — Z803 Family history of malignant neoplasm of breast: Secondary | ICD-10-CM | POA: Insufficient documentation

## 2020-03-25 DIAGNOSIS — I10 Essential (primary) hypertension: Secondary | ICD-10-CM | POA: Diagnosis not present

## 2020-03-25 DIAGNOSIS — Z8042 Family history of malignant neoplasm of prostate: Secondary | ICD-10-CM | POA: Diagnosis not present

## 2020-03-25 DIAGNOSIS — M810 Age-related osteoporosis without current pathological fracture: Secondary | ICD-10-CM | POA: Insufficient documentation

## 2020-03-25 DIAGNOSIS — Z170 Estrogen receptor positive status [ER+]: Secondary | ICD-10-CM

## 2020-03-25 NOTE — Progress Notes (Signed)
SURVIVORSHIP VIRTUAL VISIT:  I connected with Joan Weaver on 03/25/20 at  3:00 PM EDT by telephone and verified that I am speaking with the correct person using two identifiers.   I discussed the limitations, risks, security and privacy concerns of performing an evaluation and management service by telephone and the availability of in person appointments. I also discussed with the patient that there may be a patient responsible charge related to this service. The patient expressed understanding and agreed to proceed.     REASON FOR VISIT:  Routine follow-up for history of breast cancer.   BRIEF ONCOLOGIC HISTORY:  Oncology History  Breast cancer, left ([place])  05/11/2010 Surgery   Left breast lumpectomy: Invasive ductal carcinoma 6 mm, grade 2, margins negative, 0/2 sentinel nodes, ER 98%, PR 97%, HER-2 negative, Ki-67 18%, T1b N0 M0 stage IA   05/30/2010 - 07/06/2010 Radiation Therapy   Adjuvant radiation by Dr. Lisbeth Renshaw   07/08/2010 - 12/29/2015 Anti-estrogen oral therapy   Tamoxifen 20 mg daily      INTERVAL HISTORY:  Joan Weaver presents to the [facility] today for routine follow-up for her history of breast cancer.  Overall, she reports feeling quite well.   Since her last visit, she underwent mammogram in 09/2019 at [facility] that showed no evidence of malignancy and breast density category B.  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  She was started on Prolia by Dr. [name] and receives this in March and September.  She is tolerating it well.    She notes that she has bad knees, but is not yet ready to have replacements since she lives alone.  She also wonders if she should have a colonoscopy since she has been having increased diarrhea.  She says her last colonoscopy was 10 years ago with Dr. Hilarie Fredrickson.    REVIEW OF SYSTEMS:  Review of Systems  Constitutional: Negative for appetite change, chills, fatigue  and unexpected weight change.  HENT:   Negative for hearing loss, mouth sores, sore throat and trouble swallowing.   Eyes: Negative for eye problems and icterus.  Respiratory: Negative for chest tightness, cough and shortness of breath.   Cardiovascular: Negative for chest pain, leg swelling and palpitations.  Gastrointestinal: Negative for abdominal distention, abdominal pain, constipation, diarrhea, nausea and vomiting.  Endocrine: Negative for hot flashes.  Genitourinary: Negative for difficulty urinating.   Musculoskeletal: Positive for arthralgias.  Skin: Negative for itching and rash.  Neurological: Negative for dizziness, extremity weakness, headaches and numbness.  Hematological: Negative for adenopathy.  Psychiatric/Behavioral: Negative for depression. The patient is not nervous/anxious.   Breast: Denies any new nodularity, masses, tenderness, nipple changes, or nipple discharge.    PAST MEDICAL/SURGICAL HISTORY:  Past Medical History:  Diagnosis Date  . Arthritis    knees, hips  . Breast cancer [facility]) August 2011   left - lumpectomy  . Cataract   . GERD (gastroesophageal reflux disease)    minor - tx with zantac  . H/O bladder infections   . Headache(784.0)    otc med prn  . Heart murmur    slight murmur -never had any problems  . Hernia   . Hypertension    no meds x 3 yrs  . Hypothyroidism   . Osteoporosis   . 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 Thyroid disease   . Ulcer   .  Urinary incontinence    Past Surgical History:  Procedure Laterality Date  . APPENDECTOMY    . BREAST LUMPECTOMY     left  . EYE SURGERY     left- cataract   . HIATAL HERNIA REPAIR     x 2  . HYSTEROSCOPY WITH D & C N/A 07/07/2013   Procedure: DILATATION AND CURETTAGE /HYSTEROSCOPY;  Surgeon: Anastasio Auerbach, MD;  Location: [facility];  Service: Gynecology;  Laterality: N/A;  . KNEE SURGERY     Arthroscopic  . left cataract surgery    .  TONSILLECTOMY       ALLERGIES:  Allergies  Allergen Reactions  . Advair Diskus [Fluticasone-Salmeterol]     Nervous and shakey  . Aspirin     If she takes too much, it burns her stomach  . Bactrim [Sulfamethoxazole-Trimethoprim] Nausea Only  . Demerol Nausea Only  . Meperidine Nausea Only    Sick on Stomach  . Qvar [Beclomethasone] Other (See Comments)    Headache     CURRENT MEDICATIONS:  Outpatient Encounter Medications as of 03/25/2020  Medication Sig  . acetaminophen (TYLENOL) 500 MG tablet Take 500 mg by mouth every 6 (six) hours as needed for pain.   Marland Kitchen amLODipine (NORVASC) 10 MG tablet Take 1 tablet by mouth daily  . BIOTIN FORTE PO Take 1 tablet by mouth daily.  Marland Kitchen CALCIUM PO Take 1 tablet by mouth daily.   . cephALEXin (KEFLEX) 250 MG capsule Take 250 mg by mouth daily.  . Cholecalciferol (VITAMIN D) 1000 UNITS capsule Take 1,000 Units by mouth daily.   . cimetidine (TAGAMET) 200 MG tablet Take 200 mg by mouth 2 (two) times daily.  . Cranberry 500 MG CAPS Take 2 capsules by mouth daily.   Marland Kitchen etodolac (LODINE) 500 MG tablet Take 1 tablet by mouth twice daily  . fish oil-omega-3 fatty acids 1000 MG capsule Take 2 g by mouth daily.   Marland Kitchen glucosamine-chondroitin 500-400 MG tablet Take 2 tablets by mouth daily.   Marland Kitchen levothyroxine (SYNTHROID) 25 MCG tablet Take 1 tablet (25 mcg total) by mouth daily.  Marland Kitchen loratadine (CLARITIN) 10 MG tablet Take 10 mg by mouth daily.    . meclizine (ANTIVERT) 25 MG tablet Take 1 tablet (25 mg total) by mouth 3 (three) times daily as needed for dizziness.  . metoprolol succinate (TOPROL-XL) 25 MG 24 hr tablet Take 1 tablet (25 mg total) by mouth daily.  . Multiple Vitamin (MULTIVITAMIN) capsule Take 1 capsule by mouth daily.    . pantoprazole (PROTONIX) 40 MG tablet Take 1 tablet by mouth once daily  . Polyvinyl Alcohol-Povidone (CLEAR EYES ALL SEASONS OP) Apply 1 drop to eye daily as needed (dry eyes).  . Probiotic Product (ALIGN PO) Take by mouth.    . pyridOXINE (VITAMIN B-6) 100 MG tablet Take 100 mg by mouth daily.    . raloxifene (EVISTA) 60 MG tablet Take 1 tablet (60 mg total) by mouth daily.   No facility-administered encounter medications on file as of 03/25/2020.     ONCOLOGIC FAMILY HISTORY:  Family History  Problem Relation Age of Onset  . Prostate cancer Maternal Grandfather   . Cancer Paternal Grandfather        Stomach cancer  . Rheum arthritis Mother   . Heart disease Maternal Grandmother   . Breast cancer Maternal Aunt 70    GENETIC COUNSELING/TESTING: Not at this time  SOCIAL HISTORY:  Social History   Socioeconomic History  . Marital status: Widowed  Spouse name: Not on file  . Number of children: 0  . Years of education: Not on file  . Highest education level: Not on file  Occupational History  . Occupation: Retired     Comment: [employer]  Tobacco Use  . Smoking status: Never Smoker  . Smokeless tobacco: Never Used  Substance and Sexual Activity  . Alcohol use: No  . Drug use: No  . Sexual activity: Not Currently    Birth control/protection: Post-menopausal  Other Topics Concern  . Not on file  Social History Narrative  . Not on file   Social Determinants of Health   Financial Resource Strain:   . Difficulty of Paying Living Expenses:   Food Insecurity:   . Worried About Charity fundraiser in the Last Year:   . Arboriculturist in the Last Year:   Transportation Needs:   . Film/video editor (Medical):   Marland Kitchen Lack of Transportation (Non-Medical):   Physical Activity:   . Days of Exercise per Week:   . Minutes of Exercise per Session:   Stress:   . Feeling of Stress :   Social Connections:   . Frequency of Communication with Friends and Family:   . Frequency of Social Gatherings with Friends and Family:   . Attends Religious Services:   . Active Member of Clubs or Organizations:   . Attends Archivist Meetings:   Marland Kitchen Marital Status:   Intimate Partner Violence:    . Fear of Current or Ex-Partner:   . Emotionally Abused:   Marland Kitchen Physically Abused:   . Sexually Abused:       OBJECTIVE:  BP (!) 130/58 (BP Location: Left Arm, Patient Position: Sitting)   Pulse (!) 58   Temp 98.9 F (37.2 C) (Oral)   Resp 18   Ht '5\' 1"'  (1.549 m)   Wt 191 lb 9.6 oz (86.9 kg)   SpO2 97%   BMI 36.20 kg/m  GENERAL: Patient is a well appearing female in no acute distress HEENT:  Sclerae anicteric.  Oropharynx clear and moist. No ulcerations or evidence of oropharyngeal candidiasis. Neck is supple.  NODES:  No cervical, supraclavicular, or axillary lymphadenopathy palpated. BREAST EXAM:  Left breast s/p lumpectomy and radiation, right breast benign LUNGS:  Clear to auscultation bilaterally.  No wheezes or rhonchi. HEART:  Regular rate and rhythm. No murmur appreciated. ABDOMEN:  Soft, nontender.  Positive, normoactive bowel sounds. No organomegaly palpated. MSK:  No focal spinal tenderness to palpation. Full range of motion bilaterally in the upper extremities. EXTREMITIES:  No peripheral edema.   SKIN:  Clear with no obvious rashes or skin changes. No nail dyscrasia. NEURO:  Nonfocal. Well oriented.  Appropriate affect.    LABORATORY DATA:  None for this visit   DIAGNOSTIC IMAGING:  Most recent mammogram:     ASSESSMENT AND PLAN:  Ms.. Bergman is a pleasant 79 y.o. female with history of Stage IA left breast invasive ductal carcinoma, ER+/PR+/HER2-, diagnosed in 05/2010, treated with lumpectomy, adjuvant radiation therapy, and anti-estrogen therapy with Tamoxifen x 6 years completed in 2017.  [facility] and routine follow-up.   1. History of breast cancer:  Ms. Shek is currently clinically and radiographically without evidence of disease or recurrence of breast cancer. She will be due for mammogram in 09/2020.   She will return to see me in one year for LTS follow up.  I encouraged her to call me with any  questions or  concerns before her next visit at the cancer center, and I would be happy to see her sooner, if needed.    2. Bone health:  Given Ms. Haymore's age, history of breast cancer, she is at risk for bone demineralization. She underwent bone density testing in 09/2019 showing osteoporosis and is receiving Prolia with Dr. [name].  She was given education on specific food and activities to promote bone health.  3. Cancer screening:  Due to Ms. Evelyn's history and her age, she should receive screening for skin cancers, colon cancer. She was encouraged to follow-up with her PCP for appropriate cancer screenings.   4. Health maintenance and wellness promotion: Ms. Poulsen was encouraged to consume 5-7 servings of fruits and vegetables per day. She was also encouraged to engage in moderate to vigorous exercise for 30 minutes per day most days of the week. She was instructed to limit her alcohol consumption and continue to abstain from tobacco use.    5. Bowel changes: I offered to reach out to Dr. Hilarie Fredrickson on Eileen's behalf about her bowel issues.  She is grateful.      Follow up instructions:    -Return to cancer center in in 1 year  -Mammogram due in 09/2020   Total encounter time: 20 minutes*   Gardenia Phlegm, NP [place] [phone]   Note: PRIMARY CARE PROVIDER [name], MD [phone]   *Total Encounter Time as defined by the Centers for Medicare and Medicaid Services includes, in addition to the face-to-face time of a patient visit (documented in the note above) non-face-to-face time: obtaining and reviewing outside history, ordering and reviewing medications, tests or procedures, care coordination (communications with other health care professionals or caregivers) and documentation in the medical record.

## 2020-03-25 NOTE — Addendum Note (Signed)
Addended by: Myrtie Hawk on: 03/25/2020 03:55 PM   Modules accepted: Orders

## 2020-05-28 ENCOUNTER — Other Ambulatory Visit: Payer: Self-pay | Admitting: Family Medicine

## 2020-05-28 DIAGNOSIS — I10 Essential (primary) hypertension: Secondary | ICD-10-CM

## 2020-06-03 ENCOUNTER — Encounter: Payer: Self-pay | Admitting: *Deleted

## 2020-07-22 ENCOUNTER — Telehealth: Payer: Self-pay

## 2020-07-22 NOTE — Telephone Encounter (Signed)
Pt needs refill on metoprolol succinate (TOPROL-XL) 25 MG 24 hr tablet , amLODipine (NORVASC) 10 MG tablet [place] ([place]), [place] - [address]   Pt call back is [phone]

## 2020-07-23 ENCOUNTER — Other Ambulatory Visit: Payer: Self-pay | Admitting: Family Medicine

## 2020-07-23 DIAGNOSIS — I10 Essential (primary) hypertension: Secondary | ICD-10-CM

## 2020-07-23 NOTE — Telephone Encounter (Signed)
Given 30 days supply, needs OV

## 2020-07-30 ENCOUNTER — Ambulatory Visit (INDEPENDENT_AMBULATORY_CARE_PROVIDER_SITE_OTHER): Payer: Medicare Other | Admitting: Family Medicine

## 2020-07-30 ENCOUNTER — Other Ambulatory Visit: Payer: Self-pay

## 2020-07-30 VITALS — BP 118/84 | HR 75 | Temp 97.5°F | Ht 61.0 in | Wt 182.0 lb

## 2020-07-30 DIAGNOSIS — R682 Dry mouth, unspecified: Secondary | ICD-10-CM | POA: Diagnosis not present

## 2020-07-30 DIAGNOSIS — I10 Essential (primary) hypertension: Secondary | ICD-10-CM

## 2020-07-30 DIAGNOSIS — M810 Age-related osteoporosis without current pathological fracture: Secondary | ICD-10-CM

## 2020-07-30 DIAGNOSIS — H04123 Dry eye syndrome of bilateral lacrimal glands: Secondary | ICD-10-CM

## 2020-07-30 DIAGNOSIS — Z23 Encounter for immunization: Secondary | ICD-10-CM

## 2020-07-30 DIAGNOSIS — E039 Hypothyroidism, unspecified: Secondary | ICD-10-CM

## 2020-07-30 MED ORDER — COLESTIPOL HCL 1 G PO TABS: 1.0000 g | ORAL_TABLET | Freq: Two times a day (BID) | ORAL | 3 refills | Status: DC

## 2020-07-30 NOTE — Progress Notes (Signed)
Subjective:    Patient ID: Joan Weaver, female    DOB: 01/24/1941, 79 y.o.   MRN: [id number]  HPI Patient is a very sweet 79 year old Caucasian female here today for checkup.  She has a history of osteoporosis.  She stopped taking her Prolia injection however because her teeth began breaking in her mouth at the gumline.  Her dentist attributes this to extremely dry mouth and decay related to that.  She also suffers from very dry eyes and has to take Restasis.  Therefore Sjogren's disease is a possibility.  She is still taking Evista for osteoporosis.  Her blood pressure today is acceptable at 118/84.  She is due for lab work.  She also has a history of hypothyroidism for which she takes levothyroxine.  She also has irritable bowel syndrome.  For years she has diarrhea first thing in the morning.  She will have to go to the bathroom several times first thing in the morning and sometimes she has nausea associated with it.  Usually later in the day the symptoms will improve.  She has been taking some of her friends colestipol and this is helped the diarrhea and she would like a prescription of this for herself.  She has had her Covid shot but she is due for a flu shot.  She denies any falls or depression or memory loss. Past Medical History:  Diagnosis Date  . Arthritis    knees, hips  . Breast cancer [facility]) August 2011   left - lumpectomy  . Cataract   . GERD (gastroesophageal reflux disease)    minor - tx with zantac  . H/O bladder infections   . Headache(784.0)    otc med prn  . Heart murmur    slight murmur -never had any problems  . Hernia   . Hypertension    no meds x 3 yrs  . Hypothyroidism   . Osteoporosis   . 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 Thyroid disease   . Ulcer   . Urinary incontinence    Past Surgical History:  Procedure Laterality Date  . APPENDECTOMY    . BREAST LUMPECTOMY     left  . EYE SURGERY     left- cataract    . HIATAL HERNIA REPAIR     x 2  . HYSTEROSCOPY WITH D & C N/A 07/07/2013   Procedure: DILATATION AND CURETTAGE /HYSTEROSCOPY;  Surgeon: Anastasio Auerbach, MD;  Location: [facility];  Service: Gynecology;  Laterality: N/A;  . KNEE SURGERY     Arthroscopic  . left cataract surgery    . TONSILLECTOMY     Current Outpatient Medications on File Prior to Visit  Medication Sig Dispense Refill  . acetaminophen (TYLENOL) 500 MG tablet Take 500 mg by mouth every 6 (six) hours as needed for pain.     Marland Kitchen amLODipine (NORVASC) 10 MG tablet TAKE 1 TABLET BY MOUTH DAILY ( PATIENT NEEDS TO CALL THE [facility] TO SCHEDULE AN APPOINTMENT FOR FUTURE REFILLS [phone]) 30 tablet 0  . BIOTIN FORTE PO Take 1 tablet by mouth daily.    Marland Kitchen CALCIUM PO Take 1 tablet by mouth daily.     . Cholecalciferol (VITAMIN D) 1000 UNITS capsule Take 1,000 Units by mouth daily.     . cycloSPORINE (RESTASIS) 0.05 % ophthalmic emulsion 1 drop 2 (two) times daily.    Marland Kitchen denosumab (PROLIA) 60 MG/ML SOSY injection Inject 60 mg  into the skin every 6 (six) months. Dr. [name]    . fish oil-omega-3 fatty acids 1000 MG capsule Take 2 g by mouth daily.     Marland Kitchen glucosamine-chondroitin 500-400 MG tablet Take 2 tablets by mouth daily.     Marland Kitchen levothyroxine (SYNTHROID) 25 MCG tablet Take 1 tablet (25 mcg total) by mouth daily. 90 tablet 3  . loratadine (CLARITIN) 10 MG tablet Take 10 mg by mouth daily.      . meclizine (ANTIVERT) 25 MG tablet Take 1 tablet (25 mg total) by mouth 3 (three) times daily as needed for dizziness. 30 tablet 1  . metoprolol succinate (TOPROL-XL) 25 MG 24 hr tablet TAKE 1 TABLET BY MOUTH  DAILY ( REQUIRED OFFICE VISIT BEFORE ANY FURTHER REFILLS CAN BE GIVEN CALL [phone] FOR FUTURE REFILLS ) 30 tablet 0  . Multiple Vitamin (MULTIVITAMIN) capsule Take 1 capsule by mouth daily.      . pantoprazole (PROTONIX) 40 MG tablet Take 1 tablet by mouth once daily 90 tablet 3  . Polyvinyl Alcohol-Povidone (CLEAR EYES ALL SEASONS OP)  Apply 1 drop to eye daily as needed (dry eyes).    . Probiotic Product (ALIGN PO) Take by mouth.    . pyridOXINE (VITAMIN B-6) 100 MG tablet Take 100 mg by mouth daily.      . raloxifene (EVISTA) 60 MG tablet Take 1 tablet (60 mg total) by mouth daily. 90 tablet 3   No current facility-administered medications on file prior to visit.      Review of Systems  All other systems reviewed and are negative.      Objective:   Physical Exam Vitals reviewed.  Constitutional:      General: She is not in acute distress.    Appearance: She is obese. She is not ill-appearing or toxic-appearing.  Cardiovascular:     Rate and Rhythm: Normal rate and regular rhythm.     Pulses: Normal pulses.     Heart sounds: Normal heart sounds. No murmur heard.   Pulmonary:     Effort: Pulmonary effort is normal. No respiratory distress.     Breath sounds: Normal breath sounds. No stridor. No wheezing, rhonchi or rales.  Abdominal:     General: Abdomen is flat. Bowel sounds are normal. There is no distension.     Palpations: Abdomen is soft.     Tenderness: There is no abdominal tenderness. There is no guarding.  Musculoskeletal:     Right lower leg: No edema.     Left lower leg: No edema.  Neurological:     Mental Status: She is alert.           Assessment & Plan:  Dry mouth, unspecified - Plan: Sjogren's syndrome antibods(ssa + ssb)  Dry eyes - Plan: Sjogren's syndrome antibods(ssa + ssb)  Benign essential HTN - Plan: CBC with Differential/Platelet, COMPLETE METABOLIC PANEL WITH GFR, Lipid panel  Hypothyroidism, unspecified type - Plan: TSH  Osteoporosis, post-menopausal  IBS with diarrhea   Given the dry eyes and dry mouth, I will check antibodies for Sjogren's syndrome.  Meanwhile check a CBC, CMP, and fasting lipid panel.  Blood pressure today is well controlled.  Ideally I like to keep her LDL cholesterol less than 130.  Check a TSH to screen for hypothyroidism management and to  ensure that the dose of levothyroxine is adequate.  Patient has osteoporosis however she has several fractures of her teeth at the gumline.  Therefore I have recommended that she discontinue Prolia  until the issues in her mouth have resolved.  Meanwhile continue Evista for osteoporosis and evaluate the patient for Sjogren's syndrome.  Patient received her flu shot today.  Pneumonia vaccines are up-to-date.  Did encourage the patient to get a booster on her Covid shot.  She had her second Covid shot in March and therefore she is due.  I will give the patient a trial of colestipol 1 g p.o. twice daily for diarrhea due to what I suspect is irritable bowel syndrome

## 2020-07-31 LAB — LIPID PANEL
Cholesterol: 182 mg/dL (ref <200)
HDL: 68 mg/dL (ref >=50)
LDL Cholesterol (Calc): 91 mg/dL (calc)
Non-HDL Cholesterol (Calc): 114 mg/dL (calc) (ref <130)
Total CHOL/HDL Ratio: 2.7 (calc) (ref <5.0)
Triglycerides: 136 mg/dL (ref <150)

## 2020-07-31 LAB — COMPLETE METABOLIC PANEL WITH GFR
AG Ratio: 1.5 (calc) (ref 1.0–2.5)
ALT: 17 U/L (ref 6–29)
AST: 23 U/L (ref 10–35)
Albumin: 4.2 g/dL (ref 3.6–5.1)
Alkaline phosphatase (APISO): 48 U/L (ref 37–153)
BUN/Creatinine Ratio: 18 (calc) (ref 6–22)
BUN: 19 mg/dL (ref 7–25)
CO2: 27 mmol/L (ref 20–32)
Calcium: 9.4 mg/dL (ref 8.6–10.4)
Chloride: 101 mmol/L (ref 98–110)
Creat: 1.04 mg/dL — ABNORMAL HIGH (ref 0.60–0.93)
GFR, Est African American: 59 mL/min/{1.73_m2} — ABNORMAL LOW (ref >=60)
GFR, Est Non African American: 51 mL/min/{1.73_m2} — ABNORMAL LOW (ref >=60)
Globulin: 2.8 g/dL (calc) (ref 1.9–3.7)
Glucose, Bld: 101 mg/dL — ABNORMAL HIGH (ref 65–99)
Potassium: 4.4 mmol/L (ref 3.5–5.3)
Sodium: 137 mmol/L (ref 135–146)
Total Bilirubin: 0.4 mg/dL (ref 0.2–1.2)
Total Protein: 7 g/dL (ref 6.1–8.1)

## 2020-07-31 LAB — CBC WITH DIFFERENTIAL/PLATELET
Absolute Monocytes: 623 cells/uL (ref 200–950)
Basophils Absolute: 68 cells/uL (ref 0–200)
Basophils Relative: 0.9 %
Eosinophils Absolute: 143 cells/uL (ref 15–500)
Eosinophils Relative: 1.9 %
HCT: 44.6 % (ref 35.0–45.0)
Hemoglobin: 15 g/dL (ref 11.7–15.5)
Lymphs Abs: 1898 cells/uL (ref 850–3900)
MCH: 30.1 pg (ref 27.0–33.0)
MCHC: 33.6 g/dL (ref 32.0–36.0)
MCV: 89.4 fL (ref 80.0–100.0)
MPV: 10.8 fL (ref 7.5–12.5)
Monocytes Relative: 8.3 %
Neutro Abs: 4770 cells/uL (ref 1500–7800)
Neutrophils Relative %: 63.6 %
Platelets: 272 10*3/uL (ref 140–400)
RBC: 4.99 10*6/uL (ref 3.80–5.10)
RDW: 13.1 % (ref 11.0–15.0)
Total Lymphocyte: 25.3 %
WBC: 7.5 10*3/uL (ref 3.8–10.8)

## 2020-08-02 LAB — SJOGREN'S SYNDROME ANTIBODS(SSA + SSB)
SSA (Ro) (ENA) Antibody, IgG: <1 AI
SSB (La) (ENA) Antibody, IgG: <1 AI

## 2020-08-06 ENCOUNTER — Other Ambulatory Visit: Payer: Self-pay | Admitting: Family Medicine

## 2020-08-10 ENCOUNTER — Other Ambulatory Visit: Payer: Self-pay | Admitting: Family Medicine

## 2020-08-27 ENCOUNTER — Other Ambulatory Visit: Payer: Self-pay | Admitting: Family Medicine

## 2020-08-27 DIAGNOSIS — I10 Essential (primary) hypertension: Secondary | ICD-10-CM

## 2020-08-28 ENCOUNTER — Other Ambulatory Visit: Payer: Self-pay | Admitting: Family Medicine

## 2020-08-28 DIAGNOSIS — I10 Essential (primary) hypertension: Secondary | ICD-10-CM

## 2020-09-15 DIAGNOSIS — R350 Frequency of micturition: Secondary | ICD-10-CM | POA: Diagnosis not present

## 2020-09-15 DIAGNOSIS — R8271 Bacteriuria: Secondary | ICD-10-CM | POA: Diagnosis not present

## 2020-10-08 ENCOUNTER — Other Ambulatory Visit: Payer: Self-pay | Admitting: Family Medicine

## 2020-10-08 DIAGNOSIS — I10 Essential (primary) hypertension: Secondary | ICD-10-CM

## 2020-10-21 ENCOUNTER — Encounter: Payer: Self-pay | Admitting: Family Medicine

## 2020-10-21 DIAGNOSIS — Z1231 Encounter for screening mammogram for malignant neoplasm of breast: Secondary | ICD-10-CM | POA: Diagnosis not present

## 2020-11-04 ENCOUNTER — Telehealth: Payer: Self-pay | Admitting: Family Medicine

## 2020-11-04 DIAGNOSIS — I10 Essential (primary) hypertension: Secondary | ICD-10-CM

## 2020-11-04 MED ORDER — PANTOPRAZOLE SODIUM 40 MG PO TBEC
40.0000 mg | DELAYED_RELEASE_TABLET | Freq: Every day | ORAL | 3 refills | Status: DC
Start: 2020-11-04 — End: 2021-01-07

## 2020-11-04 MED ORDER — AMLODIPINE BESYLATE 10 MG PO TABS: ORAL_TABLET | ORAL | 0 refills | Status: DC

## 2020-11-04 MED ORDER — RALOXIFENE HCL 60 MG PO TABS
60.0000 mg | ORAL_TABLET | Freq: Every day | ORAL | 3 refills | Status: DC
Start: 2020-11-04 — End: 2021-07-19

## 2020-11-04 MED ORDER — METOPROLOL SUCCINATE ER 25 MG PO TB24: 25.0000 mg | ORAL_TABLET | Freq: Every day | ORAL | 3 refills | Status: DC

## 2020-11-04 MED ORDER — LEVOTHYROXINE SODIUM 25 MCG PO TABS: 25.0000 ug | ORAL_TABLET | Freq: Every day | ORAL | 3 refills | Status: DC

## 2020-11-04 NOTE — Telephone Encounter (Signed)
Prescription sent to pharmacy.

## 2020-11-04 NOTE — Telephone Encounter (Signed)
metoprolol succinate (TOPROL-XL) 25 MG 24 hr tablet,pantoprazole,amLODipine (NORVASC) 10 MG ,raloxifene (EVISTA) 60 MG tablet ,levothyroxine (SYNTHROID) 25

## 2020-11-09 ENCOUNTER — Other Ambulatory Visit: Payer: Self-pay | Admitting: Family Medicine

## 2020-12-17 ENCOUNTER — Other Ambulatory Visit: Payer: Self-pay | Admitting: Family Medicine

## 2020-12-17 DIAGNOSIS — I10 Essential (primary) hypertension: Secondary | ICD-10-CM

## 2021-01-07 ENCOUNTER — Other Ambulatory Visit: Payer: Self-pay | Admitting: Family Medicine

## 2021-01-27 ENCOUNTER — Other Ambulatory Visit: Payer: Self-pay | Admitting: Family Medicine

## 2021-01-27 DIAGNOSIS — I10 Essential (primary) hypertension: Secondary | ICD-10-CM

## 2021-03-10 ENCOUNTER — Other Ambulatory Visit: Payer: Self-pay | Admitting: *Deleted

## 2021-03-10 DIAGNOSIS — I10 Essential (primary) hypertension: Secondary | ICD-10-CM

## 2021-03-10 MED ORDER — AMLODIPINE BESYLATE 10 MG PO TABS: ORAL_TABLET | ORAL | 0 refills | Status: DC

## 2021-03-10 MED ORDER — METOPROLOL SUCCINATE ER 25 MG PO TB24: 25.0000 mg | ORAL_TABLET | Freq: Every day | ORAL | 0 refills | Status: DC

## 2021-03-11 DIAGNOSIS — H524 Presbyopia: Secondary | ICD-10-CM | POA: Diagnosis not present

## 2021-03-11 DIAGNOSIS — H5213 Myopia, bilateral: Secondary | ICD-10-CM | POA: Diagnosis not present

## 2021-03-30 NOTE — Progress Notes (Signed)
SURVIVORSHIP VISIT:      REASON FOR VISIT:  Routine follow-up for history of breast cancer.   BRIEF ONCOLOGIC HISTORY:  Oncology History  Breast cancer, left ([place])  05/11/2010 Surgery   Left breast lumpectomy: Invasive ductal carcinoma 6 mm, grade 2, margins negative, 0/2 sentinel nodes, ER 98%, PR 97%, HER-2 negative, Ki-67 18%, T1b N0 M0 stage IA    05/30/2010 - 07/06/2010 Radiation Therapy   Adjuvant radiation by Dr. Lisbeth Renshaw    07/08/2010 - 12/29/2015 Anti-estrogen oral therapy   Tamoxifen 20 mg daily       INTERVAL HISTORY:  Joan Weaver presents to the [facility] today for routine follow-up for her history of breast cancer.  Overall, she reports feeling quite well.   Since her last visit, she underwent a bilateral 3d screening mammogram on 10/23/2020 at [facility] that showed no evidence of malignancy and breast density category B. On 10/08/2019 she underwent bone density testing at [facility] that detected worsening osteoporosis.  Dr. [name] had recommended Prolia at that time. She received a couple of doses of prolia, however had some dental issues and was soon after switched over to Raloxifene.  She is tolerating that well.  She has had some knee arthritis, and irritable bowel issues this year, but otherwise notes she is feeling well and is up to date with her health maintenance.     REVIEW OF SYSTEMS:  Review of Systems  Constitutional:  Negative for appetite change, chills, fatigue, fever and unexpected weight change.  HENT:   Negative for hearing loss, lump/mass, mouth sores, sore throat and trouble swallowing.   Eyes:  Negative for eye problems and icterus.  Respiratory:  Negative for chest tightness, cough and shortness of breath.   Cardiovascular:  Negative for chest pain, leg swelling and palpitations.  Gastrointestinal:  Negative for abdominal distention, abdominal pain, constipation, diarrhea, nausea and vomiting.  Endocrine: Negative for hot flashes.  Genitourinary:   Negative for difficulty urinating.   Musculoskeletal:  Negative for arthralgias.  Skin:  Negative for itching and rash.  Neurological:  Negative for dizziness, extremity weakness, headaches and numbness.  Hematological:  Negative for adenopathy. Does not bruise/bleed easily.  Psychiatric/Behavioral:  Negative for depression. The patient is not nervous/anxious.  Breast: Denies any new nodularity, masses, tenderness, nipple changes, or nipple discharge.    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- Dr  Elsworth Soho   Thyroid disease    Ulcer    Urinary incontinence    Past Surgical History:  Procedure Laterality Date   APPENDECTOMY     BREAST LUMPECTOMY     left   EYE SURGERY     left- cataract    HIATAL HERNIA REPAIR     x 2   HYSTEROSCOPY WITH D & C N/A 07/07/2013   Procedure: DILATATION AND CURETTAGE /HYSTEROSCOPY;  Surgeon: Anastasio Auerbach, MD;  Location: [facility];  Service: Gynecology;  Laterality: N/A;   KNEE SURGERY     Arthroscopic   left cataract surgery     TONSILLECTOMY       ALLERGIES:  Allergies  Allergen Reactions   Advair Diskus [Fluticasone-Salmeterol]     Nervous and shakey   Aspirin     If she takes too much, it burns her stomach   Bactrim [Sulfamethoxazole-Trimethoprim] Nausea Only   Demerol Nausea Only   Meperidine Nausea Only    Sick on Stomach   Qvar [Beclomethasone] Other (See Comments)    Headache     CURRENT MEDICATIONS:  Outpatient Encounter Medications as of 03/31/2021  Medication Sig    acetaminophen (TYLENOL) 500 MG tablet Take 500 mg by mouth every 6 (six) hours as needed for pain.    amLODipine (NORVASC) 10 MG tablet TAKE 1 TABLET BY MOUTH ONCE DAILY .   BIOTIN FORTE PO Take 1 tablet by mouth daily.   CALCIUM PO Take 1 tablet by mouth daily.    Cholecalciferol (VITAMIN D) 1000 UNITS capsule Take 1,000 Units by mouth daily.    colestipol (COLESTID) 1 g tablet Take 1 tablet (1 g total) by mouth 2 (two) times daily.   cycloSPORINE (RESTASIS) 0.05 % ophthalmic emulsion 1 drop 2 (two) times daily.   fish oil-omega-3 fatty acids 1000 MG capsule Take 2 g by mouth daily.    glucosamine-chondroitin 500-400 MG tablet Take 2 tablets by mouth daily.    levothyroxine (SYNTHROID) 25 MCG tablet Take 1 tablet (25 mcg total) by mouth daily.   loratadine (CLARITIN) 10 MG tablet Take 10 mg by mouth daily.     meclizine (ANTIVERT) 25 MG tablet Take 1 tablet (25 mg total) by mouth 3 (three) times daily as needed for dizziness.   metoprolol succinate (TOPROL-XL) 25 MG 24 hr tablet Take 1 tablet (25 mg total) by mouth daily.   Multiple Vitamin (MULTIVITAMIN) capsule Take 1 capsule by mouth daily.     pantoprazole (PROTONIX) 40 MG tablet Take 1 tablet by mouth once daily   Polyvinyl Alcohol-Povidone (CLEAR EYES ALL SEASONS OP) Apply 1 drop to eye daily as needed (dry eyes).   Probiotic Product (ALIGN PO) Take by mouth.   pyridOXINE (VITAMIN B-6) 100 MG tablet Take 100 mg by mouth daily.     raloxifene (EVISTA) 60 MG tablet Take 1 tablet (60 mg total) by mouth daily.   [DISCONTINUED] denosumab (PROLIA) 60 MG/ML SOSY injection Inject 60 mg into the skin every 6 (six) months. Dr. [name]   No facility-administered encounter medications on file as of 03/31/2021.     ONCOLOGIC FAMILY HISTORY:  Family History  Problem Relation Age of Onset   Prostate cancer Maternal Grandfather    Cancer Paternal Grandfather        Stomach cancer   Rheum arthritis Mother    Heart disease Maternal Grandmother     Breast cancer Maternal Aunt 70    GENETIC COUNSELING/TESTING: Not at this time  SOCIAL HISTORY:  Social History   Socioeconomic History   Marital status: Widowed    Spouse name: Not on file   Number of children: 0   Years of education: Not on file   Highest education level: Not on file  Occupational History   Occupation: Retired     Comment: [employer]  Tobacco Use   Smoking status: Never   Smokeless tobacco: Never  Substance and Sexual Activity   Alcohol use: No   Drug use: No   Sexual activity: Not  Currently    Birth control/protection: Post-menopausal  Other Topics Concern   Not on file  Social History Narrative   Not on file   Social Determinants of Health   Financial Resource Strain: Not on file  Food Insecurity: Not on file  Transportation Needs: Not on file  Physical Activity: Inactive   Days of Exercise per Week: 0 days   Minutes of Exercise per Session: 0 min  Stress: Not on file  Social Connections: Not on file  Intimate Partner Violence: Not At Risk   Fear of Current or Ex-Partner: No   Emotionally Abused: No   Physically Abused: No   Sexually Abused: No      OBJECTIVE:  BP (!) 136/95 (BP Location: Left Arm, Patient Position: Sitting)   Pulse 96   Temp 97.9 F (36.6 C) (Tympanic)   Resp 18   Wt 188 lb 12.8 oz (85.6 kg)   SpO2 98%   BMI 35.67 kg/m  GENERAL: Patient is a well appearing female in no acute distress HEENT:  Sclerae anicteric.  Oropharynx clear and moist. No ulcerations or evidence of oropharyngeal candidiasis. Neck is supple.  NODES:  No cervical, supraclavicular, or axillary lymphadenopathy palpated. BREAST EXAM:  Left breast s/p lumpectomy and radiation no sign of local recurrence, right breast benign LUNGS:  Clear to auscultation bilaterally.  No wheezes or rhonchi. HEART:  Regular rate and rhythm. No murmur appreciated. ABDOMEN:  Soft, nontender.  Positive, normoactive bowel sounds. No organomegaly palpated. MSK:  No  focal spinal tenderness to palpation. Full range of motion bilaterally in the upper extremities. EXTREMITIES:  No peripheral edema.   SKIN:  Clear with no obvious rashes or skin changes. No nail dyscrasia. NEURO:  Nonfocal. Well oriented.  Appropriate affect.    LABORATORY DATA:  None for this visit   DIAGNOSTIC IMAGING:  Most recent mammogram:  Normal at [place] in 10/2020   ASSESSMENT AND PLAN:  Joan Weaver is a pleasant 80 y.o. female with history of Stage IA left breast invasive ductal carcinoma, ER+/PR+/HER2-, diagnosed in 05/2010, treated with lumpectomy, adjuvant radiation therapy, and anti-estrogen therapy with Tamoxifen x 6 years completed in 2017.  [facility] and routine follow-up.   1. History of breast cancer:  Joan Weaver is currently clinically and radiographically without evidence of disease or recurrence of breast cancer. She will be due for mammogram in 10/2021.   She will return to see me in one year for LTS follow up.  I encouraged her to call me with any questions or concerns before her next visit at the cancer center, and I would be happy to see her sooner, if needed.    2. Bone health:  Given Joan Weaver's age, history of breast cancer, she is at risk for bone demineralization. She underwent bone density testing in 09/2019 showing osteoporosis and is taking Raloxifene prescribed and managed by Dr. [name]. She was given education on specific food and activities to promote bone health.  3. Cancer screening:  Due to Joan Weaver's history and her age, she should receive screening for skin cancers, colon cancer. She was encouraged to follow-up with her PCP for appropriate cancer screenings.   4. Health maintenance and wellness promotion: Joan Weaver was encouraged to consume 5-7 servings of fruits and vegetables per day. She was also encouraged to engage in moderate to vigorous exercise as she is able (she has arthritis in her knees). She was  instructed to limit her alcohol consumption  and continue to abstain from tobacco use.       Follow up instructions:    -Return to cancer center in in 1 year  -Mammogram due in 10/2021   Total encounter time: 20 minutes* in chart review, face to face visit time, and documentation.     Gardenia Phlegm, NP [facility] [place] [phone]   Note: PRIMARY CARE PROVIDER [name], MD [phone]   *Total Encounter Time as defined by the Centers for Medicare and Medicaid Services includes, in addition to the face-to-face time of a patient visit (documented in the note above) non-face-to-face time: obtaining and reviewing outside history, ordering and reviewing medications, tests or procedures, care coordination (communications with other health care professionals or caregivers) and documentation in the medical record.

## 2021-03-31 ENCOUNTER — Inpatient Hospital Stay: Payer: Medicare Other | Attending: Adult Health | Admitting: Adult Health

## 2021-03-31 ENCOUNTER — Encounter: Payer: Self-pay | Admitting: Adult Health

## 2021-03-31 ENCOUNTER — Other Ambulatory Visit: Payer: Self-pay

## 2021-03-31 VITALS — BP 136/95 | HR 96 | Temp 97.9°F | Resp 18 | Wt 188.8 lb

## 2021-03-31 DIAGNOSIS — Z923 Personal history of irradiation: Secondary | ICD-10-CM | POA: Insufficient documentation

## 2021-03-31 DIAGNOSIS — Z79899 Other long term (current) drug therapy: Secondary | ICD-10-CM | POA: Insufficient documentation

## 2021-03-31 DIAGNOSIS — I10 Essential (primary) hypertension: Secondary | ICD-10-CM | POA: Insufficient documentation

## 2021-03-31 DIAGNOSIS — E039 Hypothyroidism, unspecified: Secondary | ICD-10-CM | POA: Insufficient documentation

## 2021-03-31 DIAGNOSIS — Z170 Estrogen receptor positive status [ER+]: Secondary | ICD-10-CM

## 2021-03-31 DIAGNOSIS — K589 Irritable bowel syndrome without diarrhea: Secondary | ICD-10-CM | POA: Insufficient documentation

## 2021-03-31 DIAGNOSIS — C50512 Malignant neoplasm of lower-outer quadrant of left female breast: Secondary | ICD-10-CM

## 2021-03-31 DIAGNOSIS — Z853 Personal history of malignant neoplasm of breast: Secondary | ICD-10-CM | POA: Diagnosis not present

## 2021-03-31 NOTE — Patient Instructions (Signed)

## 2021-04-08 ENCOUNTER — Other Ambulatory Visit: Payer: Self-pay | Admitting: Family Medicine

## 2021-06-07 ENCOUNTER — Other Ambulatory Visit: Payer: Self-pay | Admitting: Family Medicine

## 2021-06-07 DIAGNOSIS — I10 Essential (primary) hypertension: Secondary | ICD-10-CM

## 2021-06-09 ENCOUNTER — Other Ambulatory Visit: Payer: Self-pay | Admitting: *Deleted

## 2021-06-18 ENCOUNTER — Telehealth: Payer: Self-pay

## 2021-06-18 NOTE — Telephone Encounter (Signed)
Left message for patient to return call to the office to schedule annual wellness visit. Kathrene Alu RN

## 2021-06-25 ENCOUNTER — Ambulatory Visit (INDEPENDENT_AMBULATORY_CARE_PROVIDER_SITE_OTHER): Payer: Medicare Other | Admitting: Family Medicine

## 2021-06-25 DIAGNOSIS — Z0000 Encounter for general adult medical examination without abnormal findings: Secondary | ICD-10-CM

## 2021-06-25 NOTE — Patient Instructions (Signed)
Health Maintenance, Female Adopting a healthy lifestyle and getting preventive care are important in promoting health and wellness. Ask your health care provider about: The right schedule for you to have regular tests and exams. Things you can do on your own to prevent diseases and keep yourself healthy. What should I know about diet, weight, and exercise? Eat a healthy diet  Eat a diet that includes plenty of vegetables, fruits, low-fat dairy products, and lean protein. Do not eat a lot of foods that are high in solid fats, added sugars, or sodium. Maintain a healthy weight Body mass index (BMI) is used to identify weight problems. It estimates body fat based on height and weight. Your health care provider can help determine your BMI and help you achieve or maintain a healthy weight. Get regular exercise Get regular exercise. This is one of the most important things you can do for your health. Most adults should: Exercise for at least 150 minutes each week. The exercise should increase your heart rate and make you sweat (moderate-intensity exercise). Do strengthening exercises at least twice a week. This is in addition to the moderate-intensity exercise. Spend less time sitting. Even light physical activity can be beneficial. Watch cholesterol and blood lipids Have your blood tested for lipids and cholesterol at 81 years of age, then have this test every 5 years. Have your cholesterol levels checked more often if: Your lipid or cholesterol levels are high. You are older than 81 years of age. You are at high risk for heart disease. What should I know about cancer screening? Depending on your health history and family history, you may need to have cancer screening at various ages. This may include screening for: Breast cancer. Cervical cancer. Colorectal cancer. Skin cancer. Lung cancer. What should I know about heart disease, diabetes, and high blood pressure? Blood pressure and heart  disease High blood pressure causes heart disease and increases the risk of stroke. This is more likely to develop in people who have high blood pressure readings, are of African descent, or are overweight. Have your blood pressure checked: Every 3-5 years if you are 18-39 years of age. Every year if you are 40 years old or older. Diabetes Have regular diabetes screenings. This checks your fasting blood sugar level. Have the screening done: Once every three years after age 40 if you are at a normal weight and have a low risk for diabetes. More often and at a younger age if you are overweight or have a high risk for diabetes. What should I know about preventing infection? Hepatitis B If you have a higher risk for hepatitis B, you should be screened for this virus. Talk with your health care provider to find out if you are at risk for hepatitis B infection. Hepatitis C Testing is recommended for: Everyone born from 1945 through 1965. Anyone with known risk factors for hepatitis C. Sexually transmitted infections (STIs) Get screened for STIs, including gonorrhea and chlamydia, if: You are sexually active and are younger than 81 years of age. You are older than 81 years of age and your health care provider tells you that you are at risk for this type of infection. Your sexual activity has changed since you were last screened, and you are at increased risk for chlamydia or gonorrhea. Ask your health care provider if you are at risk. Ask your health care provider about whether you are at high risk for HIV. Your health care provider may recommend a prescription medicine   to help prevent HIV infection. If you choose to take medicine to prevent HIV, you should first get tested for HIV. You should then be tested every 3 months for as long as you are taking the medicine. Pregnancy If you are about to stop having your period (premenopausal) and you may become pregnant, seek counseling before you get  pregnant. Take 400 to 800 micrograms (mcg) of folic acid every day if you become pregnant. Ask for birth control (contraception) if you want to prevent pregnancy. Osteoporosis and menopause Osteoporosis is a disease in which the bones lose minerals and strength with aging. This can result in bone fractures. If you are 65 years old or older, or if you are at risk for osteoporosis and fractures, ask your health care provider if you should: Be screened for bone loss. Take a calcium or vitamin D supplement to lower your risk of fractures. Be given hormone replacement therapy (HRT) to treat symptoms of menopause. Follow these instructions at home: Lifestyle Do not use any products that contain nicotine or tobacco, such as cigarettes, e-cigarettes, and chewing tobacco. If you need help quitting, ask your health care provider. Do not use street drugs. Do not share needles. Ask your health care provider for help if you need support or information about quitting drugs. Alcohol use Do not drink alcohol if: Your health care provider tells you not to drink. You are pregnant, may be pregnant, or are planning to become pregnant. If you drink alcohol: Limit how much you use to 0-1 drink a day. Limit intake if you are breastfeeding. Be aware of how much alcohol is in your drink. In the [place] one drink equals one 12 oz bottle of beer (355 mL), one 5 oz glass of wine (148 mL), or one 1 oz glass of hard liquor (44 mL). General instructions Schedule regular health, dental, and eye exams. Stay current with your vaccines. Tell your health care provider if: You often feel depressed. You have ever been abused or do not feel safe at home. Summary Adopting a healthy lifestyle and getting preventive care are important in promoting health and wellness. Follow your health care provider's instructions about healthy diet, exercising, and getting tested or screened for diseases. Follow your health care provider's  instructions on monitoring your cholesterol and blood pressure. This information is not intended to replace advice given to you by your health care provider. Make sure you discuss any questions you have with your health care provider. Document Revised: 12/03/2020 Document Reviewed: 09/18/2018 Elsevier Patient Education  2022 [employer]  

## 2021-06-25 NOTE — Progress Notes (Signed)
Subjective:  I connected with  [name] on 06/25/21 by a video enabled telemedicine application and verified that I am speaking with the correct person using two identifiers.   I discussed the limitations of evaluation and management by telemedicine. The patient expressed understanding and agreed to proceed. Location of patient: Home  Location of provider:Office  Persons participating in visit: [name] and [place] is a 80 y.o. female who presents for an Initial Medicare Annual Wellness Visit.  Review of Systems    Defer to provider.       Objective:    There were no vitals filed for this visit. There is no height or weight on file to calculate BMI.  Advanced Directives 06/25/2021 03/31/2021 04/18/2018 09/20/2016 12/13/2015 08/20/2014 06/27/2013  Does Patient Have a Medical Advance Directive? No No No No No No Patient does not have advance directive  Would patient like information on creating a medical advance directive? Yes (Inpatient - patient requests chaplain consult to create a medical advance directive) No - Patient declined Yes (MAU/Ambulatory/Procedural Areas - Information given) - - - -    Current Medications (verified) Outpatient Encounter Medications as of 06/25/2021  Medication Sig   acetaminophen (TYLENOL) 500 MG tablet Take 500 mg by mouth every 6 (six) hours as needed for pain.    amLODipine (NORVASC) 10 MG tablet Take 1 tablet by mouth once daily   BIOTIN FORTE PO Take 1 tablet by mouth daily.   CALCIUM PO Take 2 tablets by mouth daily.   cephALEXin (KEFLEX) 250 MG capsule Take 250 mg by mouth daily.   Cholecalciferol (VITAMIN D) 1000 UNITS capsule Take 1,000 Units by mouth daily.    colestipol (COLESTID) 1 g tablet Take 1 tablet (1 g total) by mouth 2 (two) times daily.   glucosamine-chondroitin 500-400 MG tablet Take 2 tablets by mouth daily.    levothyroxine (SYNTHROID) 25 MCG tablet Take 1 tablet (25 mcg total) by mouth  daily.   loratadine (CLARITIN) 10 MG tablet Take 10 mg by mouth daily.     meclizine (ANTIVERT) 25 MG tablet Take 1 tablet (25 mg total) by mouth 3 (three) times daily as needed for dizziness.   metoprolol succinate (TOPROL-XL) 25 MG 24 hr tablet Take 1 tablet by mouth once daily   Multiple Vitamin (MULTIVITAMIN) capsule Take 1 capsule by mouth daily.     pantoprazole (PROTONIX) 40 MG tablet Take 1 tablet by mouth once daily   Polyvinyl Alcohol-Povidone (CLEAR EYES ALL SEASONS OP) Apply 1 drop to eye daily as needed (dry eyes).   Probiotic Product (ALIGN PO) Take by mouth.   pyridOXINE (VITAMIN B-6) 100 MG tablet Take 100 mg by mouth daily.     raloxifene (EVISTA) 60 MG tablet Take 1 tablet (60 mg total) by mouth daily.   cycloSPORINE (RESTASIS) 0.05 % ophthalmic emulsion 1 drop 2 (two) times daily. (Patient not taking: Reported on 06/25/2021)   fish oil-omega-3 fatty acids 1000 MG capsule Take 2 g by mouth daily.    No facility-administered encounter medications on file as of 06/25/2021.    Allergies (verified) Advair diskus [fluticasone-salmeterol], Aspirin, Bactrim [sulfamethoxazole-trimethoprim], Demerol, Meperidine, and Qvar [beclomethasone]   History: Past Medical History:  Diagnosis Date   Arthritis    knees, hips   Breast cancer [facility]) August 2011   left - lumpectomy   Cataract    GERD (gastroesophageal reflux disease)    minor - tx with zantac   H/O bladder  infections    Headache(784.0)    otc med prn   Heart murmur    slight murmur -never had any problems   Hernia    Hypertension    no meds x 3 yrs   Hypothyroidism    Osteoporosis    Pneumonia    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- Dr  Elsworth Soho   Thyroid disease    Ulcer    Urinary incontinence    Past Surgical History:  Procedure Laterality Date   APPENDECTOMY     BREAST LUMPECTOMY     left   EYE SURGERY     left- cataract    HIATAL HERNIA REPAIR     x 2   HYSTEROSCOPY WITH D & C N/A  07/07/2013   Procedure: DILATATION AND CURETTAGE /HYSTEROSCOPY;  Surgeon: Anastasio Auerbach, MD;  Location: [facility];  Service: Gynecology;  Laterality: N/A;   KNEE SURGERY     Arthroscopic   left cataract surgery     TONSILLECTOMY     Family History  Problem Relation Age of Onset   Rheum arthritis Mother    Heart disease Maternal Grandmother    Prostate cancer Maternal Grandfather    Cancer Paternal Grandfather        Stomach cancer   Breast cancer Maternal Aunt 70   Pancreatic cancer Paternal Aunt    Cancer Paternal Aunt    Social History   Socioeconomic History   Marital status: Widowed    Spouse name: Not on file   Number of children: 0   Years of education: Not on file   Highest education level: Not on file  Occupational History   Occupation: Retired     Comment: [employer]  Tobacco Use   Smoking status: Never   Smokeless tobacco: Never  Vaping Use   Vaping Use: Never used  Substance and Sexual Activity   Alcohol use: Never   Drug use: Never   Sexual activity: Not Currently    Birth control/protection: Post-menopausal  Other Topics Concern   Not on file  Social History Narrative   Not on file   Social Determinants of Health   Financial Resource Strain: Not on file  Food Insecurity: Not on file  Transportation Needs: Not on file  Physical Activity: Inactive   Days of Exercise per Week: 0 days   Minutes of Exercise per Session: 0 min  Stress: Not on file  Social Connections: Not on file    Tobacco Counseling Counseling given: Not Answered   Clinical Intake:  Pre-visit preparation completed: Yes  Pain : No/denies pain     Nutritional Risks: None Diabetes: No  How often do you need to have someone help you when you read instructions, pamphlets, or other written materials from your doctor or pharmacy?: 2 - Rarely What is the last grade level you completed in school?: 12th  Diabetic?No  Interpreter Needed?: No  Information entered by ::  [place] of Daily Living In your present state of health, do you have any difficulty performing the following activities: 06/25/2021  Hearing? N  Vision? N  Comment Patient wear glasses  Difficulty concentrating or making decisions? N  Walking or climbing stairs? Y  Dressing or bathing? N  Doing errands, shopping? Y  Comment Most of the time a family/ friend per pt  Some recent data might be hidden    Patient Care Team: [name], MD as PCP - General (Family Medicine) Wilber Bihari  Cornetto, NP as Nurse Practitioner (Hematology and Oncology) Pyrtle, Lajuan Lines, MD as Consulting Physician (Gastroenterology) Bjorn Loser, MD as Consulting Physician (Urology)  Indicate any recent Medical Services you may have received from other than [facility] providers in the past year (date may be approximate).     Assessment:   This is a routine wellness examination for [place].  Hearing/Vision screen Hearing Screening - Comments:: Does not wear hearing aids and no hearing concerns. Vision Screening - Comments:: Wear eye glasses  Dietary issues and exercise activities discussed:     Goals Addressed   None   Depression Screen PHQ 2/9 Scores 06/25/2021 03/31/2021 09/27/2017  PHQ - 2 Score 0 0 0    Fall Risk Fall Risk  06/25/2021 09/27/2017 08/20/2014  Falls in the past year? 0 No No  Number falls in past yr: 0 - -  Injury with Fall? 0 - -  Risk for fall due to : History of fall(s) - -  Follow up Falls evaluation completed;Education provided;Falls prevention discussed - -    FALL RISK PREVENTION PERTAINING TO THE HOME:  Any stairs in or around the home? Yes  but a ramp is covering them. If so, are there any without handrails? No  Home free of loose throw rugs in walkways, pet beds, electrical cords, etc? Yes  Adequate lighting in your home to reduce risk of falls? Yes   ASSISTIVE DEVICES UTILIZED TO PREVENT FALLS:  Life alert? No  Use of a cane, walker  or w/c? Yes  Grab bars in the bathroom? No  Shower chair or bench in shower? No  Elevated toilet seat or a handicapped toilet? Yes   TIMED UP AND GO:  Was the test performed? No .  Length of time to ambulate 10 feet: N/A sec.   Gait slow and steady with assistive device  Cognitive Function:     6CIT Screen 06/25/2021  What Year? 0 points  What month? 0 points  What time? 0 points  Count back from 20 0 points  Months in reverse 0 points  Repeat phrase 2 points  Total Score 2    Immunizations Immunization History  Administered Date(s) Administered   Fluad Quad(high Dose 65+) 06/03/2019, 07/30/2020   Influenza Split 07/26/2011   Influenza Whole 08/07/2010, 08/09/2012   Influenza, High Dose Seasonal PF 09/24/2015, 07/06/2016, 07/16/2018   Influenza,inj,Quad PF,6+ Mos 07/17/2013, 09/18/2014   Influenza-Unspecified 08/28/2017   PFIZER(Purple Top)SARS-COV-2 Vaccination 04/07/2020, 05/08/2020, 09/15/2020   Pneumococcal Conjugate-13 11/06/2013   Pneumococcal Polysaccharide-23 11/09/2011    TDAP status: Due, Education has been provided regarding the importance of this vaccine. Advised may receive this vaccine at local pharmacy or Health Dept. Aware to provide a copy of the vaccination record if obtained from local pharmacy or Health Dept. Verbalized acceptance and understanding.  Flu Vaccine status: Due, Education has been provided regarding the importance of this vaccine. Advised may receive this vaccine at local pharmacy or Health Dept. Aware to provide a copy of the vaccination record if obtained from local pharmacy or Health Dept. Verbalized acceptance and understanding.  Pneumococcal vaccine status: Up to date  Covid-19 vaccine status: Completed vaccines  Qualifies for Shingles Vaccine? Yes   Zostavax completed No   Shingrix Completed?: No.    Education has been provided regarding the importance of this vaccine. Patient has been advised to call insurance company to  determine out of pocket expense if they have not yet received this vaccine. Advised may also receive vaccine at local pharmacy or  Health Dept. Verbalized acceptance and understanding.  Screening Tests Health Maintenance  Topic Date Due   TETANUS/TDAP  06/27/2021 (Originally 08/20/1960)   COVID-19 Vaccine (4 - Booster for Orange series) 06/27/2021 (Originally 12/08/2020)   Zoster Vaccines- Shingrix (1 of 2) 06/27/2021 (Originally 08/20/1960)   INFLUENZA VACCINE  01/06/2022 (Originally 05/09/2021)   MAMMOGRAM  10/21/2021   DEXA SCAN  Completed   HPV VACCINES  Aged Out    Health Maintenance  There are no preventive care reminders to display for this patient.   Colorectal cancer screening: No longer required.   Mammogram status: Completed 10/21/2020. Repeat every year  Bone Density status: Completed 10/08/2019. Results reflect: Bone density results: OSTEOPOROSIS. Repeat every 2 years.  Lung Cancer Screening: (Low Dose CT Chest recommended if Age 69-80 years, 30 pack-year currently smoking OR have quit w/in 15years.) does not qualify.   Lung Cancer Screening Referral: N/A  Additional Screening:  Hepatitis C Screening: does not qualify; Completed N/A  Vision Screening: Recommended annual ophthalmology exams for early detection of glaucoma and other disorders of the eye. Is the patient up to date with their annual eye exam?  Yes  Who is the provider or what is the name of the office in which the patient attends annual eye exams? Dr. Eulas Post, Next eye exam is 07/04/2021. If pt is not established with a provider, would they like to be referred to a provider to establish care? No .   Dental Screening: Recommended annual dental exams for proper oral hygiene.  Community Resource Referral / Chronic Care Management: CRR required this visit?  No   CCM required this visit?  No      Plan:     I have personally reviewed and noted the following in the patient's chart:   Medical and social  history Use of alcohol, tobacco or illicit drugs  Current medications and supplements including opioid prescriptions. Patient is not currently taking opioid prescriptions. Functional ability and status Nutritional status Physical activity Advanced directives List of other physicians Hospitalizations, surgeries, and ER visits in previous 12 months Vitals Screenings to include cognitive, depression, and falls Referrals and appointments  In addition, I have reviewed and discussed with patient certain preventive protocols, quality metrics, and best practice recommendations. A written personalized care plan for preventive services as well as general preventive health recommendations were provided to patient.     [place], [place]   06/25/2021   Nurse Notes: Non-Face to Face 42 minutes visit  Ms. Bjorn Loser , Thank you for taking time to come for your Medicare Wellness Visit. I appreciate your ongoing commitment to your health goals. Please review the following plan we discussed and let me know if I can assist you in the future.   These are the goals we discussed:  Goals   None     This is a list of the screening recommended for you and due dates:  Health Maintenance  Topic Date Due   Tetanus Vaccine  06/27/2021*   COVID-19 Vaccine (4 - Booster for Pfizer series) 06/27/2021*   Zoster (Shingles) Vaccine (1 of 2) 06/27/2021*   Flu Shot  01/06/2022*   Mammogram  10/21/2021   DEXA scan (bone density measurement)  Completed   HPV Vaccine  Aged Out  *Topic was postponed. The date shown is not the original due date.

## 2021-07-19 ENCOUNTER — Other Ambulatory Visit: Payer: Self-pay

## 2021-07-19 ENCOUNTER — Ambulatory Visit (INDEPENDENT_AMBULATORY_CARE_PROVIDER_SITE_OTHER): Payer: Medicare Other | Admitting: Family Medicine

## 2021-07-19 VITALS — BP 120/82 | HR 67 | Temp 97.2°F | Ht 61.0 in | Wt 189.0 lb

## 2021-07-19 DIAGNOSIS — Z23 Encounter for immunization: Secondary | ICD-10-CM

## 2021-07-19 DIAGNOSIS — M810 Age-related osteoporosis without current pathological fracture: Secondary | ICD-10-CM

## 2021-07-19 DIAGNOSIS — I10 Essential (primary) hypertension: Secondary | ICD-10-CM | POA: Diagnosis not present

## 2021-07-19 DIAGNOSIS — E039 Hypothyroidism, unspecified: Secondary | ICD-10-CM

## 2021-07-19 DIAGNOSIS — Z0000 Encounter for general adult medical examination without abnormal findings: Secondary | ICD-10-CM | POA: Diagnosis not present

## 2021-07-19 MED ORDER — PANTOPRAZOLE SODIUM 40 MG PO TBEC: 40.0000 mg | DELAYED_RELEASE_TABLET | Freq: Every day | ORAL | 0 refills | Status: DC

## 2021-07-19 MED ORDER — METOPROLOL SUCCINATE ER 25 MG PO TB24: 25.0000 mg | ORAL_TABLET | Freq: Every day | ORAL | 3 refills | Status: DC

## 2021-07-19 MED ORDER — LEVOTHYROXINE SODIUM 25 MCG PO TABS: 25.0000 ug | ORAL_TABLET | Freq: Every day | ORAL | 3 refills | Status: DC

## 2021-07-19 MED ORDER — DICYCLOMINE HCL 10 MG PO CAPS: 10.0000 mg | ORAL_CAPSULE | Freq: Four times a day (QID) | ORAL | 0 refills | Status: DC | PRN

## 2021-07-19 MED ORDER — RALOXIFENE HCL 60 MG PO TABS: 60.0000 mg | ORAL_TABLET | Freq: Every day | ORAL | 3 refills | Status: DC

## 2021-07-19 MED ORDER — COLESTIPOL HCL 1 G PO TABS: 1.0000 g | ORAL_TABLET | Freq: Two times a day (BID) | ORAL | 3 refills | Status: DC

## 2021-07-19 NOTE — Progress Notes (Signed)
Subjective:    Patient ID: Joan Weaver, female    DOB: April 23, 1941, 80 y.o.   MRN: [id number]  HPI  Patient is here today for a checkup.  Patient's mammogram was performed in January and was normal.  She has a history of osteoporosis and is currently on Evista.  She is due to recheck that.  Due to her age she does not require Pap smear or colonoscopy.  Her immunizations are up-to-date.  She would like to receive her flu shot today.  She has not yet had her COVID booster.  She has not had a shingles booster however she had shingles 2 years ago.  She denies any falls, depression, or memory loss. 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- Dr  Elsworth Soho   Thyroid disease    Ulcer    Urinary incontinence    Past Surgical History:  Procedure Laterality Date   APPENDECTOMY     BREAST LUMPECTOMY     left   EYE SURGERY     left- cataract    HIATAL HERNIA REPAIR     x 2   HYSTEROSCOPY WITH D & C N/A 07/07/2013   Procedure: DILATATION AND CURETTAGE /HYSTEROSCOPY;  Surgeon: Anastasio Auerbach, MD;  Location: [facility];  Service: Gynecology;  Laterality: N/A;   KNEE SURGERY     Arthroscopic   left cataract surgery     TONSILLECTOMY     Current Outpatient Medications on File Prior to Visit  Medication Sig Dispense Refill   acetaminophen (TYLENOL) 500 MG tablet Take 500 mg by mouth every 6 (six) hours as needed for pain.      amLODipine (NORVASC) 10 MG tablet Take 1 tablet by mouth once daily 90 tablet 0   BIOTIN FORTE PO Take 1 tablet by mouth daily.     CALCIUM PO Take 2  tablets by mouth daily.     cephALEXin (KEFLEX) 250 MG capsule Take 250 mg by mouth daily.     Cholecalciferol (VITAMIN D) 1000 UNITS capsule Take 1,000 Units by mouth daily.      colestipol (COLESTID) 1 g tablet Take 1 tablet (1 g total) by mouth 2 (two) times daily. 180 tablet 3   cycloSPORINE (RESTASIS) 0.05 % ophthalmic emulsion 1 drop 2 (two) times daily.     fish oil-omega-3 fatty acids 1000 MG capsule Take 2 g by mouth daily.      glucosamine-chondroitin 500-400 MG tablet Take 2 tablets by mouth daily.      levothyroxine (SYNTHROID) 25 MCG tablet Take 1 tablet (25 mcg total) by mouth daily. 90 tablet 3   loratadine (CLARITIN) 10 MG tablet Take 10 mg by mouth daily.       metoprolol succinate (TOPROL-XL) 25 MG 24 hr tablet Take 1 tablet by mouth once daily 90 tablet 0   Multiple Vitamin (MULTIVITAMIN) capsule Take 1 capsule by mouth daily.  pantoprazole (PROTONIX) 40 MG tablet Take 1 tablet by mouth once daily 90 tablet 0   Polyvinyl Alcohol-Povidone (CLEAR EYES ALL SEASONS OP) Apply 1 drop to eye daily as needed (dry eyes).     Probiotic Product (ALIGN PO) Take by mouth.     pyridOXINE (VITAMIN B-6) 100 MG tablet Take 100 mg by mouth daily.       raloxifene (EVISTA) 60 MG tablet Take 1 tablet (60 mg total) by mouth daily. 90 tablet 3   meclizine (ANTIVERT) 25 MG tablet Take 1 tablet (25 mg total) by mouth 3 (three) times daily as needed for dizziness. (Patient not taking: Reported on 07/19/2021) 30 tablet 1   No current facility-administered medications on file prior to visit.      Review of Systems  All other systems reviewed and are negative.     Objective:   Physical Exam Vitals reviewed.  Constitutional:      General: She is not in acute distress.    Appearance: She is obese. She is not ill-appearing or toxic-appearing.  Cardiovascular:     Rate and Rhythm: Normal rate and regular rhythm.     Pulses: Normal pulses.     Heart sounds: Normal heart sounds. No murmur  heard. Pulmonary:     Effort: Pulmonary effort is normal. No respiratory distress.     Breath sounds: Normal breath sounds. No stridor. No wheezing, rhonchi or rales.  Abdominal:     General: Abdomen is flat. Bowel sounds are normal. There is no distension.     Palpations: Abdomen is soft.     Tenderness: There is no abdominal tenderness. There is no guarding.  Musculoskeletal:     Right lower leg: No edema.     Left lower leg: No edema.  Neurological:     Mental Status: She is alert.          Assessment & Plan:  Hypothyroidism, unspecified type - Plan: CBC with Differential/Platelet, COMPLETE METABOLIC PANEL WITH GFR, TSH  Essential hypertension - Plan: metoprolol succinate (TOPROL-XL) 25 MG 24 hr tablet, CBC with Differential/Platelet, COMPLETE METABOLIC PANEL WITH GFR, TSH  Osteoporosis, unspecified osteoporosis type, unspecified pathological fracture presence - Plan: DG Bone Density  Immunization due - Plan: Flu Vaccine QUAD High Dose(Fluad) Physical exam today is significant only for dyspnea on exertion which is a chronic finding from her.  Given her history of hypothyroidism, I will check a TSH.  Her blood pressure today is well controlled at 120/82.  Therefore we will continue Toprol-XL 25 mg daily.  I will not check a fasting lipid panel because the patient is not fasting.  Her last cholesterol last year was excellent and therefore I do not see the utility in checking that again in a patient 80 years old with a previous history of well-controlled cholesterol.  Given her history of osteoporosis I will schedule a bone density test.  She received a flu shot.  I recommended the COVID booster.  Also recommended the shingles vaccine.  She denies any falls or depression or memory loss

## 2021-07-20 LAB — CBC WITH DIFFERENTIAL/PLATELET
Absolute Monocytes: 604 cells/uL (ref 200–950)
Basophils Absolute: 73 cells/uL (ref 0–200)
Basophils Relative: 1.2 %
Eosinophils Absolute: 98 cells/uL (ref 15–500)
Eosinophils Relative: 1.6 %
HCT: 42.9 % (ref 35.0–45.0)
Hemoglobin: 14.2 g/dL (ref 11.7–15.5)
Lymphs Abs: 1848 cells/uL (ref 850–3900)
MCH: 29.2 pg (ref 27.0–33.0)
MCHC: 33.1 g/dL (ref 32.0–36.0)
MCV: 88.3 fL (ref 80.0–100.0)
MPV: 10.8 fL (ref 7.5–12.5)
Monocytes Relative: 9.9 %
Neutro Abs: 3477 cells/uL (ref 1500–7800)
Neutrophils Relative %: 57 %
Platelets: 264 10*3/uL (ref 140–400)
RBC: 4.86 10*6/uL (ref 3.80–5.10)
RDW: 12.8 % (ref 11.0–15.0)
Total Lymphocyte: 30.3 %
WBC: 6.1 10*3/uL (ref 3.8–10.8)

## 2021-07-20 LAB — COMPLETE METABOLIC PANEL WITH GFR
AG Ratio: 1.5 (calc) (ref 1.0–2.5)
ALT: 15 U/L (ref 6–29)
AST: 20 U/L (ref 10–35)
Albumin: 4 g/dL (ref 3.6–5.1)
Alkaline phosphatase (APISO): 53 U/L (ref 37–153)
BUN/Creatinine Ratio: 17 (calc) (ref 6–22)
BUN: 17 mg/dL (ref 7–25)
CO2: 25 mmol/L (ref 20–32)
Calcium: 9.6 mg/dL (ref 8.6–10.4)
Chloride: 106 mmol/L (ref 98–110)
Creat: 0.98 mg/dL — ABNORMAL HIGH (ref 0.60–0.95)
Globulin: 2.7 g/dL (calc) (ref 1.9–3.7)
Glucose, Bld: 99 mg/dL (ref 65–99)
Potassium: 4.4 mmol/L (ref 3.5–5.3)
Sodium: 141 mmol/L (ref 135–146)
Total Bilirubin: 0.4 mg/dL (ref 0.2–1.2)
Total Protein: 6.7 g/dL (ref 6.1–8.1)
eGFR: 58 mL/min/{1.73_m2} — ABNORMAL LOW (ref >=60)

## 2021-07-20 LAB — TSH: TSH: 2.25 mIU/L (ref 0.40–4.50)

## 2021-07-21 ENCOUNTER — Encounter: Payer: Self-pay | Admitting: *Deleted

## 2021-08-01 DIAGNOSIS — H3561 Retinal hemorrhage, right eye: Secondary | ICD-10-CM | POA: Diagnosis not present

## 2021-08-01 DIAGNOSIS — H31092 Other chorioretinal scars, left eye: Secondary | ICD-10-CM | POA: Diagnosis not present

## 2021-08-01 DIAGNOSIS — H04123 Dry eye syndrome of bilateral lacrimal glands: Secondary | ICD-10-CM | POA: Diagnosis not present

## 2021-08-01 DIAGNOSIS — H26493 Other secondary cataract, bilateral: Secondary | ICD-10-CM | POA: Diagnosis not present

## 2021-08-12 ENCOUNTER — Other Ambulatory Visit: Payer: Self-pay | Admitting: *Deleted

## 2021-08-12 MED ORDER — LEVOTHYROXINE SODIUM 25 MCG PO TABS: 25.0000 ug | ORAL_TABLET | Freq: Every day | ORAL | 3 refills | Status: DC

## 2021-08-16 ENCOUNTER — Encounter: Payer: Medicare Other | Admitting: Family Medicine

## 2021-09-08 ENCOUNTER — Other Ambulatory Visit: Payer: Self-pay | Admitting: Family Medicine

## 2021-09-08 DIAGNOSIS — I10 Essential (primary) hypertension: Secondary | ICD-10-CM

## 2021-10-09 DIAGNOSIS — K224 Dyskinesia of esophagus: Secondary | ICD-10-CM

## 2021-10-09 HISTORY — DX: Dyskinesia of esophagus: K22.4

## 2021-10-27 DIAGNOSIS — R350 Frequency of micturition: Secondary | ICD-10-CM | POA: Diagnosis not present

## 2021-12-09 ENCOUNTER — Other Ambulatory Visit: Payer: Self-pay | Admitting: Family Medicine

## 2021-12-09 DIAGNOSIS — I10 Essential (primary) hypertension: Secondary | ICD-10-CM

## 2021-12-29 ENCOUNTER — Telehealth: Payer: Self-pay | Admitting: Family Medicine

## 2021-12-29 NOTE — Telephone Encounter (Signed)
Patient called to request refill of  ? ?meclizine (ANTIVERT) 25 MG tablet [[id number]]  ? ?Experiencing vertigo since late yesterday afternoon. ? ?Although I advised patient to make an appointment, patient requesting for provider to send in refill without one. ? ?Pharmacy confirmed as  ? ?[facility] ([place] - [address]  ?[address], [place] ([place]) [place] [address]  ?Phone:  [phone]  Fax:  [phone]  ? ?Patient requesting call back. Please advise at [phone]. ?

## 2021-12-30 ENCOUNTER — Other Ambulatory Visit: Payer: Self-pay

## 2021-12-30 MED ORDER — MECLIZINE HCL 25 MG PO TABS: 25.0000 mg | ORAL_TABLET | Freq: Three times a day (TID) | ORAL | 1 refills | Status: AC | PRN

## 2021-12-30 NOTE — Telephone Encounter (Signed)
Patient called again to check on status of refill request. Patient concerned because she lives alone and doesn't want to fall; stated her head is very  swimmy when she turns it and she doesn't want to go through the weekend in that condition. Requesting refill before the end of the day. ? ?Please advise at [phone]. ?

## 2021-12-30 NOTE — Telephone Encounter (Signed)
Rx sent to pharmacy   

## 2022-01-11 ENCOUNTER — Other Ambulatory Visit: Payer: Self-pay | Admitting: Family Medicine

## 2022-02-22 ENCOUNTER — Telehealth: Payer: Self-pay | Admitting: Adult Health

## 2022-02-22 NOTE — Telephone Encounter (Signed)
Rescheduled appointment per provider template. Patient is aware of the changes made to her upcoming appointment 

## 2022-03-01 ENCOUNTER — Other Ambulatory Visit: Payer: Self-pay

## 2022-03-01 ENCOUNTER — Telehealth: Payer: Self-pay

## 2022-03-01 DIAGNOSIS — M810 Age-related osteoporosis without current pathological fracture: Secondary | ICD-10-CM

## 2022-03-01 NOTE — Telephone Encounter (Signed)
[facility] called asking for order for pt to get done dexa done for pt tom. Pt appt is at 1pm.  Fax [phone]  Orders print put on Dr. Samella Parr desk to be sign

## 2022-03-02 DIAGNOSIS — M85832 Other specified disorders of bone density and structure, left forearm: Secondary | ICD-10-CM | POA: Diagnosis not present

## 2022-03-02 DIAGNOSIS — M810 Age-related osteoporosis without current pathological fracture: Secondary | ICD-10-CM | POA: Diagnosis not present

## 2022-03-02 DIAGNOSIS — Z780 Asymptomatic menopausal state: Secondary | ICD-10-CM | POA: Diagnosis not present

## 2022-03-02 DIAGNOSIS — Z1231 Encounter for screening mammogram for malignant neoplasm of breast: Secondary | ICD-10-CM | POA: Diagnosis not present

## 2022-03-02 LAB — HM MAMMOGRAPHY: HM Mammogram: NORMAL (ref 0–4)

## 2022-03-02 LAB — HM DEXA SCAN

## 2022-03-02 NOTE — Telephone Encounter (Signed)
Successfully faxed to [phone] with a confirmation time stamp of 05.25 07:37.

## 2022-03-07 ENCOUNTER — Encounter: Payer: Self-pay | Admitting: Family Medicine

## 2022-03-09 ENCOUNTER — Other Ambulatory Visit: Payer: Self-pay | Admitting: Family Medicine

## 2022-03-09 DIAGNOSIS — I10 Essential (primary) hypertension: Secondary | ICD-10-CM

## 2022-03-10 ENCOUNTER — Encounter: Payer: Self-pay | Admitting: Family Medicine

## 2022-03-10 ENCOUNTER — Other Ambulatory Visit: Payer: Self-pay

## 2022-03-10 NOTE — Telephone Encounter (Signed)
Requested medication (s) are due for refill today: yes  Requested medication (s) are on the active medication list: yes  Last refill:  12/12/21 #90 with 0 RF  Future visit scheduled: no, last visit 07/19/21  Notes to clinic:  Failed protocol due to no valid visit within 6  months, please assess.    Requested Prescriptions  Pending Prescriptions Disp Refills   amLODipine (NORVASC) 10 MG tablet [Pharmacy Med Name: amLODIPine Besylate 10 MG Oral Tablet] 90 tablet 0    Sig: Take 1 tablet by mouth once daily     Cardiovascular: Calcium Channel Blockers 2 Failed - 03/09/2022  7:17 PM      Failed - Valid encounter within last 6 months    Recent Outpatient Visits           7 months ago Hypothyroidism, unspecified type   [place] Pickard, Cammie Mcgee, MD   8 months ago Encounter for [employer] annual wellness exam   [place] Pickard, Cammie Mcgee, MD   1 year ago Dry mouth, unspecified   [place] [name], Cammie Mcgee, MD   2 years ago Benign essential HTN   [place] [name], MD   4 years ago Benign essential HTN   [place], Cammie Mcgee, MD               Passed - Last BP in normal range    BP Readings from Last 1 Encounters:  07/19/21 120/82         Passed - Last Heart Rate in normal range    Pulse Readings from Last 1 Encounters:  07/19/21 67

## 2022-03-10 NOTE — Telephone Encounter (Signed)
Called pt with results of  dexa scan  pt is willing to Fosamax 70 mg once a week . Pt also wanted to know the results of her mammogram  informed pt that her results were normal.

## 2022-03-13 NOTE — Telephone Encounter (Signed)
Please let the patient know that Dr. Samella Parr ultrasound and this can be scheduled once he gets back but make sure she gets all dental work or anything she needs to get done for dental work done before she starts this kind of medicine.

## 2022-03-14 ENCOUNTER — Other Ambulatory Visit: Payer: Self-pay

## 2022-03-14 DIAGNOSIS — I10 Essential (primary) hypertension: Secondary | ICD-10-CM

## 2022-03-14 MED ORDER — AMLODIPINE BESYLATE 10 MG PO TABS: 10.0000 mg | ORAL_TABLET | Freq: Every day | ORAL | 0 refills | Status: DC

## 2022-03-14 NOTE — Telephone Encounter (Signed)
Pharmacy faxed a refill request for amLODipine (NORVASC) 10 MG tablet [[id number]]    Order Details Dose, Route, Frequency: As Directed  Dispense Quantity: 90 tablet Refills: 0        Sig: Take 1 tablet by mouth once daily       Start Date: 12/12/21 End Date: --  Written Date: 12/12/21 Expiration Date: 12/12/22

## 2022-03-14 NOTE — Telephone Encounter (Signed)
Pt called in to inquire about getting the refill of this med amLODipine (NORVASC) 10 MG tablet [[id number]]. Pt states that she has received her other bp meds and this med is needed to help with other bp meds. Pt is aware that an ov may be needed, but doesn't understand that her other meds have been refilled and this one has not. Pt states that she is almost out of this med. Please advise.  Cb#: [phone]

## 2022-03-14 NOTE — Telephone Encounter (Signed)
rx was sent to pharmacy today by NT #90/0 E-Prescribing Status: Receipt confirmed by pharmacy (03/14/2022 3:35 PM EDT) Requested Prescriptions  Pending Prescriptions Disp Refills  . amLODipine (NORVASC) 10 MG tablet 90 tablet 0    Sig: Take 1 tablet (10 mg total) by mouth daily.     Cardiovascular: Calcium Channel Blockers 2 Failed - 03/14/2022  2:40 PM      Failed - Valid encounter within last 6 months    Recent Outpatient Visits          7 months ago Hypothyroidism, unspecified type   [place] Joan Weaver, Joan Mcgee, MD   8 months ago Encounter for [employer] annual wellness exam   [place] Joan Weaver, Joan Mcgee, MD   1 year ago Dry mouth, unspecified   [place] [name], Joan Mcgee, MD   2 years ago Benign essential HTN   [place] [name], MD   4 years ago Benign essential HTN   [place] Joan Weaver, Joan Mcgee, MD             Passed - Last BP in normal range    BP Readings from Last 1 Encounters:  07/19/21 120/82         Passed - Last Heart Rate in normal range    Pulse Readings from Last 1 Encounters:  07/19/21 67

## 2022-03-14 NOTE — Telephone Encounter (Signed)
Requested Prescriptions  Pending Prescriptions Disp Refills  . amLODipine (NORVASC) 10 MG tablet 90 tablet 0    Sig: Take 1 tablet (10 mg total) by mouth daily.     Cardiovascular: Calcium Channel Blockers 2 Failed - 03/14/2022 11:17 AM      Failed - Valid encounter within last 6 months    Recent Outpatient Visits          7 months ago Hypothyroidism, unspecified type   [place] Pickard, Cammie Mcgee, MD   8 months ago Encounter for [employer] annual wellness exam   [place] Pickard, Cammie Mcgee, MD   1 year ago Dry mouth, unspecified   [name], Cammie Mcgee, MD   2 years ago Benign essential HTN   [place] [name], MD   4 years ago Benign essential HTN   [place] Pickard, Cammie Mcgee, MD             Passed - Last BP in normal range    BP Readings from Last 1 Encounters:  07/19/21 120/82         Passed - Last Heart Rate in normal range    Pulse Readings from Last 1 Encounters:  07/19/21 67

## 2022-03-15 MED ORDER — ALENDRONATE SODIUM 70 MG PO TABS: 70.0000 mg | ORAL_TABLET | ORAL | 0 refills | Status: DC

## 2022-04-03 ENCOUNTER — Encounter: Payer: Medicare Other | Admitting: Adult Health

## 2022-04-14 ENCOUNTER — Inpatient Hospital Stay: Payer: Medicare Other | Attending: Adult Health | Admitting: Adult Health

## 2022-04-14 ENCOUNTER — Other Ambulatory Visit: Payer: Self-pay

## 2022-04-14 ENCOUNTER — Encounter: Payer: Self-pay | Admitting: Adult Health

## 2022-04-14 ENCOUNTER — Telehealth: Payer: Self-pay | Admitting: Surgery

## 2022-04-14 VITALS — BP 144/65 | HR 70 | Temp 97.3°F | Wt 186.2 lb

## 2022-04-14 DIAGNOSIS — Z923 Personal history of irradiation: Secondary | ICD-10-CM | POA: Diagnosis not present

## 2022-04-14 DIAGNOSIS — C50512 Malignant neoplasm of lower-outer quadrant of left female breast: Secondary | ICD-10-CM

## 2022-04-14 DIAGNOSIS — Z853 Personal history of malignant neoplasm of breast: Secondary | ICD-10-CM | POA: Diagnosis not present

## 2022-04-14 DIAGNOSIS — Z170 Estrogen receptor positive status [ER+]: Secondary | ICD-10-CM

## 2022-04-14 NOTE — Progress Notes (Signed)
[facility] Follow up:    Joan Frizzle, MD [address] [place] [address]   DIAGNOSIS: history of left breast cancer  SUMMARY OF ONCOLOGIC HISTORY: Oncology History  Breast cancer, left ([place])  05/11/2010 Surgery   Left breast lumpectomy: Invasive ductal carcinoma 6 mm, grade 2, margins negative, 0/2 sentinel nodes, ER 98%, PR 97%, HER-2 negative, Ki-67 18%, T1b N0 M0 stage IA   05/30/2010 - 07/06/2010 Radiation Therapy   Adjuvant radiation by Dr. Lisbeth Renshaw   07/08/2010 - 12/29/2015 Anti-estrogen oral therapy   Tamoxifen 20 mg daily     CURRENT THERAPY: observation  INTERVAL HISTORY: Joan Weaver 81 y.o. female returns for follow-up of her history of breast cancer her most recent mammogram was completed on Mar 02, 2022 and showed no evidence of malignancy and breast density category A.  She also underwent bone density testing on Mar 02, 2022 which showed osteoporosis with a T score of -3.2 in the right femur and -2.8 in the left femur.  She was started on Fosamax weekly by her primary care provider.  She hasn't began taking this since she has some outstanding dental work that needs to be completed.    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- Dr  Elsworth Soho   Thyroid disease    Ulcer    Urinary incontinence     SURGICAL HISTORY: Past Surgical History:  Procedure Laterality Date   APPENDECTOMY     BREAST LUMPECTOMY     left   EYE SURGERY     left- cataract    HIATAL HERNIA REPAIR     x 2   HYSTEROSCOPY WITH D & C N/A 07/07/2013   Procedure: DILATATION AND CURETTAGE /HYSTEROSCOPY;  Surgeon: Anastasio Auerbach, MD;  Location: [facility];  Service: Gynecology;  Laterality: N/A;   KNEE SURGERY     Arthroscopic   left cataract surgery     TONSILLECTOMY      SOCIAL HISTORY: Social History   Socioeconomic History   Marital status: Widowed    Spouse name: Not on file   Number of children: 0   Years of education: Not on file   Highest education level: Not on file  Occupational History   Occupation: Retired     Comment: [employer]  Tobacco  Use   Smoking status: Never   Smokeless tobacco: Never  Vaping Use   Vaping Use: Never used  Substance and Sexual Activity   Alcohol use: Never   Drug use: Never   Sexual activity: Not Currently    Birth control/protection: Post-menopausal  Other Topics Concern   Not on file  Social History Narrative   Not on file   Social Determinants of Health   Financial Resource Strain: Not on file  Food Insecurity: Not on file  Transportation Needs: Not on file  Physical Activity: Inactive (03/31/2021)   Exercise Vital Sign    Days of Exercise per Week: 0 days    Minutes of Exercise per Session: 0 min  Stress: Not on file  Social Connections: Not on file  Intimate Partner Violence: Not At Risk (03/31/2021)    Humiliation, Afraid, Rape, and Kick questionnaire    Fear of Current or Ex-Partner: No    Emotionally Abused: No    Physically Abused: No    Sexually Abused: No    FAMILY HISTORY: Family History  Problem Relation Age of Onset   Rheum arthritis Mother    Heart disease Maternal Grandmother    Prostate cancer Maternal Grandfather    Cancer Paternal Grandfather        Stomach cancer   Breast cancer Maternal Aunt 70   Pancreatic cancer Paternal Aunt    Cancer Paternal Aunt     Review of Systems  Constitutional:  Negative for appetite change, chills, fatigue, fever and unexpected weight change.  HENT:   Negative for hearing loss, lump/mass and trouble swallowing.   Eyes:  Negative for eye problems and icterus.  Respiratory:  Negative for chest tightness, cough and shortness of breath.   Cardiovascular:  Negative for chest pain, leg swelling and palpitations.  Gastrointestinal:  Negative for abdominal distention, abdominal pain, constipation, diarrhea, nausea and vomiting.  Endocrine: Negative for hot flashes.  Genitourinary:  Negative for difficulty urinating.   Musculoskeletal:  Negative for arthralgias.  Skin:  Negative for itching and rash.  Neurological:  Negative for dizziness, extremity weakness, headaches and numbness.  Hematological:  Negative for adenopathy. Does not bruise/bleed easily.  Psychiatric/Behavioral:  Negative for depression. The patient is not nervous/anxious.       PHYSICAL EXAMINATION  ECOG PERFORMANCE STATUS: 0 - Asymptomatic  Vitals:   04/14/22 1424  BP: (!) 144/65  Pulse: 70  Temp: (!) 97.3 F (36.3 C)  SpO2: 96%    Physical Exam Constitutional:      General: She is not in acute distress.    Appearance: Normal appearance. She is not toxic-appearing.  HENT:     Head: Normocephalic and atraumatic.  Eyes:     General: No scleral icterus. Cardiovascular:     Rate and Rhythm: Normal rate and regular rhythm.     Pulses: Normal pulses.      Heart sounds: Normal heart sounds.  Pulmonary:     Effort: Pulmonary effort is normal.     Breath sounds: Normal breath sounds.  Chest:     Comments: Left breast s/p lumpectomy & radiation, no sign of local recurrence.   Abdominal:     General: Abdomen is flat. Bowel sounds are normal. There is no distension.     Palpations: Abdomen is soft.     Tenderness: There is no abdominal tenderness.  Musculoskeletal:        General: No swelling.     Cervical back: Neck supple.  Lymphadenopathy:  Cervical: No cervical adenopathy.  Skin:    General: Skin is warm and dry.     Findings: No rash.  Neurological:     General: No focal deficit present.     Mental Status: She is alert.  Psychiatric:        Mood and Affect: Mood normal.        Behavior: Behavior normal.     LABORATORY DATA:  CBC    Component Value Date/Time   WBC 6.1 07/19/2021 1501   RBC 4.86 07/19/2021 1501   HGB 14.2 07/19/2021 1501   HGB 13.4 08/20/2014 1339   HCT 42.9 07/19/2021 1501   HCT 40.7 08/20/2014 1339   PLT 264 07/19/2021 1501   PLT 195 08/20/2014 1339   MCV 88.3 07/19/2021 1501   MCV 88.7 08/20/2014 1339   MCH 29.2 07/19/2021 1501   MCHC 33.1 07/19/2021 1501   RDW 12.8 07/19/2021 1501   RDW 14.0 08/20/2014 1339   LYMPHSABS 1,848 07/19/2021 1501   LYMPHSABS 1.9 08/20/2014 1339   MONOABS 708 04/13/2016 1601   MONOABS 0.5 08/20/2014 1339   EOSABS 98 07/19/2021 1501   EOSABS 0.1 08/20/2014 1339   BASOSABS 73 07/19/2021 1501   BASOSABS 0.0 08/20/2014 1339    CMP     Component Value Date/Time   NA 141 07/19/2021 1501   NA 140 08/20/2014 1340   K 4.4 07/19/2021 1501   K 3.6 08/20/2014 1340   CL 106 07/19/2021 1501   CL 108 (H) 07/30/2012 1318   CO2 25 07/19/2021 1501   CO2 30 (H) 08/20/2014 1340   GLUCOSE 99 07/19/2021 1501   GLUCOSE 95 08/20/2014 1340   GLUCOSE 89 07/30/2012 1318   BUN 17 07/19/2021 1501   BUN 25.8 08/20/2014 1340   CREATININE 0.98 (H) 07/19/2021 1501   CREATININE 1.1  08/20/2014 1340   CALCIUM 9.6 07/19/2021 1501   CALCIUM 9.4 08/20/2014 1340   PROT 6.7 07/19/2021 1501   PROT 6.7 08/20/2014 1340   ALBUMIN 4.1 04/13/2016 1601   ALBUMIN 3.4 (L) 08/20/2014 1340   AST 20 07/19/2021 1501   AST 21 08/20/2014 1340   ALT 15 07/19/2021 1501   ALT 16 08/20/2014 1340   ALKPHOS 78 04/13/2016 1601   ALKPHOS 55 08/20/2014 1340   BILITOT 0.4 07/19/2021 1501   BILITOT 0.37 08/20/2014 1340   GFRNONAA 51 (L) 07/30/2020 1048   GFRAA 59 (L) 07/30/2020 1048       ASSESSMENT and THERAPY PLAN:   Breast cancer, left Nikkol is an 81 year old woman with h/o stage IA left sided estrogen positive breast cancer s/p lumpectomy, adjuvant radiation, & Tamoxifen x 5 years completed in 12/2015.    Haileigh is doing well today.  She has no clinical or radiographic signs of breast cancer recurrence.  I recommended that she continue with annual mammograms next due in 02/2023.    She was recommended to f/u with her PCP about her bone density testing and when to start fosamax.  Wellness promotion/health maintenance: I recommended that she continue with f/u with her PCP for her preventative health along with healthy diet/exercise.     All questions were answered. The patient knows to call the clinic with any problems, questions or concerns. We can certainly see the patient much sooner if necessary.  Total encounter time:20 minutes*in face-to-face visit time, chart review, lab review, care coordination, order entry, and documentation of the encounter time.  Wilber Bihari, NP 04/22/22 9:57 AM Medical Oncology and Hematology  [facility] [place], [place] [address] Tel. [phone]    Fax. [phone]  *Total Encounter Time as defined by the Centers for Medicare and Medicaid Services includes, in addition to the face-to-face time of a patient visit (documented in the note above) non-face-to-face time: obtaining and reviewing outside history, ordering and  reviewing medications, tests or procedures, care coordination (communications with other health care professionals or caregivers) and documentation in the medical record.

## 2022-04-14 NOTE — Telephone Encounter (Signed)
Per Wilber Bihari, NP, I called Dr. Vena Rua office to get the pt a follow up appointment due to her hiatal hernia and increasing dysphagia.  The pt is scheduled for June 21, 2022 at 10:30 with Dr. Hilarie Fredrickson.  I left the pt a voicemail with this appointment information, and I told her to call our office back if she had any concerns.

## 2022-04-17 ENCOUNTER — Telehealth: Payer: Self-pay | Admitting: Adult Health

## 2022-04-17 NOTE — Telephone Encounter (Signed)
Scheduled appointment per 7/7 los. Patient is aware.

## 2022-04-22 NOTE — Assessment & Plan Note (Signed)
Joan Weaver is an 81 year old woman with h/o stage IA left sided estrogen positive breast cancer s/p lumpectomy, adjuvant radiation, & Tamoxifen x 5 years completed in 12/2015.    Joan Weaver is doing well today.  She has no clinical or radiographic signs of breast cancer recurrence.  I recommended that she continue with annual mammograms next due in 02/2023.    She was recommended to f/u with her PCP about her bone density testing and when to start fosamax.  Wellness promotion/health maintenance: I recommended that she continue with f/u with her PCP for her preventative health along with healthy diet/exercise.

## 2022-06-07 ENCOUNTER — Other Ambulatory Visit: Payer: Self-pay | Admitting: Family Medicine

## 2022-06-07 DIAGNOSIS — I10 Essential (primary) hypertension: Secondary | ICD-10-CM

## 2022-06-07 NOTE — Telephone Encounter (Signed)
Requested Prescriptions  Pending Prescriptions Disp Refills  . amLODipine (NORVASC) 10 MG tablet [Pharmacy Med Name: amLODIPine Besylate 10 MG Oral Tablet] 30 tablet 0    Sig: Take 1 tablet by mouth once daily     Cardiovascular: Calcium Channel Blockers 2 Failed - 06/07/2022 11:46 AM      Failed - Last BP in normal range    BP Readings from Last 1 Encounters:  04/14/22 (!) 144/65         Failed - Valid encounter within last 6 months    Recent Outpatient Visits          10 months ago Hypothyroidism, unspecified type   [place] [name], Cammie Mcgee, MD   11 months ago Encounter for [employer] annual wellness exam   [place] Pickard, Cammie Mcgee, MD   1 year ago Dry mouth, unspecified   [place] [name], Cammie Mcgee, MD   3 years ago Benign essential HTN   [place] [name], MD   4 years ago Benign essential HTN   [place], Cammie Mcgee, MD             Passed - Last Heart Rate in normal range    Pulse Readings from Last 1 Encounters:  04/14/22 70         Courtesy refill.

## 2022-06-16 ENCOUNTER — Encounter: Payer: Self-pay | Admitting: *Deleted

## 2022-06-21 ENCOUNTER — Ambulatory Visit: Payer: Medicare Other | Admitting: Internal Medicine

## 2022-06-21 ENCOUNTER — Encounter: Payer: Self-pay | Admitting: Internal Medicine

## 2022-06-21 ENCOUNTER — Other Ambulatory Visit: Payer: Medicare Other

## 2022-06-21 VITALS — BP 142/80 | HR 80 | Ht 60.5 in | Wt 181.2 lb

## 2022-06-21 DIAGNOSIS — R1310 Dysphagia, unspecified: Secondary | ICD-10-CM

## 2022-06-21 DIAGNOSIS — R1319 Other dysphagia: Secondary | ICD-10-CM

## 2022-06-21 DIAGNOSIS — K219 Gastro-esophageal reflux disease without esophagitis: Secondary | ICD-10-CM

## 2022-06-21 DIAGNOSIS — K224 Dyskinesia of esophagus: Secondary | ICD-10-CM | POA: Diagnosis not present

## 2022-06-21 DIAGNOSIS — R197 Diarrhea, unspecified: Secondary | ICD-10-CM

## 2022-06-21 DIAGNOSIS — K529 Noninfective gastroenteritis and colitis, unspecified: Secondary | ICD-10-CM

## 2022-06-21 MED ORDER — NA SULFATE-K SULFATE-MG SULF 17.5-3.13-1.6 GM/177ML PO SOLN: 1.0000 | Freq: Once | ORAL | 0 refills | Status: AC

## 2022-06-21 NOTE — Patient Instructions (Signed)
_______________________________________________________  If you are age 82 or older, your body mass index should be between 23-30. Your Body mass index is 34.81 kg/m. If this is out of the aforementioned range listed, please consider follow up with your Primary Care Provider.  If you are age 59 or younger, your body mass index should be between 19-25. Your Body mass index is 34.81 kg/m. If this is out of the aformentioned range listed, please consider follow up with your Primary Care Provider.   ________________________________________________________  The [facility] providers would like to encourage you to use [facility] to communicate with providers for non-urgent requests or questions.  Due to long hold times on the telephone, sending your provider a message by [facility] may be a faster and more efficient way to get a response.  Please allow 48 business hours for a response.  Please remember that this is for non-urgent requests.  _______________________________________________________  Your provider has requested that you go to the basement level for lab work before leaving today. Press "B" on the elevator. The lab is located at the first door on the left as you exit the elevator.  You have been scheduled for an endoscopy and colonoscopy. Please follow the written instructions given to you at your visit today. Please pick up your prep supplies at the pharmacy within the next 1-3 days. If you use inhalers (even only as needed), please bring them with you on the day of your procedure.

## 2022-06-22 LAB — IGA: Immunoglobulin A: 193 mg/dL (ref 70–320)

## 2022-06-22 LAB — TISSUE TRANSGLUTAMINASE, IGA: (tTG) Ab, IgA: <1 U/mL

## 2022-06-22 NOTE — Progress Notes (Signed)
Patient ID: Joan Weaver, female   DOB: 10-05-1941, 81 y.o.   MRN: [id number] HPI: Joan Weaver is an 81 year old female with PMH of GERD, esophageal dysmotility with poor esophageal clearance, HTN, remote breast cancer, hypothyroidism who is here to reestablish care and to discuss GERD with dysphagia and chronic diarrhea.  She is here alone today and was last seen in 2014.  In 2014 manometry evaluation at [facility] with Dr. Bayard Hugger revealed normal LES pressures with normal relaxation of the LES but frequent failed peristalsis in the esophageal body.  She does recall having responded to prior dilation from a dysphagia perspective.  She reports that she is having issues with solid food dysphagia with foods such as bread and rice.  She avoids meats.  She takes small bites and tries to chew her food very well.  Again previous dilations seem to help.  She has some heartburn though pantoprazole 40 mg daily definitively helps.  She does occasionally use Pepcid or Gaviscon for breakthrough.  She is not having odynophagia.  She is not having upper abdominal pain.  She is struggling with diarrhea on a near daily basis.  She is having to wear an adult diaper to avoid soiling her underwear.  She has had accidents and very urgent stools.  She can be afraid to leave her home at times.  Stools vary from liquid to very soft.  Usually occur 3-5 times most typically in the morning time and much less throughout the day.  Very seldom does she have nocturnal bowel movement.  Occasionally prior to a bowel movement she has severe lower abdominal crampy pain which triggers nausea and vomiting.  She takes Imodium on a regular basis and primary care tried colestipol 1 g twice daily which she is taking.  Initially she felt like this was helpful but it has lost efficacy.  She has had recurrent UTIs and [facility]  She takes chronic trimethoprim.   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- Dr  Elsworth Soho   Thyroid disease    Ulcer    Urinary incontinence     Past Surgical History:  Procedure Laterality Date   APPENDECTOMY     BREAST LUMPECTOMY     left   EYE SURGERY     left- cataract    HIATAL HERNIA REPAIR     x 2   HYSTEROSCOPY WITH D & C N/A 07/07/2013   Procedure: DILATATION AND CURETTAGE /HYSTEROSCOPY;  Surgeon: Anastasio Auerbach, MD;  Location: [facility];  Service: Gynecology;  Laterality: N/A;   KNEE SURGERY     Arthroscopic   left cataract surgery     TONSILLECTOMY      Outpatient Medications Prior to Visit  Medication Sig Dispense Refill   acetaminophen (TYLENOL) 500 MG tablet Take 500 mg by mouth every 6 (six) hours as needed for  pain.      alendronate (FOSAMAX) 70 MG tablet Take 1 tablet (70 mg total) by mouth every 7 (seven) days. Take with a full glass of water on an empty stomach. 4 tablet 0   amLODipine (NORVASC) 10 MG tablet Take 1 tablet by mouth once daily 30 tablet 0   BIOTIN FORTE PO Take 1 tablet by mouth daily.     CALCIUM PO Take 2 tablets by mouth daily.     Cholecalciferol (VITAMIN D) 1000 UNITS capsule Take 1,000 Units by mouth daily.      colestipol (COLESTID) 1 g tablet Take 1 tablet (1 g total) by mouth 2 (two) times daily. 180 tablet 3   cycloSPORINE (RESTASIS) 0.05 % ophthalmic emulsion 1 drop 2 (two) times daily.     dicyclomine (BENTYL) 10 MG capsule Take 1 capsule (10 mg total) by mouth every 6 (six) hours as needed for spasms. 20 capsule 0   fish oil-omega-3 fatty acids 1000 MG capsule Take 2 g by mouth daily.       glucosamine-chondroitin 500-400 MG tablet Take 2 tablets by mouth daily.      levothyroxine (SYNTHROID) 25 MCG tablet Take 1 tablet (25 mcg total) by mouth daily. 90 tablet 3   loratadine (CLARITIN) 10 MG tablet Take 10 mg by mouth daily.       meclizine (ANTIVERT) 25 MG tablet Take 1 tablet (25 mg total) by mouth 3 (three) times daily as needed for dizziness. 30 tablet 1   metoprolol succinate (TOPROL-XL) 25 MG 24 hr tablet Take 1 tablet (25 mg total) by mouth daily. 90 tablet 3   Multiple Vitamin (MULTIVITAMIN) capsule Take 1 capsule by mouth daily.       pantoprazole (PROTONIX) 40 MG tablet Take 1 tablet by mouth once daily 90 tablet 1   Polyvinyl Alcohol-Povidone (CLEAR EYES ALL SEASONS OP) Apply 1 drop to eye daily as needed (dry eyes).     Probiotic Product (ALIGN PO) Take by mouth.     pyridOXINE (VITAMIN B-6) 100 MG tablet Take 100 mg by mouth daily.       raloxifene (EVISTA) 60 MG tablet Take 1 tablet (60 mg total) by mouth daily. 90 tablet 3   trimethoprim (TRIMPEX) 100 MG tablet Take 100 mg by mouth daily.     No facility-administered medications prior to visit.    Allergies  Allergen Reactions   Advair Diskus [Fluticasone-Salmeterol]     Nervous and shakey   Aspirin     If she takes too much, it burns her stomach   Bactrim [Sulfamethoxazole-Trimethoprim] Nausea Only   Demerol Nausea Only   Meperidine Nausea Only    Sick on Stomach   Qvar [Beclomethasone] Other (See Comments)    Headache    Family History  Problem Relation Age of Onset   Rheum arthritis Mother    Heart disease Maternal Grandmother    Prostate cancer Maternal Grandfather    Cancer Paternal Grandfather        Stomach cancer   Breast cancer Maternal Aunt 70   Pancreatic cancer Paternal Aunt    Cancer Paternal Aunt     Social History   Tobacco Use   Smoking status: Never   Smokeless tobacco: Never  Vaping Use   Vaping Use: Never used  Substance Use Topics   Alcohol use: Never   Drug use:  Never    ROS: As per history of present illness, otherwise negative  BP (!) 142/80   Pulse 80  Ht 5' 0.5" (1.537 m)   Wt 181 lb 3.2 oz (82.2 kg)   BMI 34.81 kg/m  Gen: awake, alert, NAD HEENT: anicteric CV: RRR, no mrg Pulm: CTA b/l Abd: soft, NT/ND, +BS throughout Ext: no c/c/e Neuro: nonfocal  RELEVANT LABS AND IMAGING: CBC    Component Value Date/Time   WBC 6.1 07/19/2021 1501   RBC 4.86 07/19/2021 1501   HGB 14.2 07/19/2021 1501   HGB 13.4 08/20/2014 1339   HCT 42.9 07/19/2021 1501   HCT 40.7 08/20/2014 1339   PLT 264 07/19/2021 1501   PLT 195 08/20/2014 1339   MCV 88.3 07/19/2021 1501   MCV 88.7 08/20/2014 1339   MCH 29.2 07/19/2021 1501   MCHC 33.1 07/19/2021 1501   RDW 12.8 07/19/2021 1501   RDW 14.0 08/20/2014 1339   LYMPHSABS 1,848 07/19/2021 1501   LYMPHSABS 1.9 08/20/2014 1339   MONOABS 708 04/13/2016 1601   MONOABS 0.5 08/20/2014 1339   EOSABS 98 07/19/2021 1501   EOSABS 0.1 08/20/2014 1339   BASOSABS 73 07/19/2021 1501   BASOSABS 0.0 08/20/2014 1339    CMP     Component Value Date/Time   NA 141 07/19/2021 1501   NA 140 08/20/2014 1340   K 4.4 07/19/2021 1501   K 3.6 08/20/2014 1340   CL 106 07/19/2021 1501   CL 108 (H) 07/30/2012 1318   CO2 25 07/19/2021 1501   CO2 30 (H) 08/20/2014 1340   GLUCOSE 99 07/19/2021 1501   GLUCOSE 95 08/20/2014 1340   GLUCOSE 89 07/30/2012 1318   BUN 17 07/19/2021 1501   BUN 25.8 08/20/2014 1340   CREATININE 0.98 (H) 07/19/2021 1501   CREATININE 1.1 08/20/2014 1340   CALCIUM 9.6 07/19/2021 1501   CALCIUM 9.4 08/20/2014 1340   PROT 6.7 07/19/2021 1501   PROT 6.7 08/20/2014 1340   ALBUMIN 4.1 04/13/2016 1601   ALBUMIN 3.4 (L) 08/20/2014 1340   AST 20 07/19/2021 1501   AST 21 08/20/2014 1340   ALT 15 07/19/2021 1501   ALT 16 08/20/2014 1340   ALKPHOS 78 04/13/2016 1601   ALKPHOS 55 08/20/2014 1340   BILITOT 0.4 07/19/2021 1501   BILITOT 0.37 08/20/2014 1340   GFRNONAA 51 (L) 07/30/2020 1048    GFRAA 59 (L) 07/30/2020 1048    ASSESSMENT/PLAN: 81 year old female with PMH of GERD, esophageal dysmotility with poor esophageal clearance, HTN, remote breast cancer, hypothyroidism who is here to reestablish care and to discuss GERD with dysphagia and chronic diarrhea.   GERD with esophageal dysmotility and dysphagia --heartburn is controlled with pantoprazole 40 mg daily.  She does recall favorable response to prior dilation.  We discussed the risk, benefits and alternatives to repeat upper endoscopy with dilation and she is agreeable and wishes to proceed -- [facility] in the [facility] with probable dilation -- Continue pantoprazole 40 mg daily.  2.  Chronic diarrhea --multiple loose to liquid bowel movements with accidents and urgency on a daily basis.  She is taking loperamide and colestipol 2 g daily and is still having loose stools and diarrhea throughout most mornings. -- GI pathogen panel, fecal calprotectin and fecal elastase -- Colonoscopy in the [place] assuming infectious work-up negative  We reviewed the risk, benefits and alternatives to upper and lower endoscopy and she is agreeable and wishes to proceed      NV:BTYOMAY, Cammie Mcgee, [place] [address] [place],  [place] [address]

## 2022-06-27 ENCOUNTER — Other Ambulatory Visit: Payer: Medicare Other

## 2022-06-27 DIAGNOSIS — K219 Gastro-esophageal reflux disease without esophagitis: Secondary | ICD-10-CM | POA: Diagnosis not present

## 2022-06-27 DIAGNOSIS — K529 Noninfective gastroenteritis and colitis, unspecified: Secondary | ICD-10-CM

## 2022-06-27 DIAGNOSIS — R1319 Other dysphagia: Secondary | ICD-10-CM | POA: Diagnosis not present

## 2022-06-30 LAB — GI PROFILE, STOOL, PCR

## 2022-06-30 LAB — CALPROTECTIN, FECAL: Calprotectin, Fecal: 86 ug/g (ref 0–120)

## 2022-07-03 LAB — PANCREATIC ELASTASE, FECAL: Pancreatic Elastase-1, Stool: 92 mcg/g — ABNORMAL LOW

## 2022-07-06 ENCOUNTER — Encounter: Payer: Self-pay | Admitting: Internal Medicine

## 2022-07-06 ENCOUNTER — Ambulatory Visit (AMBULATORY_SURGERY_CENTER): Payer: Medicare Other | Admitting: Internal Medicine

## 2022-07-06 ENCOUNTER — Telehealth: Payer: Self-pay | Admitting: Gastroenterology

## 2022-07-06 VITALS — BP 105/68 | HR 72 | Temp 97.1°F | Resp 16 | Ht 60.0 in | Wt 181.0 lb

## 2022-07-06 DIAGNOSIS — K219 Gastro-esophageal reflux disease without esophagitis: Secondary | ICD-10-CM | POA: Diagnosis not present

## 2022-07-06 DIAGNOSIS — K449 Diaphragmatic hernia without obstruction or gangrene: Secondary | ICD-10-CM | POA: Diagnosis not present

## 2022-07-06 DIAGNOSIS — R1310 Dysphagia, unspecified: Secondary | ICD-10-CM

## 2022-07-06 DIAGNOSIS — D123 Benign neoplasm of transverse colon: Secondary | ICD-10-CM

## 2022-07-06 DIAGNOSIS — R197 Diarrhea, unspecified: Secondary | ICD-10-CM | POA: Diagnosis not present

## 2022-07-06 DIAGNOSIS — K635 Polyp of colon: Secondary | ICD-10-CM

## 2022-07-06 DIAGNOSIS — D122 Benign neoplasm of ascending colon: Secondary | ICD-10-CM

## 2022-07-06 DIAGNOSIS — K529 Noninfective gastroenteritis and colitis, unspecified: Secondary | ICD-10-CM

## 2022-07-06 MED ORDER — SODIUM CHLORIDE 0.9 % IV SOLN: 500.0000 mL | Freq: Once | INTRAVENOUS | Status: DC

## 2022-07-06 NOTE — Op Note (Signed)
[facility] Patient Name: Joan Weaver Procedure Date: 07/06/2022 1:56 PM MRN: [id number] Endoscopist: Jerene Bears , MD Age: 81 Referring MD:  Date of Birth: 06-30-1941 Gender: Female Account #: [id number] Procedure:                Colonoscopy Indications:              Chronic diarrhea, borderline elevated fecal                            calprotectin and low fecal elastase, infectious                            panel negative Medicines:                Monitored Anesthesia Care Procedure:                Pre-Anesthesia Assessment:                           - Prior to the procedure, a History and Physical                            was performed, and patient medications and                            allergies were reviewed. The patient's tolerance of                            previous anesthesia was also reviewed. The risks                            and benefits of the procedure and the sedation                            options and risks were discussed with the patient.                            All questions were answered, and informed consent                            was obtained. Prior Anticoagulants: The patient has                            taken no previous anticoagulant or antiplatelet                            agents. ASA Grade Assessment: II - A patient with                            mild systemic disease. After reviewing the risks                            and benefits, the patient was deemed in  satisfactory condition to undergo the procedure.                           After obtaining informed consent, the colonoscope                            was passed under direct vision. Throughout the                            procedure, the patient's blood pressure, pulse, and                            oxygen saturations were monitored continuously. The                            PCF-HQ190L Colonoscope was introduced through the                             anus and advanced to the cecum, identified by                            appendiceal orifice and ileocecal valve. The                            colonoscopy was performed without difficulty. The                            patient tolerated the procedure well. The quality                            of the bowel preparation was good. The ileocecal                            valve, appendiceal orifice, and rectum were                            photographed. Scope In: 2:38:38 PM Scope Out: 2:55:53 PM Scope Withdrawal Time: 0 hours 13 minutes 34 seconds  Total Procedure Duration: 0 hours 17 minutes 15 seconds  Findings:                 The digital rectal exam was normal.                           A 7 mm polyp was found in the ascending colon. The                            polyp was sessile. The polyp was removed with a                            cold snare. Resection and retrieval were complete.                           A 5 mm polyp was found in the transverse colon. The  polyp was sessile. The polyp was removed with a                            cold snare. Resection and retrieval were complete.                           Multiple small and large-mouthed diverticula were                            found in the sigmoid colon.                           The exam was otherwise without abnormality on                            direct and retroflexion views.                           Biopsies for histology were taken with a cold                            forceps from the right colon and left colon for                            evaluation of microscopic colitis. Complications:            No immediate complications. Estimated Blood Loss:     Estimated blood loss was minimal. Impression:               - One 7 mm polyp in the ascending colon, removed                            with a cold snare. Resected and retrieved.                           - One 5  mm polyp in the transverse colon, removed                            with a cold snare. Resected and retrieved.                           - Moderate diverticulosis in the sigmoid colon.                           - The examination was otherwise normal on direct                            and retroflexion views.                           - Biopsies were taken with a cold forceps from the                            right colon and left colon for evaluation of  microscopic colitis. Recommendation:           - Patient has a contact number available for                            emergencies. The signs and symptoms of potential                            delayed complications were discussed with the                            patient. Return to normal activities tomorrow.                            Written discharge instructions were provided to the                            patient.                           - Resume previous diet.                           - Continue present medications.                           - Await pathology results. This will determine how                            to treat chronic diarrhea (budesonide versus                            pancreatic enzyme replacement).                           - No recommendation at this time regarding repeat                            colonoscopy due to age. Jerene Bears, MD 07/06/2022 3:09:10 PM This report has been signed electronically.

## 2022-07-06 NOTE — Progress Notes (Signed)
Called to room to assist during endoscopic procedure.  Patient ID and intended procedure confirmed with present staff. Received instructions for my participation in the procedure from the performing physician.  

## 2022-07-06 NOTE — Op Note (Signed)
[facility] Patient Name: Joan Weaver Procedure Date: 07/06/2022 2:36 PM MRN: [id number] Endoscopist: Jerene Bears , MD Age: 81 Referring MD:  Date of Birth: Sep 10, 1941 Gender: Female Account #: [id number] Procedure:                Upper GI endoscopy Indications:              Dysphagia, Suspected gastro-esophageal reflux                            disease, history of esophageal dysmotility Medicines:                Monitored Anesthesia Care Procedure:                Pre-Anesthesia Assessment:                           - Prior to the procedure, a History and Physical                            was performed, and patient medications and                            allergies were reviewed. The patient's tolerance of                            previous anesthesia was also reviewed. The risks                            and benefits of the procedure and the sedation                            options and risks were discussed with the patient.                            All questions were answered, and informed consent                            was obtained. Prior Anticoagulants: The patient has                            taken no previous anticoagulant or antiplatelet                            agents. ASA Grade Assessment: III - A patient with                            severe systemic disease. After reviewing the risks                            and benefits, the patient was deemed in                            satisfactory condition to undergo the procedure.  After obtaining informed consent, the endoscope was                            passed under direct vision. Throughout the                            procedure, the patient's blood pressure, pulse, and                            oxygen saturations were monitored continuously. The                            Endoscope was introduced through the mouth, and                            advanced to the  second part of duodenum. The upper                            GI endoscopy was accomplished without difficulty.                            The patient tolerated the procedure well. Scope In: Scope Out: Findings:                 Fluid was found in the middle third of the                            esophagus and in the lower third of the esophagus.                            This was removed prompted via scope suction channel.                           The lumen of the middle third of the esophagus and                            lower third of the esophagus was moderately dilated.                           The distal esophagus was moderately tortuous                            leading into the hiatal hernia. There is a sharp                            angulation created at GE junction due to hiatal                            hernia. No definite stricture seen.                           A 5 cm hiatal hernia was present.  The gastroesophageal flap valve was visualized                            endoscopically and classified as Hill Grade IV (no                            fold, wide open lumen, hiatal hernia present).                           The entire examined stomach was normal.                           The examined duodenum was normal. Complications:            No immediate complications. Estimated Blood Loss:     Estimated blood loss: none. Impression:               - Fluid in the middle third of the esophagus and in                            the lower third of the esophagus.                           - Dilation in the middle third of the esophagus and                            in the lower third of the esophagus.                           - Tortuous distal esophagus due in part to hiatal                            hernia.                           - 5 cm hiatal hernia.                           - Normal stomach.                           - Normal examined  duodenum.                           - No specimens collected. Recommendation:           - Patient has a contact number available for                            emergencies. The signs and symptoms of potential                            delayed complications were discussed with the                            patient. Return to normal activities tomorrow.  Written discharge instructions were provided to the                            patient.                           - Resume previous diet.                           - Continue present medications.                           - Perform esophagram with UGI series to evaluate                            hiatal hernia anatomy. Also a significant component                            of esophageal pathology is dysmotility (see prior                            manometry at [facility]).                           - Office follow-up with me in Dec 2023. Jerene Bears, MD 07/06/2022 3:06:12 PM This report has been signed electronically.

## 2022-07-06 NOTE — Telephone Encounter (Signed)
A friend of [name]) contacted the call service this evening because she had tried to talk to [name] over the phone, but [name] had such severe vomiting and retching that she was unable to talk.    [name] underwent an EGD and colonoscopy with Dr. Hilarie Fredrickson today to evaluate dysphagia and chronic diarrhea.  The EGD was notable for a hiatal hernia and a dilated proximal esophagus with fluid in the esophagus that was suctioned through the scope.  No dilation was performed.  The colonoscopy was notable for two subcentimeter polyps removed with cold snare and random colon biopsies were taken.  I then called [name] and she tells me that she has been having severe nausea and retching since about 6 pm (over 3 hours).  She also had severe chills and facial flushing.  She reports that she had some mild gas pain and a cough immediately after the procedure, but was feeling fine shortly thereafter.  When she tried to eat when she got home, she felt very nauseated after only a few bites, and then started vomiting.  She has not been able to keep any fluids down since then and she was lying in bed unable to move when I called.  If she moves, she gets severely nauseated.  She recalls being told that there was some concern some fluid may have gotten into her lungs during the procedure.  She denied symptoms of shortness of breath, cough or chest pain/tightness.  She has not had chills since she got in bed.  She did have a headache.  I suspect the patient's symptoms are most likely related to an aspiration event, although her symptoms are atypical (no significant respiratory complaints). I explained that aspiration events can be mild and require no specific therapy or can be severe and cause life threatening complications. As she was feeling a little better in the past hour, she was hopeful that she may be getting over it.  I told her I was very concerned that she could not keep fluids down and that  calling the EMS would be a reasonable course of action. She didn't want to do that just yet, and so we agreed to call her friend Joan Weaver to come over to her house and check on her.  If she is not able to keep fluids down, or if she is not able get out of bed, I told her to call the EMS and take her to the ED.  I then called Joan Weaver and gave her guidance to call EMS if Joan Weaver did not seem to be improving shortly.  I also told her that I would be available for further consultation this evening if needed.

## 2022-07-06 NOTE — Patient Instructions (Addendum)
Upper- Resume previous diet and medications. Perform Esophagram with UGI series to evaluate hiatal hernia anatomy. Also a significant component of esophageal pathology is dysmotility. (See prior manometry at [facility]). Office follow up with Dr. Hilarie Fredrickson in December 2023  [facility] to determine how to treat Chronic Diarrhea (Budesonide versus pancreatic enzyme replacement) No recommendation at this time regarding repeat Colonoscopy due to age.  YOU HAD AN ENDOSCOPIC PROCEDURE TODAY AT [facility]:   Refer to the procedure report that was given to you for any specific questions about what was found during the examination.  If the procedure report does not answer your questions, please call your gastroenterologist to clarify.  If you requested that your care partner not be given the details of your procedure findings, then the procedure report has been included in a sealed envelope for you to review at your convenience later.  YOU SHOULD EXPECT: Some feelings of bloating in the abdomen. Passage of more gas than usual.  Walking can help get rid of the air that was put into your GI tract during the procedure and reduce the bloating. If you had a lower endoscopy (such as a colonoscopy or flexible sigmoidoscopy) you may notice spotting of blood in your stool or on the toilet paper. If you underwent a bowel prep for your procedure, you may not have a normal bowel movement for a few days.  Please Note:  You might notice some irritation and congestion in your nose or some drainage.  This is from the oxygen used during your procedure.  There is no need for concern and it should clear up in a day or so.  SYMPTOMS TO REPORT IMMEDIATELY:  Following lower endoscopy (colonoscopy or flexible sigmoidoscopy):  Excessive amounts of blood in the stool  Significant tenderness or worsening of abdominal pains  Swelling of the abdomen that is new, acute  Fever of 100F or higher  Following upper  endoscopy (EGD)  Vomiting of blood or coffee ground material  New chest pain or pain under the shoulder blades  Painful or persistently difficult swallowing  New shortness of breath  Fever of 100F or higher  Black, tarry-looking stools  For urgent or emergent issues, a gastroenterologist can be reached at any hour by calling [phone]. Do not use MyChart messaging for urgent concerns.    DIET:  We do recommend a small meal at first, but then you may proceed to your regular diet.  Drink plenty of fluids but you should avoid alcoholic beverages for 24 hours.  ACTIVITY:  You should plan to take it easy for the rest of today and you should NOT DRIVE or use heavy machinery until tomorrow (because of the sedation medicines used during the test).    FOLLOW UP: Our staff will call the number listed on your records the next business day following your procedure.  We will call around 7:15- 8:00 am to check on you and address any questions or concerns that you may have regarding the information given to you following your procedure. If we do not reach you, we will leave a message.     If any biopsies were taken you will be contacted by phone or by letter within the next 1-3 weeks.  Please call us at [phone] if you have not heard about the biopsies in 3 weeks.    SIGNATURES/CONFIDENTIALITY: You and/or your care partner have signed paperwork which will be entered into your electronic medical record.  These signatures attest  to the fact that that the information above on your After Visit Summary has been reviewed and is understood.  Full responsibility of the confidentiality of this discharge information lies with you and/or your care-partner.

## 2022-07-06 NOTE — Progress Notes (Signed)
Pt in recovery with monitors in place, VSS. Report given to receiving RN. Bite guard was placed with pt awake to ensure comfort. No dental or soft tissue damage noted. 

## 2022-07-06 NOTE — Progress Notes (Signed)
See office note dated 06/21/2022 for details including H&P  Patient presenting to evaluate for EGD to evaluate GERD and dysphagia after colonoscopy to evaluate chronic diarrhea  She remains appropriate for upper and lower endoscopy in the [facility] today

## 2022-07-07 ENCOUNTER — Telehealth: Payer: Self-pay

## 2022-07-07 ENCOUNTER — Other Ambulatory Visit: Payer: Self-pay

## 2022-07-07 DIAGNOSIS — R1310 Dysphagia, unspecified: Secondary | ICD-10-CM

## 2022-07-07 DIAGNOSIS — K219 Gastro-esophageal reflux disease without esophagitis: Secondary | ICD-10-CM

## 2022-07-07 NOTE — Telephone Encounter (Signed)
Please check on patient today Low suspicion of aspiration, though this is certainly possible She has very poor esophageal motility and a complex recurrent hiatal hernia If ongoing n/v, fever or chills she needs to be seen today by [place], PCP or ER (depending on availability) Low threshold for CXR, 2 view abd xray May need doc of the day for [place] given I am out of the office today

## 2022-07-07 NOTE — Telephone Encounter (Signed)
Order entered in epic for barium esophagram and upper gi series, rad scheduling to contact pt regarding appt. Pt scheduled for OV with Dr. Hilarie Fredrickson in December. Appt letter mailed to pt.

## 2022-07-07 NOTE — Telephone Encounter (Signed)
See note from [facility] where pt was called this am.

## 2022-07-07 NOTE — Telephone Encounter (Signed)
  Follow up Call-     07/06/2022    1:36 PM  Call back number  Post procedure Call Back phone  # [phone]  Permission to leave phone message Yes     Patient questions:  Do you have a fever, pain , or abdominal swelling? No. Pain Score  0 *  Have you tolerated food without any problems? No.  Have you been able to return to your normal activities? No.  Do you have any questions about your discharge instructions: Diet   No. Medications  No. Follow up visit  No.  Do you have questions or concerns about your Care? No.  Actions: * If pain score is 4 or above: No action needed, pain <4.  Patient states she was nauseated and vomited a small amount after eating yesterday. States she called the on call MD. States she feels better this morning. Instructed to call back if she doesn't continue to improve.

## 2022-07-12 ENCOUNTER — Other Ambulatory Visit: Payer: Self-pay

## 2022-07-12 MED ORDER — ZENPEP 40000-126000 UNITS PO CPEP: ORAL_CAPSULE | ORAL | 3 refills | Status: DC

## 2022-07-25 ENCOUNTER — Telehealth: Payer: Self-pay

## 2022-07-25 NOTE — Telephone Encounter (Signed)
Pt called in requesting a courtesy refill of these meds:  amLODipine (NORVASC) 10 MG tablet [[id number]]  pantoprazole (PROTONIX) 40   Pt states that she has some test scheduled with her gastroenterologist and would like to wait for these test to be completed before she schedules an ov with pcp.  LOV: 08/16/21  PHARMACY: [facility] ([place]),

## 2022-07-26 ENCOUNTER — Other Ambulatory Visit: Payer: Self-pay

## 2022-07-26 DIAGNOSIS — I10 Essential (primary) hypertension: Secondary | ICD-10-CM

## 2022-07-26 DIAGNOSIS — K2100 Gastro-esophageal reflux disease with esophagitis, without bleeding: Secondary | ICD-10-CM

## 2022-07-26 MED ORDER — PANTOPRAZOLE SODIUM 40 MG PO TBEC: 40.0000 mg | DELAYED_RELEASE_TABLET | Freq: Every day | ORAL | 1 refills | Status: DC

## 2022-07-26 MED ORDER — AMLODIPINE BESYLATE 10 MG PO TABS: 10.0000 mg | ORAL_TABLET | Freq: Every day | ORAL | 1 refills | Status: DC

## 2022-07-28 ENCOUNTER — Other Ambulatory Visit: Payer: Self-pay | Admitting: Internal Medicine

## 2022-07-28 ENCOUNTER — Ambulatory Visit (HOSPITAL_COMMUNITY)
Admission: RE | Admit: 2022-07-28 | Discharge: 2022-07-28 | Disposition: A | Discharge status: Home / Self Care | Payer: Medicare Other | Source: Ambulatory Visit | Attending: Internal Medicine | Admitting: Internal Medicine

## 2022-07-28 DIAGNOSIS — K224 Dyskinesia of esophagus: Secondary | ICD-10-CM | POA: Diagnosis not present

## 2022-07-28 DIAGNOSIS — R1310 Dysphagia, unspecified: Secondary | ICD-10-CM

## 2022-07-28 DIAGNOSIS — K219 Gastro-esophageal reflux disease without esophagitis: Secondary | ICD-10-CM | POA: Diagnosis not present

## 2022-08-08 ENCOUNTER — Telehealth: Payer: Self-pay | Admitting: Internal Medicine

## 2022-08-08 NOTE — Telephone Encounter (Signed)
Spoke with pt and she is aware of results and recommendations per Dr. Hilarie Fredrickson, see result note.

## 2022-08-08 NOTE — Telephone Encounter (Signed)
Inbound call from patient returning call. Please advise.  Thank you  

## 2022-09-11 ENCOUNTER — Ambulatory Visit (INDEPENDENT_AMBULATORY_CARE_PROVIDER_SITE_OTHER): Payer: Medicare Other | Admitting: Family Medicine

## 2022-09-11 ENCOUNTER — Other Ambulatory Visit: Payer: Self-pay | Admitting: Family Medicine

## 2022-09-11 VITALS — BP 118/76 | HR 80 | Ht 60.0 in | Wt 179.8 lb

## 2022-09-11 DIAGNOSIS — E039 Hypothyroidism, unspecified: Secondary | ICD-10-CM | POA: Diagnosis not present

## 2022-09-11 DIAGNOSIS — I10 Essential (primary) hypertension: Secondary | ICD-10-CM

## 2022-09-11 DIAGNOSIS — Z23 Encounter for immunization: Secondary | ICD-10-CM | POA: Diagnosis not present

## 2022-09-11 NOTE — Progress Notes (Signed)
Subjective:    Patient ID: Joan Weaver, female    DOB: January 25, 1941, 81 y.o.   MRN: [id number]  HPI Patient is here today for regular checkup.  She has a history of esophageal dysmotility due to a hiatal hernia.  She states that her gastroenterologist recently discussed possibly requiring surgery due to the hiatal hernia as she had refractory reflux.  She also has pancreatic insufficiency however she is no longer taking her Zenpep due to the cost.  It would cost him 3000 hours.  Stent is currently in [place] remain.  She has not tried any Imodium.  Her blood pressure today is well-controlled at 118/76.  She denies any chest pain shortness of breath or dyspnea on exertion.  She is currently not taking Fosamax but is instead taking raloxifene for osteoporosis.  She had a T-score that was less than -3.  She is taking calcium and vitamin D.  She is due for a flu shot today. 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- Dr  Elsworth Soho   Thyroid disease    Ulcer    Urinary incontinence    Past Surgical History:  Procedure Laterality Date   APPENDECTOMY     BREAST LUMPECTOMY     left   BRONCHOSCOPY     EYE SURGERY     left- cataract    HIATAL HERNIA REPAIR     x 2   HYSTEROSCOPY WITH D & C N/A 07/07/2013   Procedure: DILATATION AND CURETTAGE /HYSTEROSCOPY;  Surgeon: Anastasio Auerbach, MD;  Location: [facility];  Service: Gynecology;  Laterality: N/A;   KNEE SURGERY     Arthroscopic   left cataract surgery     TONSILLECTOMY     Current Outpatient  Medications on File Prior to Visit  Medication Sig Dispense Refill   acetaminophen (TYLENOL) 500 MG tablet Take 500 mg by mouth every 6 (six) hours as needed for pain.      alendronate (FOSAMAX) 70 MG tablet Take 1 tablet (70 mg total) by mouth every 7 (seven) days. Take with a full glass of water on an empty stomach. 4 tablet 0   amLODipine (NORVASC) 10 MG tablet Take 1 tablet (10 mg total) by mouth daily. 30 tablet 1   BIOTIN FORTE PO Take 1 tablet by mouth daily.     CALCIUM PO Take 2 tablets by mouth daily.     Cholecalciferol (VITAMIN D) 1000 UNITS capsule Take 1,000 Units by mouth daily.      colestipol (COLESTID) 1 g tablet Take 1 tablet (1 g total) by mouth 2 (two) times daily. 180 tablet 3   cycloSPORINE (RESTASIS) 0.05 % ophthalmic emulsion 1 drop 2 (two) times daily.     dicyclomine (BENTYL) 10 MG capsule Take 1 capsule (10 mg total) by mouth every 6 (six) hours as needed for spasms. 20 capsule 0   fish  oil-omega-3 fatty acids 1000 MG capsule Take 2 g by mouth daily.      glucosamine-chondroitin 500-400 MG tablet Take 2 tablets by mouth daily.      levothyroxine (SYNTHROID) 25 MCG tablet Take 1 tablet (25 mcg total) by mouth daily. 90 tablet 3   loratadine (CLARITIN) 10 MG tablet Take 10 mg by mouth daily.       meclizine (ANTIVERT) 25 MG tablet Take 1 tablet (25 mg total) by mouth 3 (three) times daily as needed for dizziness. 30 tablet 1   metoprolol succinate (TOPROL-XL) 25 MG 24 hr tablet Take 1 tablet (25 mg total) by mouth daily. 90 tablet 3   Multiple Vitamin (MULTIVITAMIN) capsule Take 1 capsule by mouth daily.       Pancrelipase, Lip-Prot-Amyl, (ZENPEP) 40000-126000 units CPEP Take 2 capsules with meals and 1 capsule with a snack 210 capsule 3   pantoprazole (PROTONIX) 40 MG tablet Take 1 tablet (40 mg total) by mouth daily. 90 tablet 1   Probiotic Product (ALIGN PO) Take by mouth.     pyridOXINE (VITAMIN B-6) 100 MG tablet Take 100 mg by mouth daily.       raloxifene  (EVISTA) 60 MG tablet Take 1 tablet (60 mg total) by mouth daily. 90 tablet 3   trimethoprim (TRIMPEX) 100 MG tablet Take 100 mg by mouth daily.     No current facility-administered medications on file prior to visit.      Review of Systems  All other systems reviewed and are negative.      Objective:   Physical Exam Vitals reviewed.  Constitutional:      General: She is not in acute distress.    Appearance: She is obese. She is not ill-appearing or toxic-appearing.  Cardiovascular:     Rate and Rhythm: Normal rate and regular rhythm.     Pulses: Normal pulses.     Heart sounds: Normal heart sounds. No murmur heard. Pulmonary:     Effort: Pulmonary effort is normal. No respiratory distress.     Breath sounds: Normal breath sounds. No stridor. No wheezing, rhonchi or rales.  Abdominal:     General: Abdomen is flat. Bowel sounds are normal. There is no distension.     Palpations: Abdomen is soft.     Tenderness: There is no abdominal tenderness. There is no guarding.  Musculoskeletal:     Right lower leg: No edema.     Left lower leg: No edema.  Neurological:     Mental Status: She is alert.           Assessment & Plan:  Benign essential HTN - Plan: CBC with Differential/Platelet, Lipid panel, TSH, COMPLETE METABOLIC PANEL WITH GFR  Hypothyroidism, unspecified type - Plan: TSH  Flu vaccine need - Plan: Flu Vaccine QUAD High Dose(Fluad) Physical exam today is significant only for dyspnea on exertion which is a chronic finding from her.  Given her history of hypothyroidism, I will check a TSH.  Her blood pressure today is well controlled .  Patient received her flu shot.  Check CBC CMP and a lipid panel.  Goal LDL cholesterol less than 100.  Recommended trying Zantac in addition to pantoprazole for acid reflux.  Recommended trying Imodium 1 tablet 1-2 times a day as needed to help mitigate the diarrhea.

## 2022-09-12 ENCOUNTER — Other Ambulatory Visit: Payer: Self-pay | Admitting: Family Medicine

## 2022-09-12 LAB — CBC WITH DIFFERENTIAL/PLATELET
Absolute Monocytes: 853 cells/uL (ref 200–950)
Basophils Absolute: 78 cells/uL (ref 0–200)
Basophils Relative: 0.8 %
Eosinophils Absolute: 108 cells/uL (ref 15–500)
Eosinophils Relative: 1.1 %
HCT: 41.7 % (ref 35.0–45.0)
Hemoglobin: 14.3 g/dL (ref 11.7–15.5)
Lymphs Abs: 3695 cells/uL (ref 850–3900)
MCH: 29.9 pg (ref 27.0–33.0)
MCHC: 34.3 g/dL (ref 32.0–36.0)
MCV: 87.1 fL (ref 80.0–100.0)
MPV: 10.9 fL (ref 7.5–12.5)
Monocytes Relative: 8.7 %
Neutro Abs: 5067 cells/uL (ref 1500–7800)
Neutrophils Relative %: 51.7 %
Platelets: 261 10*3/uL (ref 140–400)
RBC: 4.79 10*6/uL (ref 3.80–5.10)
RDW: 12.6 % (ref 11.0–15.0)
Total Lymphocyte: 37.7 %
WBC: 9.8 10*3/uL (ref 3.8–10.8)

## 2022-09-12 LAB — COMPLETE METABOLIC PANEL WITH GFR
AG Ratio: 1.4 (calc) (ref 1.0–2.5)
ALT: 17 U/L (ref 6–29)
AST: 24 U/L (ref 10–35)
Albumin: 4.2 g/dL (ref 3.6–5.1)
Alkaline phosphatase (APISO): 51 U/L (ref 37–153)
BUN/Creatinine Ratio: 20 (calc) (ref 6–22)
BUN: 25 mg/dL (ref 7–25)
CO2: 27 mmol/L (ref 20–32)
Calcium: 9.9 mg/dL (ref 8.6–10.4)
Chloride: 101 mmol/L (ref 98–110)
Creat: 1.26 mg/dL — ABNORMAL HIGH (ref 0.60–0.95)
Globulin: 3 g/dL (calc) (ref 1.9–3.7)
Glucose, Bld: 101 mg/dL — ABNORMAL HIGH (ref 65–99)
Potassium: 5.1 mmol/L (ref 3.5–5.3)
Sodium: 137 mmol/L (ref 135–146)
Total Bilirubin: 0.3 mg/dL (ref 0.2–1.2)
Total Protein: 7.2 g/dL (ref 6.1–8.1)
eGFR: 43 mL/min/{1.73_m2} — ABNORMAL LOW (ref >=60)

## 2022-09-12 LAB — LIPID PANEL
Cholesterol: 172 mg/dL (ref <200)
HDL: 69 mg/dL (ref >=50)
LDL Cholesterol (Calc): 78 mg/dL (calc)
Non-HDL Cholesterol (Calc): 103 mg/dL (calc) (ref <130)
Total CHOL/HDL Ratio: 2.5 (calc) (ref <5.0)
Triglycerides: 155 mg/dL — ABNORMAL HIGH (ref <150)

## 2022-09-12 LAB — TSH: TSH: 3.53 mIU/L (ref 0.40–4.50)

## 2022-09-15 ENCOUNTER — Encounter: Payer: Self-pay | Admitting: *Deleted

## 2022-09-19 ENCOUNTER — Ambulatory Visit: Payer: Medicare Other | Admitting: Internal Medicine

## 2022-09-19 ENCOUNTER — Encounter: Payer: Self-pay | Admitting: Internal Medicine

## 2022-09-19 VITALS — BP 130/80 | HR 88 | Ht 60.0 in | Wt 178.8 lb

## 2022-09-19 DIAGNOSIS — K529 Noninfective gastroenteritis and colitis, unspecified: Secondary | ICD-10-CM | POA: Diagnosis not present

## 2022-09-19 DIAGNOSIS — K2100 Gastro-esophageal reflux disease with esophagitis, without bleeding: Secondary | ICD-10-CM

## 2022-09-19 DIAGNOSIS — R1319 Other dysphagia: Secondary | ICD-10-CM | POA: Diagnosis not present

## 2022-09-19 DIAGNOSIS — R1310 Dysphagia, unspecified: Secondary | ICD-10-CM

## 2022-09-19 DIAGNOSIS — Z8719 Personal history of other diseases of the digestive system: Secondary | ICD-10-CM

## 2022-09-19 DIAGNOSIS — K449 Diaphragmatic hernia without obstruction or gangrene: Secondary | ICD-10-CM

## 2022-09-19 DIAGNOSIS — K219 Gastro-esophageal reflux disease without esophagitis: Secondary | ICD-10-CM

## 2022-09-19 MED ORDER — PANTOPRAZOLE SODIUM 40 MG PO TBEC: 40.0000 mg | DELAYED_RELEASE_TABLET | Freq: Two times a day (BID) | ORAL | 2 refills | Status: DC

## 2022-09-19 NOTE — Patient Instructions (Signed)
We have sent the following medications to your pharmacy for you to pick up at your convenience: Pantoprazole   Stop Zenpep.   Increase your Pantoprazole to 40 mg - twice daily.   Continue Colestid - 1 gram - twice daily.   You have been referred to [facility] - Dr. Richrd Prime. If you have not heard from their office in 1-2 weeks, please contact their office.  Address: [address] [place], [place], [place] [address]  Phone: [phone]  You may use Iodium 1/2 tablet twice daily to help with diarrhea.   Due to recent changes in healthcare laws, you may see the results of your imaging and laboratory studies on MyChart before your provider has had a chance to review them.  We understand that in some cases there may be results that are confusing or concerning to you. Not all laboratory results come back in the same time frame and the provider may be waiting for multiple results in order to interpret others.  Please give [place] 48 hours in order for your provider to thoroughly review all the results before contacting the office for clarification of your results.      Thank you for choosing me and [facility].  Dr. Ulice Dash Pyrtle

## 2022-09-20 ENCOUNTER — Encounter: Payer: Self-pay | Admitting: Internal Medicine

## 2022-09-20 NOTE — Progress Notes (Signed)
Subjective:    Patient ID: Joan Weaver, female    DOB: 1941/04/27, 81 y.o.   MRN: [id number]  HPI Joan Weaver is an 81 year old female with a past medical history of GERD, esophageal dysmotility with poor esophageal clearance, HTN, remote breast cancer, hypothyroidism, chronic diarrhea who is here for follow-up.  She is here today with her close friend.  She was last seen in late September for upper endoscopy and colonoscopy.  See EGD and colonoscopy for details: EGD -fluid in the esophagus, moderately dilated mid and distal esophagus.  Tortuous distal esophagus leading into recurrent hiatal hernia with sharp angulation.  5 cm hiatal hernia. Colonoscopy 2 subcentimeter polyps removed, sigmoid diverticulosis.  Random biopsies.  Pathology: SSP x 2, normal colonic mucosa without microscopic colitis changes.  Barium esophagram was performed after her upper endoscopy which is written below.  She reports that she continues to have issues with heartburn, pyrosis on a daily basis.  Typically in the evening and at night.  This is despite pantoprazole 40 mg daily.  Pantoprazole has been helpful though but seems to lose efficacy late in the day and evening.  Her food intake is limited by her dysphagia and poor esophageal clearance symptoms.  She reports she has to eat very slowly and take very small bites.  She is able to finish her meal.  She is not having vomiting or nausea recently.  No blood in stool or melena.  She tried Zenpep after the colonoscopy and did not find it terribly helpful and it was cost prohibitive.  She is continue colestipol 1 g twice daily.  Primary care added 1 mg of loperamide twice daily and her diarrhea is significantly better.  Stools are again formed and no further diarrhea.  She has skipped several doses of loperamide because of no bowel movement on a given today.  She has questions regarding repeat hiatal hernia surgery given the limitations of her swallowing and her severe  heartburn.  She has had hiatal hernia repair x 2 previously with the most recent redo in 2004 at [facility].  Review of Systems As per HPI, otherwise negative  Current Medications, Allergies, Past Medical History, Past Surgical History, Family History and Social History were reviewed in [employer] record.    Objective:   Physical Exam BP 130/80 (BP Location: Left Arm, Patient Position: Sitting, Cuff Size: Normal)   Pulse 88   Ht 5' (1.524 m)   Wt 178 lb 12.8 oz (81.1 kg)   SpO2 97%   BMI 34.92 kg/m  Gen: awake, alert, NAD HEENT: anicteric  Neuro: nonfocal   ESOPHAGUS/BARIUM SWALLOW/TABLET STUDY   TECHNIQUE: Single contrast examination was performed using thin liquid barium. The exam was limited due to patient's decreased motility. All imaging was obtained in a semiupright/supine position. This exam was performed by Durenda Guthrie, PA-C, and was supervised and interpreted by Logan Bores, MD.   FLUOROSCOPY: Radiation Exposure Index (as provided by the fluoroscopic device): 12.0 mGy Kerma   COMPARISON:  Chest CT 07/26/2018.  Barium swallow 03/12/2012.   FINDINGS: Swallowing: Appears normal. No vestibular penetration or aspiration seen.   Pharynx: Prominent cricopharyngeus, similar to the prior esophagram and without obstruction of barium.   Esophagus: Moderately patulous esophagus, mildly larger than on the prior esophagram. Tortuous distal esophagus leading to a moderate-sized hiatal hernia which is new from the prior esophagram. Severe narrowing at the GE junction at the site of the prior Nissen fundoplication resulting in severely  delayed passage of contrast from the esophagus into the stomach. Incomplete emptying of contrast from the esophagus after 30 minutes of observation despite the patient being in a seated/upright position for part of this time.   Esophageal motility: Severely diminished.   Hiatal Hernia:  Moderate-sized sliding hiatal hernia.   Gastroesophageal reflux: Not evaluated given substantial retained contrast in the esophagus throughout the examination.   Ingested 13 mm barium tablet: Not given.   Other: None.   IMPRESSION: 1. Patulous and distally tortuous esophagus leading to a moderate-sized hiatal hernia. 2. Severe narrowing at the GE junction at the site of prior Nissen fundoplication. Severely delayed passage of contrast into the stomach with incomplete clearing of the esophagus despite prolonged observation. 3. Severe esophageal dysmotility.     Electronically Signed   By: Logan Bores M.D.   On: 07/28/2022 11:42     Assessment & Plan:  81 year old female with a past medical history of GERD, esophageal dysmotility with poor esophageal clearance, HTN, remote breast cancer, hypothyroidism, chronic diarrhea who is here for follow-up  Recurrent hiatal hernia/GERD/esophageal dysmotility --significant and daily symptoms which are attributable in large part to her recurrent hiatal hernia after repair x 2 previously.  This is not expected to get better with medical management though we may be able to slightly improve pyrosis by increasing PPI.  She is interested in at least surgical consultation, though I have cautioned her that this may be a more technically complex surgery given her age and prior repairs.  Certainly reasonable for her to get surgical opinion and I would recommend this with Dr. Erie Noe. -- Increase pantoprazole to 40 mg twice daily AC -- Soft diet, chew food well and eat slowly with small bites -- Remain upright for 90 minutes after eating solid foods -- Referral to Dr. Erie Noe at [facility] for consideration of hiatal hernia repair  2.  Chronic diarrhea --no definitive diagnosis and certainly nothing concerning by colonoscopy.  Excellent response to the combination of colestipol and low-dose loperamide -- Continue colestipol 1 g twice daily -- Continue  loperamide 1 mg twice daily as needed -- Discontinue pancreatic enzymes as they were ineffective  4-monthfollow-up with me   40 minutes total spent today including patient facing time, coordination of care, reviewing medical history/procedures/pertinent radiology studies, and documentation of the encounter.

## 2022-09-26 ENCOUNTER — Other Ambulatory Visit: Payer: Self-pay | Admitting: Family Medicine

## 2022-09-26 DIAGNOSIS — I10 Essential (primary) hypertension: Secondary | ICD-10-CM

## 2022-09-28 ENCOUNTER — Telehealth: Payer: Self-pay | Admitting: Family Medicine

## 2022-09-28 NOTE — Telephone Encounter (Signed)
Patient called to follow up on refill request for raloxifene (EVISTA) 60 MG tablet [[id number]] ; stated she's out of medication and the pharmacy doesn't have our refill request yet.  Pharmacy confirmed as Educational [facility] at [facility] on [address].   Please advise at [phone].

## 2022-09-29 ENCOUNTER — Other Ambulatory Visit: Payer: Self-pay

## 2022-09-29 DIAGNOSIS — M810 Age-related osteoporosis without current pathological fracture: Secondary | ICD-10-CM

## 2022-09-29 MED ORDER — RALOXIFENE HCL 60 MG PO TABS: 60.0000 mg | ORAL_TABLET | Freq: Every day | ORAL | 3 refills | Status: DC

## 2022-09-29 NOTE — Telephone Encounter (Signed)
Refill request also received from pharmacy via fax.  Please fax to pharmacist at [phone].

## 2022-10-18 ENCOUNTER — Telehealth: Payer: Self-pay

## 2022-10-18 NOTE — Telephone Encounter (Signed)
PRIOR AUTH FOR DICYCLOMINE SENT TO PLAN.  Joan Weaver (Key: [id number]) Rx #: [id number]

## 2022-10-23 ENCOUNTER — Telehealth: Payer: Self-pay | Admitting: Family Medicine

## 2022-10-23 NOTE — Telephone Encounter (Signed)
No answer unable to leave a message for patient to call back and schedule Medicare Annual Wellness Visit (AWV) in office.   If not able to come in office, please offer to do virtually or by telephone.   Last AWV: 07/19/2021  Please schedule at any time with [facility]-Nurse Health Advisor.  30 minute appointment  Any questions, please contact me at [phone]   Thank you,   Stephanie  Ambulatory Clinical Support for [facility]  Care Management /[facility] You Are. We Are. [facility] ??[phone] or ??[phone] 

## 2022-11-01 ENCOUNTER — Telehealth: Payer: Self-pay

## 2022-11-01 NOTE — Telephone Encounter (Signed)
PA SENT TO PLAN THROUGH COVER MY MEDS: Joan Weaver (Key: [id number]) Need Help? Call us at [phone]  Drug Dicyclomine HCl '10MG'$  capsules  Form [employer] [place] Medicare Part D Electronic Request Form (CB)

## 2022-11-09 ENCOUNTER — Telehealth: Payer: Self-pay

## 2022-11-09 NOTE — Telephone Encounter (Signed)
Received fax from [facility] that pt's Dicyclomine 10 mg has been approved. Mjp,lpn

## 2022-11-09 NOTE — Telephone Encounter (Signed)
PA APPROVED FOR DICYCLOMINE. MJP,LPN

## 2022-11-21 ENCOUNTER — Telehealth: Payer: Self-pay | Admitting: Family Medicine

## 2022-11-21 NOTE — Telephone Encounter (Signed)
No answer unable to leave a message for patient to call back and schedule Medicare Annual Wellness Visit (AWV) in office.   If not able to come in office, please offer to do virtually or by telephone.   Last AWV: 07/19/2021  Please schedule at any time with [facility]-Nurse Health Advisor.  30 minute appointment  Any questions, please contact me at [phone]   Thank you,   [facility]  Ambulatory Clinical Support for Joan Weaver. We [facility]. [facility] ??[phone] or ??[phone]

## 2022-12-07 DIAGNOSIS — R350 Frequency of micturition: Secondary | ICD-10-CM | POA: Diagnosis not present

## 2022-12-07 DIAGNOSIS — N390 Urinary tract infection, site not specified: Secondary | ICD-10-CM | POA: Diagnosis not present

## 2022-12-08 ENCOUNTER — Other Ambulatory Visit: Payer: Self-pay

## 2022-12-08 ENCOUNTER — Telehealth: Payer: Self-pay | Admitting: Family Medicine

## 2022-12-08 DIAGNOSIS — I10 Essential (primary) hypertension: Secondary | ICD-10-CM

## 2022-12-08 MED ORDER — AMLODIPINE BESYLATE 10 MG PO TABS: ORAL_TABLET | ORAL | 1 refills | Status: DC

## 2022-12-08 MED ORDER — METOPROLOL SUCCINATE ER 25 MG PO TB24: 25.0000 mg | ORAL_TABLET | Freq: Every day | ORAL | 1 refills | Status: DC

## 2022-12-08 NOTE — Telephone Encounter (Signed)
Prescription Request  12/08/2022   LOV: 09/11/2022  What is the name of the medication or equipment?   metoprolol succinate (TOPROL-XL) 25 MG 24 hr tablet [id number]   amLODipine (NORVASC) 10 MG tablet [id number]  (last dose of amlodipine taken 09/26/22) - BP elevated. **EQUAL QUANTITIES REQUESTED SINCE SHE TAKES THEM TOGETHER.REQUESTING A ONE YEAR SUPPLY.**  Have you contacted your pharmacy to request a refill? Yes   Which pharmacy would you like this sent to?  [facility] ([place]), [place] - [address] [place]) [place] [address] Phone: [phone] Fax: [phone]    Patient notified that their request is being sent to the clinical staff for review and that they should receive a response within 2 business days.   Please advise patient when refills sent to pharmacy at [phone].

## 2022-12-27 ENCOUNTER — Other Ambulatory Visit: Payer: Self-pay | Admitting: Family Medicine

## 2023-01-15 ENCOUNTER — Telehealth: Payer: Self-pay | Admitting: Family Medicine

## 2023-01-15 NOTE — Telephone Encounter (Signed)
Called patient to schedule Medicare Annual Wellness Visit (AWV). No voicemail available to leave a message.  Last date of AWV: 07/19/2021   Please schedule an appointment at any time with Toni Amend, [facility] .  If any questions, please contact me at [phone].  Thank you,  Judeth Cornfield,  AMB Clinical Support [facility] AWV Program Direct Dial ??[phone]

## 2023-03-15 DIAGNOSIS — Z1231 Encounter for screening mammogram for malignant neoplasm of breast: Secondary | ICD-10-CM | POA: Diagnosis not present

## 2023-03-15 LAB — HM MAMMOGRAPHY

## 2023-03-16 ENCOUNTER — Encounter: Payer: Self-pay | Admitting: Adult Health

## 2023-03-29 ENCOUNTER — Other Ambulatory Visit: Payer: Self-pay | Admitting: Family Medicine

## 2023-04-17 ENCOUNTER — Inpatient Hospital Stay: Payer: Medicare Other | Admitting: Adult Health

## 2023-04-17 ENCOUNTER — Telehealth: Payer: Self-pay | Admitting: Adult Health

## 2023-04-17 NOTE — Telephone Encounter (Signed)
Patient is currently sick and unable to make it to scheduled appointment; patient is aware of rescheduled appointment times/dates

## 2023-05-03 ENCOUNTER — Encounter: Payer: Self-pay | Admitting: Adult Health

## 2023-05-03 ENCOUNTER — Other Ambulatory Visit: Payer: Self-pay

## 2023-05-03 ENCOUNTER — Inpatient Hospital Stay: Payer: Medicare Other | Attending: Adult Health | Admitting: Adult Health

## 2023-05-03 VITALS — BP 148/75 | HR 67 | Temp 98.0°F | Resp 18 | Ht 60.0 in | Wt 189.2 lb

## 2023-05-03 DIAGNOSIS — Z8042 Family history of malignant neoplasm of prostate: Secondary | ICD-10-CM | POA: Insufficient documentation

## 2023-05-03 DIAGNOSIS — C50512 Malignant neoplasm of lower-outer quadrant of left female breast: Secondary | ICD-10-CM

## 2023-05-03 DIAGNOSIS — Z800 Family history of malignant neoplasm of digestive organs: Secondary | ICD-10-CM | POA: Diagnosis not present

## 2023-05-03 DIAGNOSIS — Z853 Personal history of malignant neoplasm of breast: Secondary | ICD-10-CM | POA: Diagnosis not present

## 2023-05-03 DIAGNOSIS — Z803 Family history of malignant neoplasm of breast: Secondary | ICD-10-CM | POA: Insufficient documentation

## 2023-05-03 DIAGNOSIS — Z170 Estrogen receptor positive status [ER+]: Secondary | ICD-10-CM

## 2023-05-03 NOTE — Assessment & Plan Note (Signed)
Joan Weaver is an 82 year old woman with h/o stage IA left sided estrogen positive breast cancer s/p lumpectomy, adjuvant radiation, & Tamoxifen x 5 years completed in 12/2015.    [name] is doing well today.  She has no clinical or radiographic signs of breast cancer recurrence.  I recommended that she continue with annual mammograms next due in 03/2024.  Health maintenance: I recommended that she continue to follow-up with her primary care provider and that she remain active and get plenty of fruits and vegetables each day.   [name] will return in 1 year for continued long-term follow-up.  She knows that if any concerns arise we are happy to see her sooner if needed.

## 2023-05-03 NOTE — Progress Notes (Signed)
[facility] Cancer Follow up:    Donita Brooks, MD [address] [place] [address]   DIAGNOSIS: History of left breast cancer  SUMMARY OF ONCOLOGIC HISTORY: Oncology History  Breast cancer, left (HCC)  05/11/2010 Surgery   Left breast lumpectomy: Invasive ductal carcinoma 6 mm, grade 2, margins negative, 0/2 sentinel nodes, ER 98%, PR 97%, HER-2 negative, Ki-67 18%, T1b N0 M0 stage IA   05/30/2010 - 07/06/2010 Radiation Therapy   Adjuvant radiation by Dr. Mitzi Hansen   07/08/2010 - 12/29/2015 Anti-estrogen oral therapy   Tamoxifen 20 mg daily     CURRENT THERAPY: observation  INTERVAL HISTORY: Joan Weaver 82 y.o. female returns for follow up of her history of breast cancer.  Her most recent mammogram occurred on March 15, 2023 demonstrating no mammographic evidence of malignancy and breast density category A.   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- Dr  Vassie Loll   Thyroid disease    Ulcer    Urinary incontinence     SURGICAL HISTORY: Past Surgical History:  Procedure Laterality Date   APPENDECTOMY     BREAST LUMPECTOMY     left   BRONCHOSCOPY     EYE SURGERY     left- cataract    HIATAL HERNIA REPAIR     x 2   HYSTEROSCOPY WITH D & C N/A 07/07/2013   Procedure: DILATATION AND CURETTAGE /HYSTEROSCOPY;  Surgeon: Dara Lords, MD;  Location: [facility];  Service: Gynecology;  Laterality: N/A;   KNEE SURGERY     Arthroscopic   left cataract surgery     TONSILLECTOMY      SOCIAL HISTORY: Social History   Socioeconomic History   Marital status: Widowed    Spouse name: Not on file   Number of children: 0   Years of education: Not on file   Highest education level: Not on file  Occupational History   Occupation: Retired     Comment: [employer]  Tobacco Use   Smoking status: Never   Smokeless tobacco: Never  Vaping Use   Vaping status: Never Used  Substance and Sexual Activity   Alcohol use: Never   Drug use: Never   Sexual activity:  Not Currently    Birth control/protection: Post-menopausal  Other Topics Concern   Not on file  Social History Narrative   Not on file   Social Determinants of Health   Financial Resource Strain: Not on file  Food Insecurity: Not on file  Transportation Needs: Not on file  Physical Activity: Inactive (03/31/2021)   Exercise Vital Sign    Days of Exercise per Week: 0 days    Minutes of Exercise per Session: 0 min  Stress: Not on file  Social Connections: Not on file  Intimate Partner Violence: Not At Risk (03/31/2021)   Humiliation, Afraid, Rape, and Kick questionnaire    Fear of Current or Ex-Partner: No    Emotionally Abused: No    Physically Abused: No    Sexually Abused: No    FAMILY HISTORY: Family  History  Problem Relation Age of Onset   Rheum arthritis Mother    Breast cancer Maternal Aunt 21   Pancreatic cancer Paternal Aunt    Cancer Paternal Aunt    Stomach cancer Paternal Uncle    Heart disease Maternal Grandmother    Prostate cancer Maternal Grandfather    Cancer Paternal Grandfather        Stomach cancer   Colon cancer Neg Hx    Esophageal cancer Neg Hx     Review of Systems  Constitutional:  Negative for appetite change, chills, fatigue, fever and unexpected weight change.  HENT:   Negative for hearing loss, lump/mass and trouble swallowing.   Eyes:  Negative for eye problems and icterus.  Respiratory:  Negative for chest tightness, cough and shortness of breath.   Cardiovascular:  Negative for chest pain, leg swelling and palpitations.  Gastrointestinal:  Negative for abdominal distention, abdominal pain, constipation, diarrhea, nausea and vomiting.  Endocrine: Negative for hot flashes.  Genitourinary:  Negative for difficulty urinating.   Musculoskeletal:  Negative for arthralgias.  Skin:  Negative for itching and rash.  Neurological:  Negative for dizziness, extremity weakness, headaches and numbness.  Hematological:  Negative for adenopathy. Does not bruise/bleed easily.  Psychiatric/Behavioral:  Negative for depression. The patient is not nervous/anxious.       PHYSICAL EXAMINATION   Onc Performance Status - 05/03/23 1544       ECOG Perf Status   ECOG Perf Status Ambulatory and capable of all selfcare but unable to carry out any work activities.  Up and about more than 50% of waking hours      KPS SCALE   KPS % SCORE Normal activity with effort, some s/s of disease             Vitals:   05/03/23 1543  BP: (!) 148/75  Pulse: 67  Resp: 18  Temp: 98 F (36.7 C)  SpO2: 99%    Physical Exam Constitutional:      General: She is not in acute distress.    Appearance: Normal appearance. She is not toxic-appearing.  HENT:     Head:  Normocephalic and atraumatic.     Mouth/Throat:     Mouth: Mucous membranes are moist.     Pharynx: Oropharynx is clear. No oropharyngeal exudate or posterior oropharyngeal erythema.  Eyes:     General: No scleral icterus. Cardiovascular:     Rate and Rhythm: Normal rate and regular rhythm.     Pulses: Normal pulses.     Heart sounds: Normal heart sounds.  Pulmonary:     Effort: Pulmonary effort is normal.     Breath sounds: Normal  breath sounds.  Chest:     Comments: Left breast status postlumpectomy and radiation no sign of local recurrence right breast is benign. Abdominal:     General: Abdomen is flat. Bowel sounds are normal. There is no distension.     Palpations: Abdomen is soft.     Tenderness: There is no abdominal tenderness.  Musculoskeletal:        General: No swelling.     Cervical back: Neck supple.  Lymphadenopathy:     Cervical: No cervical adenopathy.  Skin:    General: Skin is warm and dry.     Findings: No rash.  Neurological:     General: No focal deficit present.     Mental Status: She is alert.  Psychiatric:        Mood and Affect: Mood normal.        Behavior: Behavior normal.       ASSESSMENT and THERAPY PLAN:   Breast cancer, left Priti is an 82 year old woman with h/o stage IA left sided estrogen positive breast cancer s/p lumpectomy, adjuvant radiation, & Tamoxifen x 5 years completed in 12/2015.    Ahlani is doing well today.  She has no clinical or radiographic signs of breast cancer recurrence.  I recommended that she continue with annual mammograms next due in 03/2024.  Health maintenance: I recommended that she continue to follow-up with her primary care provider and that she remain active and get plenty of fruits and vegetables each day.   Reyanne will return in 1 year for continued long-term follow-up.  She knows that if any concerns arise we are happy to see her sooner if needed.    All questions were answered. The patient knows to  call the clinic with any problems, questions or concerns. We can certainly see the patient much sooner if necessary.  Total encounter time:20 minutes*in face-to-face visit time, chart review, lab review, care coordination, order entry, and documentation of the encounter time.    Lillard Anes, NP 05/03/23 4:08 PM Medical Oncology and Hematology [facility] [address] [place], [place] [address] Tel. [phone]    Fax. [phone]  *Total Encounter Time as defined by the Centers for Medicare and Medicaid Services includes, in addition to the face-to-face time of a patient visit (documented in the note above) non-face-to-face time: obtaining and reviewing outside history, ordering and reviewing medications, tests or procedures, care coordination (communications with other health care professionals or caregivers) and documentation in the medical record.

## 2023-06-06 ENCOUNTER — Other Ambulatory Visit: Payer: Self-pay | Admitting: Family Medicine

## 2023-06-06 DIAGNOSIS — I10 Essential (primary) hypertension: Secondary | ICD-10-CM

## 2023-06-07 NOTE — Telephone Encounter (Signed)
Requested Prescriptions  Pending Prescriptions Disp Refills   colestipol (COLESTID) 1 g tablet [Pharmacy Med Name: Colestipol HCl 1 GM Oral Tablet] 180 tablet 0    Sig: Take 1 tablet by mouth twice daily     Cardiovascular:  Antilipid - Bile Acid Sequestrants Failed - 06/06/2023  4:05 PM      Failed - Valid encounter within last 12 months    Recent Outpatient Visits           1 year ago Hypothyroidism, unspecified type   [facility] Donita Brooks, MD   1 year ago Encounter for Medicare annual wellness exam   [facility] Medicine Donita Brooks, MD   2 years ago Dry mouth, unspecified   [facility] Medicine Tanya Nones, Priscille Heidelberg, MD   4 years ago Benign essential HTN   [facility] Medicine Tanya Nones, Priscille Heidelberg, MD   5 years ago Benign essential HTN   [facility] Medicine Donita Brooks, MD              Failed - Lipid Panel in normal range within the last 12 months    Cholesterol  Date Value Ref Range Status  09/11/2022 172 <200 mg/dL Final   LDL Cholesterol (Calc)  Date Value Ref Range Status  09/11/2022 78 mg/dL (calc) Final    Comment:    Reference range: <100 . Desirable range <100 mg/dL for primary prevention;   <70 mg/dL for patients with CHD or diabetic patients  with > or = 2 CHD risk factors. Marland Kitchen LDL-C is now calculated using the Martin-Hopkins  calculation, which is a validated novel method providing  better accuracy than the Friedewald equation in the  estimation of LDL-C.  Horald Pollen et al. Lenox Ahr. 9562;130(86): 2061-2068  (http://education.[employer].com/faq/FAQ164)    HDL  Date Value Ref Range Status  09/11/2022 69 > OR = 50 mg/dL Final   Triglycerides  Date Value Ref Range Status  09/11/2022 155 (H) <150 mg/dL Final          metoprolol succinate (TOPROL-XL) 25 MG 24 hr tablet [Pharmacy Med Name: Metoprolol Succinate ER 25 MG Oral Tablet Extended Release 24 Hour] 90 tablet 0    Sig: Take 1  tablet by mouth once daily     Cardiovascular:  Beta Blockers Failed - 06/06/2023  4:05 PM      Failed - Last BP in normal range    BP Readings from Last 1 Encounters:  05/03/23 (!) 148/75         Failed - Valid encounter within last 6 months    Recent Outpatient Visits           1 year ago Hypothyroidism, unspecified type   [facility] Donita Brooks, MD   1 year ago Encounter for Medicare annual wellness exam   [facility] Medicine Donita Brooks, MD   2 years ago Dry mouth, unspecified   [facility] Tanya Nones, Priscille Heidelberg, MD   4 years ago Benign essential HTN   [facility] Medicine Tanya Nones, Priscille Heidelberg, MD   5 years ago Benign essential HTN   [facility] Pickard, Priscille Heidelberg, MD              Passed - Last Heart Rate in normal range    Pulse Readings from Last 1 Encounters:  05/03/23 67          amLODipine (NORVASC) 10 MG tablet [  Pharmacy Med Name: amLODIPine Besylate 10 MG Oral Tablet] 90 tablet 0    Sig: Take 1 tablet by mouth once daily     Cardiovascular: Calcium Channel Blockers 2 Failed - 06/06/2023  4:05 PM      Failed - Last BP in normal range    BP Readings from Last 1 Encounters:  05/03/23 (!) 148/75         Failed - Valid encounter within last 6 months    Recent Outpatient Visits           1 year ago Hypothyroidism, unspecified type   [facility] Donita Brooks, MD   1 year ago Encounter for Medicare annual wellness exam   [facility] Medicine Donita Brooks, MD   2 years ago Dry mouth, unspecified   [facility] Tanya Nones, Priscille Heidelberg, MD   4 years ago Benign essential HTN   [facility] Medicine Tanya Nones Priscille Heidelberg, MD   5 years ago Benign essential HTN   [facility] Family Medicine Pickard, Priscille Heidelberg, MD              Passed - Last Heart Rate in normal range    Pulse Readings from Last 1 Encounters:  05/03/23 67

## 2023-07-04 ENCOUNTER — Other Ambulatory Visit: Payer: Self-pay | Admitting: Internal Medicine

## 2023-07-04 ENCOUNTER — Other Ambulatory Visit: Payer: Self-pay | Admitting: Family Medicine

## 2023-07-04 DIAGNOSIS — K2100 Gastro-esophageal reflux disease with esophagitis, without bleeding: Secondary | ICD-10-CM

## 2023-07-05 NOTE — Telephone Encounter (Signed)
Requested Prescriptions  Pending Prescriptions Disp Refills   levothyroxine (SYNTHROID) 25 MCG tablet [Pharmacy Med Name: Levothyroxine Sodium 25 MCG Oral Tablet] 90 tablet 0    Sig: Take 1 tablet by mouth once daily     Endocrinology:  Hypothyroid Agents Failed - 07/04/2023 10:08 AM      Failed - Valid encounter within last 12 months    Recent Outpatient Visits           1 year ago Hypothyroidism, unspecified type   [facility] Medicine Pickard, Priscille Heidelberg, MD   2 years ago Encounter for Medicare annual wellness exam   [facility] Medicine Donita Brooks, MD   2 years ago Dry mouth, unspecified   [facility] Medicine Tanya Nones, Priscille Heidelberg, MD   4 years ago Benign essential HTN   [facility] Tanya Nones, Priscille Heidelberg, MD   5 years ago Benign essential HTN   [facility] Medicine Pickard, Priscille Heidelberg, MD              Passed - TSH in normal range and within 360 days    TSH  Date Value Ref Range Status  09/11/2022 3.53 0.40 - 4.50 mIU/L Final

## 2023-09-11 ENCOUNTER — Ambulatory Visit: Payer: Self-pay | Admitting: Family Medicine

## 2023-09-11 NOTE — Telephone Encounter (Signed)
  Chief Complaint: cough Symptoms: productive cough x1 week. Pertinent Negatives: Patient denies bloody sputum, denies fever, denies SOB when not coughing, denies CP, denies recent travel. Disposition: [] ED /[x] Urgent Care (no appt availability in office) / [] Appointment(In office/virtual)/ []  [facility]/ [] Home Care/ [] Refused Recommended Disposition /[] [facility]/ []  Follow-up with PCP Additional Notes: advised to keep using OTC medications that have been working for her. Advised to call back if s/s worsen, if develops SOB advised to call 911.  Pt states she does not have a ride at this time, will go to UC later today when she has ride.  Copied from CRM [id number]. Topic: Clinical - Red Word Triage >> Sep 11, 2023  9:21 AM Almira Coaster wrote: Red Word that prompted transfer to Nurse Triage: Patient called with symptoms of an upper respiratory infection, coughing, difficulty breathing Reason for Disposition  [1] MILD difficulty breathing (e.g., minimal/no SOB at rest, SOB with walking, pulse <100) AND [2] still present when not coughing  Answer Assessment - Initial Assessment Questions 1. ONSET: "When did the cough begin?"      week 2. SEVERITY: "How bad is the cough today?"      Not as bad today 3. SPUTUM: "Describe the color of your sputum" (none, dry cough; clear, white, yellow, green)     white 4. HEMOPTYSIS: "Are you coughing up any blood?" If so ask: "How much?" (flecks, streaks, tablespoons, etc.)     denies 5. DIFFICULTY BREATHING: "Are you having difficulty breathing?" If Yes, ask: "How bad is it?" (e.g., mild, moderate, severe)    - MILD: No SOB at rest, mild SOB with walking, speaks normally in sentences, can lie down, no retractions, pulse < 100.    - MODERATE: SOB at rest, SOB with minimal exertion and prefers to sit, cannot lie down flat, speaks in phrases, mild retractions, audible wheezing, pulse 100-120.    - SEVERE: Very SOB at rest, speaks in single words,  struggling to breathe, sitting hunched forward, retractions, pulse > 120      Sometimes its hard to breath 6. FEVER: "Do you have a fever?" If Yes, ask: "What is your temperature, how was it measured, and when did it start?"     denies 7. CARDIAC HISTORY: "Do you have any history of heart disease?" (e.g., heart attack, congestive heart failure)      Denies 8. LUNG HISTORY: "Do you have any history of lung disease?"  (e.g., pulmonary embolus, asthma, emphysema)     denies 9. PE RISK FACTORS: "Do you have a history of blood clots?" (or: recent major surgery, recent prolonged travel, bedridden)     denies 10. OTHER SYMPTOMS: "Do you have any other symptoms?" (e.g., runny nose, wheezing, chest pain)       Wheezing  Protocols used: Cough - Acute Productive-A-AH

## 2023-09-20 ENCOUNTER — Encounter: Payer: Self-pay | Admitting: Family Medicine

## 2023-09-20 ENCOUNTER — Ambulatory Visit (INDEPENDENT_AMBULATORY_CARE_PROVIDER_SITE_OTHER): Payer: Medicare Other | Admitting: Family Medicine

## 2023-09-20 VITALS — BP 124/80 | HR 117 | Temp 97.6°F | Ht 60.0 in | Wt 184.1 lb

## 2023-09-20 DIAGNOSIS — Z23 Encounter for immunization: Secondary | ICD-10-CM | POA: Diagnosis not present

## 2023-09-20 DIAGNOSIS — I10 Essential (primary) hypertension: Secondary | ICD-10-CM | POA: Diagnosis not present

## 2023-09-20 DIAGNOSIS — E039 Hypothyroidism, unspecified: Secondary | ICD-10-CM

## 2023-09-20 NOTE — Progress Notes (Signed)
Subjective:    Patient ID: Joan Weaver, female    DOB: 1941/07/12, 82 y.o.   MRN: [id number]  Wheezing   Patient developed an upper respiratory infection around Thanksgiving.  Cough is productive of yellow mucus.  She reported wheezing and chest congestion.  She denies any chest pain shortness of breath or fever.  Took Mucinex DM and cough improved adequately.  She has an occasional cough primarily in the morning but she is doing much better than she was before.  She also has a history of hypertension.  Her blood pressure today is well-controlled at 124/80.  She is due for fasting labs.  She also has a history of hypothyroidism.  She is due to recheck her TSH.  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- Dr  Vassie Loll   Thyroid disease    Ulcer    Urinary incontinence    Past Surgical History:  Procedure Laterality Date   APPENDECTOMY     BREAST LUMPECTOMY     left   BRONCHOSCOPY     EYE SURGERY     left- cataract    HIATAL HERNIA REPAIR     x 2   HYSTEROSCOPY WITH D & C N/A 07/07/2013   Procedure: DILATATION AND CURETTAGE /HYSTEROSCOPY;  Surgeon: Dara Lords, MD;  Location: [facility];  Service: Gynecology;  Laterality: N/A;   KNEE SURGERY     Arthroscopic   left cataract surgery     TONSILLECTOMY     Current Outpatient Medications on File Prior to Visit  Medication Sig Dispense Refill   acetaminophen (TYLENOL) 500 MG tablet Take 500 mg by mouth  every 6 (six) hours as needed for pain.      amLODipine (NORVASC) 10 MG tablet Take 1 tablet by mouth once daily 90 tablet 0   BIOTIN FORTE PO Take 1 tablet by mouth daily.     CALCIUM PO Take 2 tablets by mouth daily.     Cholecalciferol (VITAMIN D) 1000 UNITS capsule Take 1,000 Units by mouth daily.      colestipol (COLESTID) 1 g tablet Take 1 tablet by mouth twice daily 180 tablet 0   cycloSPORINE (RESTASIS) 0.05 % ophthalmic emulsion 1 drop 2 (two) times daily.     dicyclomine (BENTYL) 10 MG capsule TAKE 1 CAPSULE BY MOUTH EVERY 6 HOURS AS NEEDED FOR SPASMS. 20 capsule 0   glucosamine-chondroitin 500-400 MG tablet Take 2 tablets by mouth daily.      levothyroxine (SYNTHROID) 25 MCG tablet Take 1 tablet by mouth once daily 90 tablet 0   loratadine (CLARITIN) 10 MG tablet Take 10 mg by mouth daily.       meclizine (ANTIVERT) 25 MG tablet Take 1 tablet (25 mg total) by mouth 3 (three) times  daily as needed for dizziness. 30 tablet 1   metoprolol succinate (TOPROL-XL) 25 MG 24 hr tablet Take 1 tablet by mouth once daily 90 tablet 0   Multiple Vitamin (MULTIVITAMIN) capsule Take 1 capsule by mouth daily.       pantoprazole (PROTONIX) 40 MG tablet TAKE 1 TABLET BY MOUTH TWICE DAILY BEFORE A MEAL 90 tablet 0   pyridOXINE (VITAMIN B-6) 100 MG tablet Take 100 mg by mouth daily.       raloxifene (EVISTA) 60 MG tablet Take 1 tablet (60 mg total) by mouth daily. 90 tablet 3   trimethoprim (TRIMPEX) 100 MG tablet Take 100 mg by mouth daily.     fish oil-omega-3 fatty acids 1000 MG capsule Take 2 g by mouth daily.  (Patient not taking: Reported on 09/20/2023)     Probiotic Product (ALIGN PO) Take by mouth. (Patient not taking: Reported on 09/20/2023)     No current facility-administered medications on file prior to visit.      Review of Systems  Respiratory:  Positive for wheezing.   All other systems reviewed and are negative.      Objective:   Physical Exam Vitals reviewed.   Constitutional:      General: She is not in acute distress.    Appearance: She is obese. She is not ill-appearing or toxic-appearing.  Cardiovascular:     Rate and Rhythm: Normal rate and regular rhythm.     Pulses: Normal pulses.     Heart sounds: Normal heart sounds. No murmur heard. Pulmonary:     Effort: Pulmonary effort is normal. No respiratory distress.     Breath sounds: Normal breath sounds. No stridor. No wheezing, rhonchi or rales.  Abdominal:     General: Abdomen is flat. Bowel sounds are normal. There is no distension.     Palpations: Abdomen is soft.     Tenderness: There is no abdominal tenderness. There is no guarding.  Musculoskeletal:     Right lower leg: No edema.     Left lower leg: No edema.  Neurological:     Mental Status: She is alert.           Assessment & Plan:  Hypothyroidism, unspecified type - Plan: CBC with Differential/Platelet, COMPLETE METABOLIC PANEL WITH GFR, TSH  Essential hypertension - Plan: CBC with Differential/Platelet, COMPLETE METABOLIC PANEL WITH GFR, TSH Physical exam today is normal.  I believe she had a viral upper respiratory infection.  No further treatment is necessary.  Blood pressure today is outstanding.  Check CBC CMP and a TSH.  Titrate levothyroxine to achieve a TSH within the therapeutic range.  Patient also received her flu shot today.

## 2023-09-21 LAB — COMPLETE METABOLIC PANEL WITH GFR
AG Ratio: 1.5 (calc) (ref 1.0–2.5)
ALT: 17 U/L (ref 6–29)
AST: 24 U/L (ref 10–35)
Albumin: 4.1 g/dL (ref 3.6–5.1)
Alkaline phosphatase (APISO): 59 U/L (ref 37–153)
BUN/Creatinine Ratio: 22 (calc) (ref 6–22)
BUN: 23 mg/dL (ref 7–25)
CO2: 26 mmol/L (ref 20–32)
Calcium: 9.8 mg/dL (ref 8.6–10.4)
Chloride: 102 mmol/L (ref 98–110)
Creat: 1.04 mg/dL — ABNORMAL HIGH (ref 0.60–0.95)
Globulin: 2.8 g/dL (ref 1.9–3.7)
Glucose, Bld: 111 mg/dL — ABNORMAL HIGH (ref 65–99)
Potassium: 4.3 mmol/L (ref 3.5–5.3)
Sodium: 139 mmol/L (ref 135–146)
Total Bilirubin: 0.3 mg/dL (ref 0.2–1.2)
Total Protein: 6.9 g/dL (ref 6.1–8.1)
eGFR: 53 mL/min/{1.73_m2} — ABNORMAL LOW (ref >=60)

## 2023-09-21 LAB — TSH: TSH: 3.19 m[IU]/L (ref 0.40–4.50)

## 2023-09-21 LAB — CBC WITH DIFFERENTIAL/PLATELET
Absolute Lymphocytes: 2446 {cells}/uL (ref 850–3900)
Absolute Monocytes: 810 {cells}/uL (ref 200–950)
Basophils Absolute: 79 {cells}/uL (ref 0–200)
Basophils Relative: 0.9 %
Eosinophils Absolute: 158 {cells}/uL (ref 15–500)
Eosinophils Relative: 1.8 %
HCT: 43.1 % (ref 35.0–45.0)
Hemoglobin: 14.5 g/dL (ref 11.7–15.5)
MCH: 29.8 pg (ref 27.0–33.0)
MCHC: 33.6 g/dL (ref 32.0–36.0)
MCV: 88.5 fL (ref 80.0–100.0)
MPV: 10.5 fL (ref 7.5–12.5)
Monocytes Relative: 9.2 %
Neutro Abs: 5306 {cells}/uL (ref 1500–7800)
Neutrophils Relative %: 60.3 %
Platelets: 279 10*3/uL (ref 140–400)
RBC: 4.87 10*6/uL (ref 3.80–5.10)
RDW: 12.7 % (ref 11.0–15.0)
Total Lymphocyte: 27.8 %
WBC: 8.8 10*3/uL (ref 3.8–10.8)

## 2023-09-25 ENCOUNTER — Other Ambulatory Visit: Payer: Self-pay | Admitting: Family Medicine

## 2023-09-25 DIAGNOSIS — I10 Essential (primary) hypertension: Secondary | ICD-10-CM

## 2023-10-09 ENCOUNTER — Other Ambulatory Visit: Payer: Self-pay | Admitting: Internal Medicine

## 2023-10-09 ENCOUNTER — Other Ambulatory Visit: Payer: Self-pay | Admitting: Family Medicine

## 2023-10-09 DIAGNOSIS — K2100 Gastro-esophageal reflux disease with esophagitis, without bleeding: Secondary | ICD-10-CM

## 2023-10-09 DIAGNOSIS — M810 Age-related osteoporosis without current pathological fracture: Secondary | ICD-10-CM

## 2023-10-11 ENCOUNTER — Other Ambulatory Visit: Payer: Self-pay

## 2023-10-11 DIAGNOSIS — K2100 Gastro-esophageal reflux disease with esophagitis, without bleeding: Secondary | ICD-10-CM

## 2023-10-11 MED ORDER — PANTOPRAZOLE SODIUM 40 MG PO TBEC: 40.0000 mg | DELAYED_RELEASE_TABLET | Freq: Two times a day (BID) | ORAL | 0 refills | Status: DC

## 2023-12-19 DIAGNOSIS — R350 Frequency of micturition: Secondary | ICD-10-CM | POA: Diagnosis not present

## 2023-12-19 DIAGNOSIS — N3000 Acute cystitis without hematuria: Secondary | ICD-10-CM | POA: Diagnosis not present

## 2023-12-27 ENCOUNTER — Other Ambulatory Visit: Payer: Self-pay | Admitting: Family Medicine

## 2023-12-27 DIAGNOSIS — I10 Essential (primary) hypertension: Secondary | ICD-10-CM

## 2023-12-28 ENCOUNTER — Telehealth: Payer: Self-pay | Admitting: Family Medicine

## 2023-12-28 ENCOUNTER — Other Ambulatory Visit: Payer: Self-pay

## 2023-12-28 DIAGNOSIS — I10 Essential (primary) hypertension: Secondary | ICD-10-CM

## 2023-12-28 MED ORDER — METOPROLOL SUCCINATE ER 25 MG PO TB24: 25.0000 mg | ORAL_TABLET | Freq: Every day | ORAL | 0 refills | Status: DC

## 2023-12-28 MED ORDER — AMLODIPINE BESYLATE 10 MG PO TABS: 10.0000 mg | ORAL_TABLET | Freq: Every day | ORAL | 0 refills | Status: DC

## 2023-12-28 NOTE — Telephone Encounter (Signed)
 Duplicate request, lasted refilled 12/28/23.  Requested Prescriptions  Pending Prescriptions Disp Refills   metoprolol succinate (TOPROL-XL) 25 MG 24 hr tablet [Pharmacy Med Name: Metoprolol Succinate ER 25 MG Oral Tablet Extended Release 24 Hour] 90 tablet 0    Sig: Take 1 tablet by mouth once daily     Cardiovascular:  Beta Blockers Failed - 12/28/2023  1:49 PM      Failed - Last Heart Rate in normal range    Pulse Readings from Last 1 Encounters:  09/20/23 (!) 117         Failed - Valid encounter within last 6 months    Recent Outpatient Visits           2 years ago Hypothyroidism, unspecified type   [facility] Pickard, Priscille Heidelberg, MD   2 years ago Encounter for Medicare annual wellness exam   [facility] Donita Brooks, MD   3 years ago Dry mouth, unspecified   [facility] Tanya Nones, Priscille Heidelberg, MD   4 years ago Benign essential HTN   [facility] Donita Brooks, MD   5 years ago Benign essential HTN   [facility] Pickard, Priscille Heidelberg, MD              Passed - Last BP in normal range    BP Readings from Last 1 Encounters:  09/20/23 124/80          amLODipine (NORVASC) 10 MG tablet [Pharmacy Med Name: amLODIPine Besylate 10 MG Oral Tablet] 90 tablet 0    Sig: Take 1 tablet by mouth once daily     Cardiovascular: Calcium Channel Blockers 2 Failed - 12/28/2023  1:49 PM      Failed - Last Heart Rate in normal range    Pulse Readings from Last 1 Encounters:  09/20/23 (!) 117         Failed - Valid encounter within last 6 months    Recent Outpatient Visits           2 years ago Hypothyroidism, unspecified type   [facility] Pickard, Priscille Heidelberg, MD   2 years ago Encounter for Medicare annual wellness exam   [facility] Donita Brooks, MD   3 years ago Dry mouth, unspecified   [facility] Tanya Nones, Priscille Heidelberg, MD   4 years ago Benign  essential HTN   [facility] Tanya Nones Priscille Heidelberg, MD   5 years ago Benign essential HTN   [facility] Pickard, Priscille Heidelberg, MD              Passed - Last BP in normal range    BP Readings from Last 1 Encounters:  09/20/23 124/80

## 2023-12-28 NOTE — Telephone Encounter (Signed)
 Prescription Request  12/28/2023  LOV: 09/20/2023  What is the name of the medication or equipment?   amLODipine (NORVASC) 10 MG tablet   metoprolol succinate (TOPROL-XL) 25 MG 24 hr tablet [[id number]]   Have you contacted your pharmacy to request a refill? Yes   Which pharmacy would you like this sent to?  [id number] - [place] ([place]), [place] - [address] [place] ([place]) [place] [address] Phone: [phone] Fax: [phone]    Patient notified that their request is being sent to the clinical staff for review and that they should receive a response within 2 business days.   Please advise pharmacist.

## 2024-01-17 ENCOUNTER — Other Ambulatory Visit: Payer: Self-pay | Admitting: Family Medicine

## 2024-01-17 ENCOUNTER — Telehealth: Payer: Self-pay

## 2024-01-17 DIAGNOSIS — M810 Age-related osteoporosis without current pathological fracture: Secondary | ICD-10-CM

## 2024-01-17 NOTE — Telephone Encounter (Signed)
 Copied from CRM [id number]. Topic: Clinical - Medication Question >> Jan 17, 2024 12:20 PM Geroge Baseman wrote: Reason for CRM: patient states tat dr pickard used to prescribe her pantoprazole (PROTONIX) 40 MG tablet, she said her gastro doctor had taken it over but she is wanting to know if dr pickard would be able to begin prescribing this for her again. Please advise.

## 2024-01-17 NOTE — Telephone Encounter (Unsigned)
 Copied from CRM [id number]. Topic: Clinical - Medication Refill >> Jan 17, 2024 12:18 PM Geroge Baseman wrote: Most Recent Primary Care Visit:  Provider: Lynnea Ferrier T  Department: [facility]  Visit Type: ACUTE  Date: 09/20/2023  Medication: levothyroxine (SYNTHROID) 25 MCG tablet, raloxifene (EVISTA) 60 MG tablet  Has the patient contacted their pharmacy? Yes (Agent: If no, request that the patient contact the pharmacy for the refill. If patient does not wish to contact the pharmacy document the reason why and proceed with request.) (Agent: If yes, when and what did the pharmacy advise?)  Is this the correct pharmacy for this prescription? Yes If no, delete pharmacy and type the correct one.  This is the patient's preferred pharmacy:  [id number] - [place] ([place]), [place] - [address] [place] ([place]) [place] [address] Phone: [phone] Fax: [phone]   Has the prescription been filled recently? No  Is the patient out of the medication? Yes  Has the patient been seen for an appointment in the last year OR does the patient have an upcoming appointment? Yes  Can we respond through MyChart? No  Agent: Please be advised that Rx refills may take up to 3 business days. We ask that you follow-up with your pharmacy.

## 2024-01-18 ENCOUNTER — Other Ambulatory Visit: Payer: Self-pay

## 2024-01-18 DIAGNOSIS — K2100 Gastro-esophageal reflux disease with esophagitis, without bleeding: Secondary | ICD-10-CM

## 2024-01-18 DIAGNOSIS — M810 Age-related osteoporosis without current pathological fracture: Secondary | ICD-10-CM

## 2024-01-18 MED ORDER — LEVOTHYROXINE SODIUM 25 MCG PO TABS: 25.0000 ug | ORAL_TABLET | Freq: Every day | ORAL | 0 refills | Status: DC

## 2024-01-18 MED ORDER — RALOXIFENE HCL 60 MG PO TABS: 60.0000 mg | ORAL_TABLET | Freq: Every day | ORAL | 0 refills | Status: DC

## 2024-01-18 MED ORDER — PANTOPRAZOLE SODIUM 40 MG PO TBEC
40.0000 mg | DELAYED_RELEASE_TABLET | Freq: Two times a day (BID) | ORAL | 0 refills | Status: DC
Start: 2024-01-18 — End: 2024-07-10

## 2024-01-18 NOTE — Addendum Note (Signed)
 Addended by: Renee Pain on: 01/18/2024 10:15 AM   Modules accepted: Orders

## 2024-01-18 NOTE — Telephone Encounter (Signed)
 Refilled 01/18/24, duplicate request.  Requested Prescriptions  Pending Prescriptions Disp Refills   levothyroxine (SYNTHROID) 25 MCG tablet 90 tablet 0    Sig: Take 1 tablet (25 mcg total) by mouth daily.     Endocrinology:  Hypothyroid Agents Failed - 01/18/2024 10:29 AM      Failed - Valid encounter within last 12 months    Recent Outpatient Visits           4 months ago Hypothyroidism, unspecified type   [facility] Donita Brooks, MD   1 year ago Benign essential HTN   [facility] Pickard, Priscille Heidelberg, MD              Passed - TSH in normal range and within 360 days    TSH  Date Value Ref Range Status  09/20/2023 3.19 0.40 - 4.50 mIU/L Final          raloxifene (EVISTA) 60 MG tablet 90 tablet 0    Sig: Take 1 tablet (60 mg total) by mouth daily.     OB/GYN: Selective Estrogen Receptor Modulators 2 Failed - 01/18/2024 10:29 AM      Failed - Valid encounter within last 12 months    Recent Outpatient Visits           4 months ago Hypothyroidism, unspecified type   [facility] Pickard, Priscille Heidelberg, MD   1 year ago Benign essential HTN    [facility] Pickard, Priscille Heidelberg, MD              Failed - Bone Mineral Density or Dexa Scan completed in the last 2 years      Passed - Ca in normal range and within 360 days    Calcium  Date Value Ref Range Status  09/20/2023 9.8 8.6 - 10.4 mg/dL Final  60/45/4098 9.4 8.4 - 10.4 mg/dL Final   Calcium, Ion  Date Value Ref Range Status  05/11/2010 1.15 1.12 - 1.32 mmol/L Final

## 2024-04-01 DIAGNOSIS — Z1231 Encounter for screening mammogram for malignant neoplasm of breast: Secondary | ICD-10-CM | POA: Diagnosis not present

## 2024-04-01 LAB — HM MAMMOGRAPHY

## 2024-04-03 ENCOUNTER — Other Ambulatory Visit: Payer: Self-pay | Admitting: Family Medicine

## 2024-04-03 DIAGNOSIS — I10 Essential (primary) hypertension: Secondary | ICD-10-CM

## 2024-04-04 ENCOUNTER — Encounter: Payer: Self-pay | Admitting: Family Medicine

## 2024-04-07 ENCOUNTER — Telehealth: Payer: Self-pay | Admitting: *Deleted

## 2024-04-07 NOTE — Telephone Encounter (Signed)
 Called pt to make aware that MM was negative for malignancy. Pt verbalized understanding and continue to f/u as scheduled.

## 2024-04-10 ENCOUNTER — Ambulatory Visit: Payer: Self-pay

## 2024-04-10 NOTE — Telephone Encounter (Signed)
 FYI Only or Action Required?: FYI only for provider.  Patient was last seen in primary care on 09/20/2023 by Joan Weaver DASEN, MD. Called Nurse Triage reporting Fatigue. Symptoms began several days ago. Interventions attempted: Nothing. Symptoms are: gradually worsening.  Triage Disposition: See HCP Within 4 Hours (Or PCP Triage) See note.   Patient/caregiver understands and will follow disposition?: Yes      Copied from CRM [id number]. Topic: Clinical - Red Word Triage >> Apr 10, 2024  3:36 PM Elle L wrote: Red Word that prompted transfer to Nurse Triage: The patient is experiencing severe weakness. Reason for Disposition  [1] MODERATE weakness or fatigue AND [2] from poor fluid intake AND [3] no improvement after 2 hours of rest and fluids  Answer Assessment - Initial Assessment Questions 1. DESCRIPTION: Describe how you are feeling.     ------------ Weak    2. SEVERITY: How bad is it?  Can you stand and walk?   - MILD (0-3): Feels weak or tired, but does not interfere with work, school or normal activities.   - MODERATE (4-7): Able to stand and walk; weakness interferes with work, school, or normal activities.   - SEVERE (8-10): Unable to stand or walk; unable to do usual activities.       ------Moderate : Walks with her walker, but still feeling week.   3. ONSET: When did these symptoms begin? (e.g., hours, days, weeks, months)    -------------- 2 weeks ago    4. CAUSE: What do you think is causing the weakness or fatigue? (e.g., not drinking enough fluids, medical problem, trouble sleeping)      ------------------- Diarrhea ( last episode: Tuesday)   6. OTHER SYMPTOMS: Do you have any other symptoms? (e.g., chest pain, fever, cough, SOB, vomiting, diarrhea, bleeding, other areas of pain)     ------ Appetite is decreasing  ------------ Diarrhea ( chronic): had a few episodes a few weeks ago. Last bout of diarrhea was Tuesday.      Additional  Staying  hydrated ( one bottle 16oz) + drinking Boost daily  Attempted to schedule an appointment within the dispo time frame--none available.   Referred pt to local UC--- declined d/t transportation issues.   Appointment scheduled for patient appropriately.  Patient educated on pertinent s/s that would warrant emergent help/call 911/ ED/UC.  Patient verbalized understanding and agrees with plan . No additional questions/concerns noted during the time of the call.  Protocols used: Weakness (Generalized) and Fatigue-A-AH

## 2024-04-15 ENCOUNTER — Ambulatory Visit: Admitting: Family Medicine

## 2024-04-17 ENCOUNTER — Ambulatory Visit (INDEPENDENT_AMBULATORY_CARE_PROVIDER_SITE_OTHER): Admitting: Family Medicine

## 2024-04-17 ENCOUNTER — Encounter: Payer: Self-pay | Admitting: Family Medicine

## 2024-04-17 VITALS — BP 114/68 | HR 75 | Temp 98.6°F | Ht 60.0 in | Wt 181.2 lb

## 2024-04-17 DIAGNOSIS — R5383 Other fatigue: Secondary | ICD-10-CM

## 2024-04-17 NOTE — Progress Notes (Signed)
 Subjective:    Patient ID: Joan Weaver, female    DOB: 04-19-1941, 83 y.o.   MRN: [id number]  Patient complains of fatigue.  She states that she just does not have enough energy to take care of herself.  She lives independently.  However it is difficult for her to take her own showers and baths.  It is difficult for her to take care of her home and clean.  She recently injured her right shoulder.  She has pain with abduction greater than 90 degrees.  This makes it hard for her to get dressed.  She has minimal pain with Hawkins maneuver and slight pain with empty can testing.  However abduction greater than 90 degrees elicits significant pain.  She is interested in moving into an assisted living facility.  She wanted to discuss her options in this regard.  She denies any chest pain or shortness of breath.  Instead has muscle weakness and deconditioning that seems to be her biggest issue.  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- Dr  Jude   Thyroid  disease    Ulcer    Urinary incontinence    Past Surgical History:  Procedure Laterality Date   APPENDECTOMY     BREAST LUMPECTOMY     left   BRONCHOSCOPY     EYE SURGERY     left- cataract    HIATAL HERNIA REPAIR     x 2   HYSTEROSCOPY WITH D & C N/A 07/07/2013   Procedure: DILATATION AND CURETTAGE /HYSTEROSCOPY;  Surgeon: Evalene SHAUNNA Organ, MD;  Location: [facility];  Service: Gynecology;  Laterality: N/A;   KNEE SURGERY     Arthroscopic    left cataract surgery     TONSILLECTOMY     Current Outpatient Medications on File Prior to Visit  Medication Sig Dispense Refill   acetaminophen  (TYLENOL ) 500 MG tablet Take 500 mg by mouth every 6 (six) hours as needed for pain.      amLODipine  (NORVASC ) 10 MG tablet Take 1 tablet by mouth once daily 90 tablet 0   BIOTIN FORTE PO Take 1 tablet by mouth daily.     CALCIUM PO Take 2 tablets by mouth daily.     cephALEXin  (KEFLEX ) 250 MG capsule Take 250 mg by mouth daily.     Cholecalciferol (VITAMIN D ) 1000 UNITS capsule Take 1,000 Units by mouth daily.      colestipol  (COLESTID ) 1 g tablet Take 1 tablet by mouth twice daily 180 tablet 0   cycloSPORINE (RESTASIS) 0.05 % ophthalmic emulsion 1 drop 2 (two) times daily.     dicyclomine  (BENTYL ) 10 MG capsule TAKE 1 CAPSULE BY MOUTH EVERY 6 HOURS AS NEEDED FOR SPASMS. 20 capsule 0   glucosamine-chondroitin 500-400 MG tablet Take 2 tablets by mouth daily.  levothyroxine  (SYNTHROID ) 25 MCG tablet Take 1 tablet (25 mcg total) by mouth daily. 90 tablet 0   loratadine (CLARITIN) 10 MG tablet Take 10 mg by mouth daily.       meclizine  (ANTIVERT ) 25 MG tablet Take 1 tablet (25 mg total) by mouth 3 (three) times daily as needed for dizziness. 30 tablet 1   metoprolol  succinate (TOPROL -XL) 25 MG 24 hr tablet Take 1 tablet by mouth once daily 90 tablet 0   Multiple Vitamin (MULTIVITAMIN) capsule Take 1 capsule by mouth daily.       nitrofurantoin (MACRODANTIN) 100 MG capsule Take 100 mg by mouth daily.     pantoprazole  (PROTONIX ) 40 MG tablet Take 1 tablet (40 mg total) by mouth 2 (two) times daily before a meal. 90 tablet 0   pyridOXINE (VITAMIN B-6) 100 MG tablet Take 100 mg by mouth daily.       raloxifene  (EVISTA ) 60 MG tablet Take 1 tablet (60 mg total) by mouth daily. 90 tablet 0   trimethoprim  (TRIMPEX ) 100 MG tablet Take 100 mg by mouth daily.     fish oil-omega-3 fatty acids 1000 MG capsule Take 2 g by mouth daily.  (Patient not taking:  Reported on 04/17/2024)     Probiotic Product (ALIGN PO) Take by mouth. (Patient not taking: Reported on 04/17/2024)     No current facility-administered medications on file prior to visit.      Review of Systems  Respiratory:  Positive for wheezing.   All other systems reviewed and are negative.      Objective:   Physical Exam Vitals reviewed.  Constitutional:      General: She is not in acute distress.    Appearance: She is obese. She is not ill-appearing or toxic-appearing.  Cardiovascular:     Rate and Rhythm: Normal rate and regular rhythm.     Pulses: Normal pulses.     Heart sounds: Normal heart sounds. No murmur heard. Pulmonary:     Effort: Pulmonary effort is normal. No respiratory distress.     Breath sounds: Normal breath sounds. No stridor. No wheezing, rhonchi or rales.  Abdominal:     General: Abdomen is flat. Bowel sounds are normal. There is no distension.     Palpations: Abdomen is soft.     Tenderness: There is no abdominal tenderness. There is no guarding.  Musculoskeletal:     Right shoulder: Decreased range of motion.     Right lower leg: No edema.     Left lower leg: No edema.  Neurological:     Mental Status: She is alert.           Assessment & Plan:  Fatigue, unspecified type - Plan: CBC with Differential/Platelet, Comprehensive metabolic panel with GFR, TSH, Vitamin B12 Physical exam today is normal.  I believe her fatigue is multifactorial.  I believe a lot of this is age-related and deconditioning.  We discussed transitioning to an assisted living facility.  I will check a CBC CMP TSH and a B12 level to evaluate for reversible causes of fatigue.  I wrote a prescription for a shower transfer bench as well as a rolling walker to facilitate getting around her home and performing bathing.  Offer the patient a cortisone injection in her right shoulder which she declined

## 2024-04-18 ENCOUNTER — Ambulatory Visit: Payer: Self-pay | Admitting: Family Medicine

## 2024-04-18 LAB — CBC WITH DIFFERENTIAL/PLATELET
Absolute Lymphocytes: 2950 {cells}/uL (ref 850–3900)
Absolute Monocytes: 791 {cells}/uL (ref 200–950)
Basophils Absolute: 69 {cells}/uL (ref 0–200)
Basophils Relative: 0.8 %
Eosinophils Absolute: 163 {cells}/uL (ref 15–500)
Eosinophils Relative: 1.9 %
HCT: 42.7 % (ref 35.0–45.0)
Hemoglobin: 14.1 g/dL (ref 11.7–15.5)
MCH: 29.5 pg (ref 27.0–33.0)
MCHC: 33 g/dL (ref 32.0–36.0)
MCV: 89.3 fL (ref 80.0–100.0)
MPV: 10.9 fL (ref 7.5–12.5)
Monocytes Relative: 9.2 %
Neutro Abs: 4627 {cells}/uL (ref 1500–7800)
Neutrophils Relative %: 53.8 %
Platelets: 279 Thousand/uL (ref 140–400)
RBC: 4.78 Million/uL (ref 3.80–5.10)
RDW: 13 % (ref 11.0–15.0)
Total Lymphocyte: 34.3 %
WBC: 8.6 Thousand/uL (ref 3.8–10.8)

## 2024-04-18 LAB — COMPREHENSIVE METABOLIC PANEL WITH GFR
AG Ratio: 1.4 (calc) (ref 1.0–2.5)
ALT: 15 U/L (ref 6–29)
AST: 20 U/L (ref 10–35)
Albumin: 4.1 g/dL (ref 3.6–5.1)
Alkaline phosphatase (APISO): 59 U/L (ref 37–153)
BUN: 23 mg/dL (ref 7–25)
CO2: 27 mmol/L (ref 20–32)
Calcium: 9.4 mg/dL (ref 8.6–10.4)
Chloride: 100 mmol/L (ref 98–110)
Creat: 0.93 mg/dL (ref 0.60–0.95)
Globulin: 3 g/dL (ref 1.9–3.7)
Glucose, Bld: 91 mg/dL (ref 65–99)
Potassium: 4.6 mmol/L (ref 3.5–5.3)
Sodium: 138 mmol/L (ref 135–146)
Total Bilirubin: 0.4 mg/dL (ref 0.2–1.2)
Total Protein: 7.1 g/dL (ref 6.1–8.1)
eGFR: 61 mL/min/1.73m2 (ref >=60)

## 2024-04-18 LAB — VITAMIN B12: Vitamin B-12: 394 pg/mL (ref 200–1100)

## 2024-04-18 LAB — TSH: TSH: 1.85 m[IU]/L (ref 0.40–4.50)

## 2024-04-22 ENCOUNTER — Ambulatory Visit: Admitting: Family Medicine

## 2024-04-23 ENCOUNTER — Other Ambulatory Visit: Payer: Self-pay | Admitting: Family Medicine

## 2024-04-23 DIAGNOSIS — M810 Age-related osteoporosis without current pathological fracture: Secondary | ICD-10-CM

## 2024-04-24 ENCOUNTER — Other Ambulatory Visit: Payer: Self-pay

## 2024-04-24 DIAGNOSIS — M810 Age-related osteoporosis without current pathological fracture: Secondary | ICD-10-CM

## 2024-04-24 MED ORDER — LEVOTHYROXINE SODIUM 25 MCG PO TABS: 25.0000 ug | ORAL_TABLET | Freq: Every day | ORAL | 1 refills | Status: AC

## 2024-04-24 MED ORDER — RALOXIFENE HCL 60 MG PO TABS: 60.0000 mg | ORAL_TABLET | Freq: Every day | ORAL | 1 refills | Status: AC

## 2024-04-24 NOTE — Telephone Encounter (Signed)
 Requested medication (s) are due for refill today:   Yes for both  Requested medication (s) are on the active medication list:   Yes for both  Future visit scheduled:   No.    LOV 04/17/2024 with Dr. Duanne   Last ordered: Both 01/18/2024 #90, 0 refills  Unable to refill because bone density scan due per protocol.     Requested Prescriptions  Pending Prescriptions Disp Refills   raloxifene  (EVISTA ) 60 MG tablet [Pharmacy Med Name: Raloxifene  HCl 60 MG Oral Tablet] 90 tablet 0    Sig: Take 1 tablet by mouth once daily     OB/GYN: Selective Estrogen Receptor Modulators 2 Failed - 04/24/2024  2:45 PM      Failed - Valid encounter within last 12 months    Recent Outpatient Visits           1 week ago Fatigue, unspecified type   [place] [facility] Duanne Butler DASEN, MD   7 months ago Hypothyroidism, unspecified type   [place] [facility] Duanne Butler DASEN, MD   1 year ago Benign essential HTN   [place] [facility] Pickard, Butler DASEN, MD              Failed - Bone Mineral Density or Dexa Scan completed in the last 2 years      Passed - Ca in normal range and within 360 days    Calcium  Date Value Ref Range Status  04/17/2024 9.4 8.6 - 10.4 mg/dL Final  88/87/7984 9.4 8.4 - 10.4 mg/dL Final   Calcium, Ion  Date Value Ref Range Status  05/11/2010 1.15 1.12 - 1.32 mmol/L Final          levothyroxine  (SYNTHROID ) 25 MCG tablet [Pharmacy Med Name: Levothyroxine  Sodium 25 MCG Oral Tablet] 90 tablet 0    Sig: Take 1 tablet by mouth once daily     Endocrinology:  Hypothyroid Agents Failed - 04/24/2024  2:45 PM      Failed - Valid encounter within last 12 months    Recent Outpatient Visits           1 week ago Fatigue, unspecified type   [place] [facility] Duanne Butler DASEN, MD   7 months ago Hypothyroidism, unspecified type   [facility] Duanne, Butler DASEN, MD    1 year ago Benign essential HTN   [facility] Pickard, Butler DASEN, MD              Passed - TSH in normal range and within 360 days    TSH  Date Value Ref Range Status  04/17/2024 1.85 0.40 - 4.50 mIU/L Final

## 2024-05-02 ENCOUNTER — Inpatient Hospital Stay: Payer: Medicare Other | Admitting: Adult Health

## 2024-05-26 ENCOUNTER — Ambulatory Visit (HOSPITAL_BASED_OUTPATIENT_CLINIC_OR_DEPARTMENT_OTHER): Admitting: Family Medicine

## 2024-05-30 ENCOUNTER — Encounter: Payer: Self-pay | Admitting: Adult Health

## 2024-05-30 ENCOUNTER — Inpatient Hospital Stay: Attending: Adult Health | Admitting: Adult Health

## 2024-05-30 VITALS — BP 128/76 | HR 85 | Temp 98.2°F | Resp 18 | Wt 178.3 lb

## 2024-05-30 DIAGNOSIS — Z170 Estrogen receptor positive status [ER+]: Secondary | ICD-10-CM | POA: Diagnosis not present

## 2024-05-30 DIAGNOSIS — Z800 Family history of malignant neoplasm of digestive organs: Secondary | ICD-10-CM | POA: Diagnosis not present

## 2024-05-30 DIAGNOSIS — C50512 Malignant neoplasm of lower-outer quadrant of left female breast: Secondary | ICD-10-CM | POA: Diagnosis not present

## 2024-05-30 DIAGNOSIS — Z803 Family history of malignant neoplasm of breast: Secondary | ICD-10-CM | POA: Diagnosis not present

## 2024-05-30 DIAGNOSIS — R197 Diarrhea, unspecified: Secondary | ICD-10-CM | POA: Diagnosis not present

## 2024-05-30 DIAGNOSIS — Z853 Personal history of malignant neoplasm of breast: Secondary | ICD-10-CM | POA: Diagnosis not present

## 2024-05-30 DIAGNOSIS — K224 Dyskinesia of esophagus: Secondary | ICD-10-CM | POA: Diagnosis not present

## 2024-05-30 DIAGNOSIS — Z8042 Family history of malignant neoplasm of prostate: Secondary | ICD-10-CM | POA: Diagnosis not present

## 2024-05-30 DIAGNOSIS — Z923 Personal history of irradiation: Secondary | ICD-10-CM | POA: Diagnosis not present

## 2024-05-30 DIAGNOSIS — K5900 Constipation, unspecified: Secondary | ICD-10-CM | POA: Diagnosis not present

## 2024-05-30 DIAGNOSIS — Z08 Encounter for follow-up examination after completed treatment for malignant neoplasm: Secondary | ICD-10-CM | POA: Diagnosis not present

## 2024-05-30 NOTE — Progress Notes (Signed)
  [facility] Cancer Follow up:    Joan Weaver DASEN, MD [address] [place] [address]   DIAGNOSIS: History of left breast cancer.    SUMMARY OF ONCOLOGIC HISTORY: Oncology History  Breast cancer, left (HCC)  05/11/2010 Surgery   Left breast lumpectomy: Invasive ductal carcinoma 6 mm, grade 2, margins negative, 0/2 sentinel nodes, ER 98%, PR 97%, HER-2 negative, Ki-67 18%, T1b N0 M0 stage IA   05/30/2010 - 07/06/2010 Radiation Therapy   Adjuvant radiation by Dr. Dewey   07/08/2010 - 12/29/2015 Anti-estrogen oral therapy   Tamoxifen  20 mg daily     CURRENT THERAPY: observation  INTERVAL HISTORY:  Discussed the use of AI scribe software for clinical note transcription with the patient, who gave verbal consent to proceed.  History of Present Illness Joan Weaver is an 83 year old female with a history of left breast invasive ductal carcinoma who presents for follow-up.  She was diagnosed with left-sided, left breast invasive ductal carcinoma, ER, PR positive, HER2 negative, in 2011. She underwent a lumpectomy, adjuvant radiation, and completed antiestrogen therapy with tamoxifen  by March 2017. She remains on observation alone with annual mammograms. The most recent mammogram on April 01, 2024, shows no mammographic evidence of malignancy and breast density category B.     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- Dr  Jude   Thyroid  disease    Ulcer    Urinary incontinence     SURGICAL HISTORY: Past Surgical History:  Procedure Laterality Date   APPENDECTOMY     BREAST LUMPECTOMY     left   BRONCHOSCOPY     EYE SURGERY     left- cataract    HIATAL HERNIA REPAIR     x 2   HYSTEROSCOPY WITH D & C N/A 07/07/2013   Procedure: DILATATION AND CURETTAGE /HYSTEROSCOPY;  Surgeon: Evalene SHAUNNA Organ, MD;  Location: [facility];  Service: Gynecology;  Laterality: N/A;   KNEE SURGERY     Arthroscopic   left cataract surgery     TONSILLECTOMY      SOCIAL HISTORY: Social History   Socioeconomic History   Marital status: Widowed    Spouse name: Not on file  Number of children: 0   Years of education: Not on file   Highest education level: Not on file  Occupational History   Occupation: Retired     Comment: [employer]  Tobacco Use   Smoking status: Never   Smokeless tobacco: Never  Vaping Use   Vaping status: Never Used  Substance and Sexual Activity   Alcohol use: Never   Drug use: Never   Sexual activity: Not Currently    Birth control/protection: Post-menopausal  Other Topics Concern   Not on file  Social History Narrative   Not on file   Social Drivers of Health   Financial Resource Strain: Not on file  Food Insecurity: Not on file   Transportation Needs: Not on file  Physical Activity: Inactive (03/31/2021)   Exercise Vital Sign    Days of Exercise per Week: 0 days    Minutes of Exercise per Session: 0 min  Stress: Not on file  Social Connections: Not on file  Intimate Partner Violence: Not At Risk (03/31/2021)   Humiliation, Afraid, Rape, and Kick questionnaire    Fear of Current or Ex-Partner: No    Emotionally Abused: No    Physically Abused: No    Sexually Abused: No    FAMILY HISTORY: Family History  Problem Relation Age of Onset   Rheum arthritis Mother    Breast cancer Maternal Aunt 19   Pancreatic cancer Paternal Aunt    Cancer Paternal Aunt    Stomach cancer Paternal Uncle    Heart disease Maternal Grandmother    Prostate cancer Maternal Grandfather    Cancer Paternal Grandfather        Stomach cancer   Colon cancer Neg Hx    Esophageal cancer Neg Hx     Review of Systems  Constitutional:  Negative for appetite change, chills, fatigue, fever and unexpected weight change.  HENT:   Negative for hearing loss, lump/mass and trouble swallowing.   Eyes:  Negative for eye problems and icterus.  Respiratory:  Negative for chest tightness, cough and shortness of breath.   Cardiovascular:  Negative for chest pain, leg swelling and palpitations.  Gastrointestinal:  Negative for abdominal distention, abdominal pain, constipation, diarrhea, nausea and vomiting.  Endocrine: Negative for hot flashes.  Genitourinary:  Negative for difficulty urinating.   Musculoskeletal:  Negative for arthralgias.  Skin:  Negative for itching and rash.  Neurological:  Negative for dizziness, extremity weakness, headaches and numbness.  Hematological:  Negative for adenopathy. Does not bruise/bleed easily.  Psychiatric/Behavioral:  Negative for depression. The patient is not nervous/anxious.       PHYSICAL EXAMINATION    Vitals:   05/30/24 1303  BP: 128/76  Pulse: 85  Resp: 18  Temp: 98.2 F (36.8 C)  SpO2: 96%     Physical Exam Constitutional:      General: She is not in acute distress.    Appearance: Normal appearance. She is not toxic-appearing.  HENT:     Head: Normocephalic and atraumatic.     Mouth/Throat:     Mouth: Mucous membranes are moist.     Pharynx: Oropharynx is clear. No oropharyngeal exudate or posterior oropharyngeal erythema.  Eyes:     General: No scleral icterus. Cardiovascular:     Rate and Rhythm: Normal rate and regular rhythm.     Pulses: Normal pulses.     Heart sounds: Normal heart sounds.  Pulmonary:     Effort: Pulmonary effort is normal.     Breath sounds: Normal breath sounds.  Chest:     Comments: Left breast s/p lumpectomy and radiation, no sign of local recurrence, right breast benign Abdominal:     General: Abdomen is flat. Bowel sounds are normal. There is no distension.     Palpations: Abdomen is soft.     Tenderness: There is no abdominal tenderness.  Musculoskeletal:        General: No swelling.     Cervical back: Neck supple.  Lymphadenopathy:     Cervical: No cervical adenopathy.     Upper Body:     Right upper body: No supraclavicular or axillary adenopathy.     Left upper body: No supraclavicular or axillary adenopathy.  Skin:    General: Skin is warm and dry.     Findings: No rash.  Neurological:     General: No focal deficit present.     Mental Status: She is alert.  Psychiatric:        Mood and Affect: Mood normal.        Behavior: Behavior normal.        ASSESSMENT and THERAPY PLAN:   Assessment and Plan Assessment & Plan History of left breast invasive ductal carcinoma, status post lumpectomy, adjuvant radiation, and antiestrogen therapy, currently without evidence of recurrence ER, PR positive, HER2 negative, diagnosed in 2011. Status post lumpectomy, adjuvant radiation, and completed antiestrogen therapy with tamoxifen . Currently on observation alone. Most recent mammogram on April 01, 2024, showed no mammographic  evidence of malignancy and breast density category B. - Continue annual mammograms, next due in June 2026. - Continue follow-up with primary care provider, Dr. Duanne. - Instruct to report any new breast concerns immediately.  Chronic diarrhea and constipation (bowel dysfunction) Chronic bowel dysfunction with alternating diarrhea and constipation. Colestipol  used for diarrhea management, withheld during constipation. Generic anti-diarrheal from [employer] more effective than Imodium. Potential pancreatic enzyme insufficiency noted, but enzyme replacement therapy is cost-prohibitive. - Continue current management with colestipol , adjusting dosage based on bowel movement frequency. - Consider follow-up with gastroenterologist for further management of bowel dysfunction. - Avoid starting new treatments like Benefiber or Metamucil until consulting with gastroenterologist. - Monitor for signs of dehydration and adjust fluid intake accordingly.  Esophageal dysmotility (esophageal spasm) Esophageal dysmotility with spasms causing difficulty in swallowing and occasional severe pain. Peppermint (Altoids) used to manage spasms effectively. - Continue using peppermint (Altoids) for esophageal spasms.  RTC in one year for continued long term follow up.       All questions were answered. The patient knows to call the clinic with any problems, questions or concerns. We can certainly see the patient much sooner if necessary.  Total encounter time:30 minutes*in face-to-face visit time, chart review, lab review, care coordination, order entry, and documentation of the encounter time.    Morna Kendall, NP 06/03/24 1:13 PM Medical Oncology and Hematology [facility] [address] [place], [place] [address] Tel. [phone]    Fax. [phone]  *Total Encounter Time as defined by the Centers for Medicare and Medicaid Services includes, in addition to the face-to-face time of a patient  visit (documented in the note above) non-face-to-face time: obtaining and reviewing outside history, ordering and reviewing medications, tests or procedures, care coordination (communications with other health care professionals or caregivers) and documentation in the medical record.

## 2024-07-09 ENCOUNTER — Other Ambulatory Visit: Payer: Self-pay | Admitting: Family Medicine

## 2024-07-09 DIAGNOSIS — I10 Essential (primary) hypertension: Secondary | ICD-10-CM

## 2024-07-09 DIAGNOSIS — K2100 Gastro-esophageal reflux disease with esophagitis, without bleeding: Secondary | ICD-10-CM

## 2025-06-01 ENCOUNTER — Encounter: Admitting: Adult Health
